# Patient Record
Sex: Male | Born: 1960 | Race: White | Hispanic: No | Marital: Single | Smoking: Former smoker
Health system: Southern US, Community
[De-identification: ages and names within clinical notes are randomized; demographics above are authoritative.]

## PROBLEM LIST (undated history)

## (undated) DIAGNOSIS — Z720 Tobacco use: Secondary | ICD-10-CM

## (undated) DIAGNOSIS — Z7289 Other problems related to lifestyle: Secondary | ICD-10-CM

## (undated) DIAGNOSIS — F1090 Alcohol use, unspecified, uncomplicated: Secondary | ICD-10-CM

## (undated) DIAGNOSIS — E785 Hyperlipidemia, unspecified: Secondary | ICD-10-CM

## (undated) DIAGNOSIS — Z789 Other specified health status: Secondary | ICD-10-CM

## (undated) DIAGNOSIS — K219 Gastro-esophageal reflux disease without esophagitis: Secondary | ICD-10-CM

## (undated) DIAGNOSIS — I739 Peripheral vascular disease, unspecified: Secondary | ICD-10-CM

## (undated) DIAGNOSIS — I10 Essential (primary) hypertension: Secondary | ICD-10-CM

## (undated) HISTORY — DX: Hyperlipidemia, unspecified: E78.5

## (undated) HISTORY — DX: Peripheral vascular disease, unspecified: I73.9

## (undated) HISTORY — DX: Gastro-esophageal reflux disease without esophagitis: K21.9

## (undated) HISTORY — DX: Tobacco use: Z72.0

## (undated) HISTORY — PX: WISDOM TOOTH EXTRACTION: SHX21

---

## 2001-04-18 ENCOUNTER — Emergency Department (HOSPITAL_COMMUNITY): Admission: EM | Admit: 2001-04-18 | Discharge: 2001-04-18 | Payer: Self-pay | Admitting: Emergency Medicine

## 2020-06-08 DIAGNOSIS — Z5189 Encounter for other specified aftercare: Secondary | ICD-10-CM

## 2020-06-08 HISTORY — DX: Encounter for other specified aftercare: Z51.89

## 2020-12-27 ENCOUNTER — Other Ambulatory Visit: Payer: Self-pay | Admitting: *Deleted

## 2020-12-27 DIAGNOSIS — I70219 Atherosclerosis of native arteries of extremities with intermittent claudication, unspecified extremity: Secondary | ICD-10-CM

## 2021-01-06 NOTE — H&P (View-Only) (Signed)
[facility] [place]  ASSESSMENT / PLAN: Brandon Lara is a 60 y.o. 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  Patient counseled patients with chronic limb threatening ischemia have an annual risk of cardiovascular mortality of 25% and a annual amputation risk of 25%. Aggressive risk factor modification and intervention for limb salvage is indicated..  Recommend the following which can slow the progression of atherosclerosis and reduce the risk of major adverse cardiac / limb events:  Complete cessation from all tobacco products. Blood glucose control with goal A1c < 7%. Blood pressure control with goal blood pressure < 140/90 mmHg. Lipid reduction therapy with goal LDL-C <100 mg/dL (<70 if symptomatic from PAD).  Aspirin '81mg'$  PO QD.  Atorvastatin 40-'80mg'$  PO QD (or other "high intensity" statin therapy).  Will get urgent CT angiogram of abdomen pelvis with runoff.  I will plan to call him with the results as soon as they are available to plan intervention.  CHIEF COMPLAINT: Bilateral leg pain  HISTORY OF PRESENT ILLNESS: Brandon Lara is a 60 y.o. male referred to clinic for evaluation of severe right lower extremity pain at rest.  He was seen by Dr. Gladstone Lighter with orthopedics who identified pulseless right leg and referred him for urgent evaluation.  The patient reports progressive symptoms of intermittent claudication which have worsened over the past several months.  He now has pain at rest.  The right leg is worse.  He has no ulceration about his lower extremities.  He is a longtime smoker.  He is otherwise healthy to the best of his knowledge.  VASCULAR SURGICAL HISTORY: none  VASCULAR RISK FACTORS: Negative history of cerebrovascular disease / stroke / transient ischemic attack. Negative history of coronary artery disease.  Negative history of diabetes mellitus.  Positive history of smoking. + actively  smoking. Negative history of hypertension.  Negative history of chronic kidney disease. Negative history of chronic obstructive pulmonary disease.  AMBULATORY STATUS: Ambulatory within the community without limits before current symptoms  Past medical history: denies Past surgical history: denies Family history: denies Social history: active smoker as above. Works as Chief Financial Officer. Medications: none Allergies: NKDA  REVIEW OF SYSTEMS:  '[X]'$  denotes positive finding, '[ ]'$  denotes negative finding Cardiac  Comments:  Chest pain or chest pressure:    Shortness of breath upon exertion:    Short of breath when lying flat:    Irregular heart rhythm:        Vascular    Pain in calf, thigh, or hip brought on by ambulation: x   Pain in feet at night that wakes you up from your sleep:  x   Blood clot in your veins:    Leg swelling:         Pulmonary    Oxygen at home:    Productive cough:     Wheezing:         Neurologic    Sudden weakness in arms or legs:     Sudden numbness in arms or legs:     Sudden onset of difficulty speaking or slurred speech:    Temporary loss of vision in one eye:     Problems with dizziness:         Gastrointestinal    Blood in stool:     Vomited blood:         Genitourinary    Burning when urinating:     Blood in urine:  Psychiatric    Major depression:         Hematologic    Bleeding problems:    Problems with blood clotting too easily:        Skin    Rashes or ulcers:        Constitutional    Fever or chills:      PHYSICAL EXAM Vitals:   01/07/21 1534  BP: 135/90  Pulse: 94  Resp: 20  Temp: 99.1 F (37.3 C)  SpO2: 96%  Weight: 128 lb (58.1 kg)  Height: '5\' 6"'$  (1.676 m)    Constitutional: well appearing. no distress. Appears marginally nourished.  Neurologic: CN intact. no focal findings. no sensory loss. Psychiatric:  Mood and affect symmetric and appropriate. Eyes:  No icterus. No conjunctival pallor. Ears,  nose, throat:  mucous membranes moist. Midline trachea.  Cardiac: regular rate and rhythm.  Respiratory:  unlabored. Abdominal:  soft, non-tender, non-distended.  Peripheral vascular: absent femoral pulses  Absent popliteal pulses  Absent pedal pulses. Extremity: no edema. no cyanosis. no pallor.  Skin: no gangrene. no ulceration.  Lymphatic: no Stemmer's sign. no palpable lymphadenopathy.  PERTINENT LABORATORY AND RADIOLOGIC DATA LOWER EXTREMITY DOPPLER STUDY   Patient Name:  Brandon Lara  Date of Exam:   01/07/2021  Medical Rec #: [id number]           Accession #:    [id number]  Date of Birth: 05-11-1961           Patient Gender: M  Patient Age:   60Y  Exam Location:  [facility]  Procedure:      VAS [place] ABI WITH/WO TBI  Referring Phys: [id number] Yevonne Aline Tisheena Maguire    ---------------------------------------------------------------------------  -----     Indications: Peripheral artery disease, and right foot cold, pale, and  numb,.   High Risk Factors: Current smoker.     Performing Technologist: Ivan Croft      Examination Guidelines: A complete evaluation includes at minimum, Doppler  waveform signals and systolic blood pressure reading at the level of  bilateral  brachial, anterior tibial, and posterior tibial arteries, when vessel  segments  are accessible. Bilateral testing is considered an integral part of a  complete  examination. Photoelectric Plethysmograph (PPG) waveforms and toe systolic  pressure readings are included as required and additional duplex testing  as  needed. Limited examinations for reoccurring indications may be performed  as  noted.      ABI Findings:  +---------+------------------+-----+----------+------------------+  Right    Rt Pressure (mmHg)IndexWaveform  Comment             +---------+------------------+-----+----------+------------------+  Brachial 163                                                   +---------+------------------+-----+----------+------------------+  PTA                             absent    unable to insonate  +---------+------------------+-----+----------+------------------+  DP       32                0.20 monophasic                    +---------+------------------+-----+----------+------------------+  Great Toe  Absent                        +---------+------------------+-----+----------+------------------+   +---------+------------------+-----+--------+----------------------------+  Left     Lt Pressure (mmHg)IndexWaveformComment                       +---------+------------------+-----+--------+----------------------------+  Brachial 120                            Brachial pressure difference  +---------+------------------+-----+--------+----------------------------+  PTA      119               0.73 biphasic                              +---------+------------------+-----+--------+----------------------------+  DP       137               0.84 biphasic                              +---------+------------------+-----+--------+----------------------------+  Great Toe120               0.74                                       +---------+------------------+-----+--------+----------------------------+   +-------+-----------+-----------+------------+------------+  ABI/TBIToday's ABIToday's TBIPrevious ABIPrevious TBI  +-------+-----------+-----------+------------+------------+  Right  0.20       0                                    +-------+-----------+-----------+------------+------------+  Left   0.84       0.74                                 +-------+-----------+-----------+------------+------------+           Summary:  Right: Resting right ankle-brachial index indicates critical limb  ischemia.   Left: Resting left ankle-brachial index indicates mild left lower   extremity arterial disease. The left toe-brachial index is normal.       *See table(s) above for measurements and observations.       Electronically signed by Monica Martinez MD on 01/07/2021 at 4:50:24 PM.  Yevonne Aline. Stanford Breed, MD [facility] Phone Number: [phone] 01/06/2021 5:03 PM

## 2021-01-06 NOTE — Progress Notes (Signed)
[facility] [place]  ASSESSMENT / PLAN: Brandon Lara is a 60 y.o. 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  Patient counseled patients with chronic limb threatening ischemia have an annual risk of cardiovascular mortality of 25% and a annual amputation risk of 25%. Aggressive risk factor modification and intervention for limb salvage is indicated..  Recommend the following which can slow the progression of atherosclerosis and reduce the risk of major adverse cardiac / limb events:  Complete cessation from all tobacco products. Blood glucose control with goal A1c < 7%. Blood pressure control with goal blood pressure < 140/90 mmHg. Lipid reduction therapy with goal LDL-C <100 mg/dL (<70 if symptomatic from PAD).  Aspirin '81mg'$  PO QD.  Atorvastatin 40-'80mg'$  PO QD (or other "high intensity" statin therapy).  Will get urgent CT angiogram of abdomen pelvis with runoff.  I will plan to call him with the results as soon as they are available to plan intervention.  CHIEF COMPLAINT: Bilateral leg pain  HISTORY OF PRESENT ILLNESS: Brandon Lara is a 60 y.o. male referred to clinic for evaluation of severe right lower extremity pain at rest.  He was seen by Dr. Gladstone Lighter with orthopedics who identified pulseless right leg and referred him for urgent evaluation.  The patient reports progressive symptoms of intermittent claudication which have worsened over the past several months.  He now has pain at rest.  The right leg is worse.  He has no ulceration about his lower extremities.  He is a longtime smoker.  He is otherwise healthy to the best of his knowledge.  VASCULAR SURGICAL HISTORY: none  VASCULAR RISK FACTORS: Negative history of cerebrovascular disease / stroke / transient ischemic attack. Negative history of coronary artery disease.  Negative history of diabetes mellitus.  Positive history of smoking. + actively  smoking. Negative history of hypertension.  Negative history of chronic kidney disease. Negative history of chronic obstructive pulmonary disease.  AMBULATORY STATUS: Ambulatory within the community without limits before current symptoms  Past medical history: denies Past surgical history: denies Family history: denies Social history: active smoker as above. Works as Chief Financial Officer. Medications: none Allergies: NKDA  REVIEW OF SYSTEMS:  '[X]'$  denotes positive finding, '[ ]'$  denotes negative finding Cardiac  Comments:  Chest pain or chest pressure:    Shortness of breath upon exertion:    Short of breath when lying flat:    Irregular heart rhythm:        Vascular    Pain in calf, thigh, or hip brought on by ambulation: x   Pain in feet at night that wakes you up from your sleep:  x   Blood clot in your veins:    Leg swelling:         Pulmonary    Oxygen at home:    Productive cough:     Wheezing:         Neurologic    Sudden weakness in arms or legs:     Sudden numbness in arms or legs:     Sudden onset of difficulty speaking or slurred speech:    Temporary loss of vision in one eye:     Problems with dizziness:         Gastrointestinal    Blood in stool:     Vomited blood:         Genitourinary    Burning when urinating:     Blood in urine:  Psychiatric    Major depression:         Hematologic    Bleeding problems:    Problems with blood clotting too easily:        Skin    Rashes or ulcers:        Constitutional    Fever or chills:      PHYSICAL EXAM Vitals:   01/07/21 1534  BP: 135/90  Pulse: 94  Resp: 20  Temp: 99.1 F (37.3 C)  SpO2: 96%  Weight: 128 lb (58.1 kg)  Height: '5\' 6"'$  (1.676 m)    Constitutional: well appearing. no distress. Appears marginally nourished.  Neurologic: CN intact. no focal findings. no sensory loss. Psychiatric:  Mood and affect symmetric and appropriate. Eyes:  No icterus. No conjunctival pallor. Ears,  nose, throat:  mucous membranes moist. Midline trachea.  Cardiac: regular rate and rhythm.  Respiratory:  unlabored. Abdominal:  soft, non-tender, non-distended.  Peripheral vascular: absent femoral pulses  Absent popliteal pulses  Absent pedal pulses. Extremity: no edema. no cyanosis. no pallor.  Skin: no gangrene. no ulceration.  Lymphatic: no Stemmer's sign. no palpable lymphadenopathy.  PERTINENT LABORATORY AND RADIOLOGIC DATA LOWER EXTREMITY DOPPLER STUDY   Patient Name:  Brandon Lara  Date of Exam:   01/07/2021  Medical Rec #: [id number]           Accession #:    [id number]  Date of Birth: 11/21/1960           Patient Gender: M  Patient Age:   60Y  Exam Location:  [facility]  Procedure:      VAS [place] ABI WITH/WO TBI  Referring Phys: [id number] Yevonne Aline Arleigh Odowd    ---------------------------------------------------------------------------  -----     Indications: Peripheral artery disease, and right foot cold, pale, and  numb,.   High Risk Factors: Current smoker.     Performing Technologist: Ivan Croft      Examination Guidelines: A complete evaluation includes at minimum, Doppler  waveform signals and systolic blood pressure reading at the level of  bilateral  brachial, anterior tibial, and posterior tibial arteries, when vessel  segments  are accessible. Bilateral testing is considered an integral part of a  complete  examination. Photoelectric Plethysmograph (PPG) waveforms and toe systolic  pressure readings are included as required and additional duplex testing  as  needed. Limited examinations for reoccurring indications may be performed  as  noted.      ABI Findings:  +---------+------------------+-----+----------+------------------+  Right    Rt Pressure (mmHg)IndexWaveform  Comment             +---------+------------------+-----+----------+------------------+  Brachial 163                                                   +---------+------------------+-----+----------+------------------+  PTA                             absent    unable to insonate  +---------+------------------+-----+----------+------------------+  DP       32                0.20 monophasic                    +---------+------------------+-----+----------+------------------+  Great Toe  Absent                        +---------+------------------+-----+----------+------------------+   +---------+------------------+-----+--------+----------------------------+  Left     Lt Pressure (mmHg)IndexWaveformComment                       +---------+------------------+-----+--------+----------------------------+  Brachial 120                            Brachial pressure difference  +---------+------------------+-----+--------+----------------------------+  PTA      119               0.73 biphasic                              +---------+------------------+-----+--------+----------------------------+  DP       137               0.84 biphasic                              +---------+------------------+-----+--------+----------------------------+  Great Toe120               0.74                                       +---------+------------------+-----+--------+----------------------------+   +-------+-----------+-----------+------------+------------+  ABI/TBIToday's ABIToday's TBIPrevious ABIPrevious TBI  +-------+-----------+-----------+------------+------------+  Right  0.20       0                                    +-------+-----------+-----------+------------+------------+  Left   0.84       0.74                                 +-------+-----------+-----------+------------+------------+           Summary:  Right: Resting right ankle-brachial index indicates critical limb  ischemia.   Left: Resting left ankle-brachial index indicates mild left lower   extremity arterial disease. The left toe-brachial index is normal.       *See table(s) above for measurements and observations.       Electronically signed by Monica Martinez MD on 01/07/2021 at 4:50:24 PM.  Yevonne Aline. Stanford Breed, MD [facility] Phone Number: [phone] 01/06/2021 5:03 PM

## 2021-01-07 ENCOUNTER — Ambulatory Visit (HOSPITAL_COMMUNITY)
Admission: RE | Admit: 2021-01-07 | Discharge: 2021-01-07 | Disposition: A | Discharge status: Home / Self Care | Payer: BC Managed Care – PPO | Source: Ambulatory Visit | Attending: Vascular Surgery | Admitting: Vascular Surgery

## 2021-01-07 ENCOUNTER — Other Ambulatory Visit: Payer: Self-pay

## 2021-01-07 ENCOUNTER — Ambulatory Visit (INDEPENDENT_AMBULATORY_CARE_PROVIDER_SITE_OTHER): Payer: BC Managed Care – PPO | Admitting: Vascular Surgery

## 2021-01-07 ENCOUNTER — Encounter: Payer: Self-pay | Admitting: Vascular Surgery

## 2021-01-07 VITALS — BP 135/90 | HR 94 | Temp 99.1°F | Resp 20 | Ht 66.0 in | Wt 128.0 lb

## 2021-01-07 DIAGNOSIS — I739 Peripheral vascular disease, unspecified: Secondary | ICD-10-CM | POA: Diagnosis not present

## 2021-01-07 DIAGNOSIS — I70219 Atherosclerosis of native arteries of extremities with intermittent claudication, unspecified extremity: Secondary | ICD-10-CM | POA: Diagnosis not present

## 2021-01-08 ENCOUNTER — Other Ambulatory Visit: Payer: Self-pay

## 2021-01-08 ENCOUNTER — Ambulatory Visit (HOSPITAL_COMMUNITY)
Admission: RE | Admit: 2021-01-08 | Discharge: 2021-01-08 | Disposition: A | Discharge status: Home / Self Care | Payer: BC Managed Care – PPO | Source: Ambulatory Visit | Attending: Vascular Surgery | Admitting: Vascular Surgery

## 2021-01-08 DIAGNOSIS — I70219 Atherosclerosis of native arteries of extremities with intermittent claudication, unspecified extremity: Secondary | ICD-10-CM

## 2021-01-08 DIAGNOSIS — I739 Peripheral vascular disease, unspecified: Secondary | ICD-10-CM

## 2021-01-08 LAB — POCT I-STAT CREATININE: Creatinine, Ser: 0.7 mg/dL (ref 0.61–1.24)

## 2021-01-08 MED ORDER — IOHEXOL 350 MG/ML SOLN
100.0000 mL | Freq: Once | INTRAVENOUS | Status: AC | PRN
  Administered 2021-01-08: 100 mL via INTRAVENOUS

## 2021-01-09 ENCOUNTER — Ambulatory Visit (INDEPENDENT_AMBULATORY_CARE_PROVIDER_SITE_OTHER): Payer: BC Managed Care – PPO | Admitting: Internal Medicine

## 2021-01-09 ENCOUNTER — Encounter: Payer: Self-pay | Admitting: Internal Medicine

## 2021-01-09 ENCOUNTER — Other Ambulatory Visit: Payer: Self-pay

## 2021-01-09 VITALS — BP 130/90 | HR 97 | Ht 66.0 in | Wt 128.6 lb

## 2021-01-09 DIAGNOSIS — F17201 Nicotine dependence, unspecified, in remission: Secondary | ICD-10-CM | POA: Insufficient documentation

## 2021-01-09 DIAGNOSIS — I739 Peripheral vascular disease, unspecified: Secondary | ICD-10-CM | POA: Insufficient documentation

## 2021-01-09 DIAGNOSIS — Z01810 Encounter for preprocedural cardiovascular examination: Secondary | ICD-10-CM

## 2021-01-09 DIAGNOSIS — Z720 Tobacco use: Secondary | ICD-10-CM | POA: Insufficient documentation

## 2021-01-09 NOTE — Progress Notes (Signed)
Cardiology Office Note:    Date:  01/09/2021   ID:  [name], DOB 02/15/1961, MRN [id number]  PCP:  Merryl Hacker, No   [facility] Providers Cardiologist:  Werner Lean, MD     Referring MD: No ref. provider found  CC: Surgery eval Consulted for the evaluation of risk stratification for Aorto fem Bypass at the behest of [facility]   History of Present Illness:    MODESTO ZABEL is a 60 y.o. male with a hx of PAD planned for aortofemoral bypass, Active tobacco use who presents for evaluation 01/09/21.  Patient notes that he is feeling fine outside of leg pain.  Has had no chest pain, chest pressure, chest tightness, chest stinging.  Discomfort occurs as leg pain- originally in his right buttocks but not has rest pain in her right leg and foot without ulcer, worsens with activity, and improves with rest.  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  No shortness of breath, DOE .  No PND or orthopnea.  No weight gain, leg swelling, or abdominal swelling.  No syncope or near syncope . Notes  no palpitations or funny heart beats.   Notes right leg claudication.  Had CT Aorta yesterday and is planned for surgery once the cardiac risks have been evaluation.  No prior stress test.  Working on cutting back smoking at least prior to surgery  No prior stress test.   No past medical history on file.  Past Surgical History:  Procedure Laterality Date   WISDOM TOOTH EXTRACTION      Current Medications: Current Meds  Medication Sig   aspirin EC 81 MG tablet Take 81 mg by mouth daily. Swallow whole.   gabapentin (NEURONTIN) 300 MG capsule Take 900 mg by mouth daily. 1 tab am, 2 tabs pm     Allergies:   Penicillins   Social History   Socioeconomic History   Marital status: Single    Spouse name: Not on file    Number of children: Not on file   Years of education: Not on file   Highest education level: Not on file  Occupational History   Not on file  Tobacco Use   Smoking status: Every Day    Packs/day: 1.50    Types: Cigarettes   Smokeless tobacco: Never  Vaping Use   Vaping Use: Never used  Substance and Sexual Activity   Alcohol use: Yes   Drug use: Not on file   Sexual activity: Not on file  Other Topics Concern   Not on file  Social History Narrative   Not on file   Social Determinants of Health   Financial Resource Strain: Not on file  Food Insecurity: Not on file  Transportation Needs: Not on file  Physical Activity: Not on file  Stress: Not on file  Social Connections: Not on file     Family History: History of coronary artery disease notable for no members. History of heart failure notable for no members. History of arrhythmia notable for no members.  ROS:   Please see the history of present illness.     All other systems reviewed and are negative.  EKGs/Labs/Other Studies Reviewed:    The following studies were reviewed today:  EKG:  EKG is  ordered today.  The ekg  ordered today demonstrates  01/09/21: SR 97 borderline criteria anterior infarct  CT Aorta: Date:01/08/21 Results: 1. Right inflow disease. 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 Occlusion of the proximal right internal iliac artery. 2. Bilateral outflow vessels are patent without significant stenosis. 3. Mild bilateral runoff disease, left side worse than right. 4. Main visceral arteries are patent without significant stenosis.  5. Aortic Atherosclerosis 6. Heart not in cardiac FOV   Recent Labs: 01/08/2021: Creatinine, Ser 0.70  Recent Lipid Panel No results found for: CHOL, TRIG, HDL, CHOLHDL, VLDL, LDLCALC, LDLDIRECT  Physical Exam:    VS:  BP 130/90   Pulse 97   Ht '5\' 6"'$  (1.676 m)   Wt 128 lb 9.6 oz (58.3 kg)   SpO2 96%   BMI 20.76 kg/m      Wt Readings from Last 3 Encounters:  01/09/21 128 lb 9.6 oz (58.3 kg)  01/07/21 128 lb (58.1 kg)     GEN:  Well nourished, well developed in no acute distress HEENT: Normal NECK: No JVD; No carotid bruits LYMPHATICS: No lymphadenopathy CARDIAC: RRR, no murmurs, rubs, gallops RESPIRATORY:  Clear to auscultation without rales, wheezing or rhonchi  ABDOMEN: Soft, non-tender, non-distended MUSCULOSKELETAL:  No edema; No deformity  SKIN: Warm and dry; no R leg skin or eschar, weak pulses NEUROLOGIC:  Alert and oriented x 3 PSYCHIATRIC:  Normal affect   ASSESSMENT:    1. PAD (peripheral artery disease) (HCC)    PLAN:    PAD Aortic Atherosclerosis Preoperative Risk Assessment - The Revised Cardiac Risk Index = 2; 6.6%: moderate risk; estimated risk of perioperative myocardial infarction, pulmonary edema, ventricular fibrillation, cardiac arrest, or complete heart block.  - DASI score of 15 associated with 4.64 functional mets - No further cardiac testing is recommended prior to surgery.  - The patient may proceed to surgery at acceptable risk.   - discuss ASA and smoking cessation prior to procedure - Our service is available as needed in the peri-operative period.    Needs post surgery follow up for primary prevention of CAD optimization  Medication Adjustments/Labs and Tests Ordered: Current medicines are reviewed at length with the patient today.  Concerns regarding medicines are outlined above.  Orders Placed This Encounter  Procedures   EKG 12-Lead    No orders of the defined types were placed in this encounter.   Patient Instructions  Medication Instructions:  Your physician recommends that you continue on your current medications as directed. Please refer to the Current Medication list given to you today.  *If you need a refill on your cardiac medications before your next appointment, please call your pharmacy*   Lab Work: NONE If you have labs (blood work)  drawn today and your tests are completely normal, you will receive your results only by: [place] (if you have MyChart) OR A paper copy in the mail If you have any lab test that is abnormal or we need to change your treatment, we will call you to review the results.   Testing/Procedures: NONE   Follow-Up: At [facility], you and your health needs are our priority.  As part of our continuing mission to provide you with exceptional heart care, we have created designated Provider Care Teams.  These Care Teams include your primary Cardiologist (physician) and Advanced Practice Providers (APPs -  Physician Assistants and Nurse Practitioners) who all work together to provide you with the care you need, when you need it.  We  recommend signing up for the patient portal called "MyChart".  Sign up information is provided on this After Visit Summary.  MyChart is used to connect with patients for Virtual Visits (Telemedicine).  Patients are able to view lab/test results, encounter notes, upcoming appointments, etc.  Non-urgent messages can be sent to your provider as well.   To learn more about what you can do with MyChart, go to [email or web address].    Your next appointment:   3 month(s)  The format for your next appointment:   In Person  Provider:   You may see Rudean Haskell, MD or one of the following Advanced Practice Providers on your designated Care Team:   Melina Copa, PA-C Ermalinda Barrios, PA-C       Signed, Werner Lean, MD  01/09/2021 7:04 PM    Jane

## 2021-01-09 NOTE — Patient Instructions (Signed)
Medication Instructions:  Your physician recommends that you continue on your current medications as directed. Please refer to the Current Medication list given to you today.  *If you need a refill on your cardiac medications before your next appointment, please call your pharmacy*   Lab Work: NONE If you have labs (blood work) drawn today and your tests are completely normal, you will receive your results only by: [place] (if you have MyChart) OR A paper copy in the mail If you have any lab test that is abnormal or we need to change your treatment, we will call you to review the results.   Testing/Procedures: NONE   Follow-Up: At [facility], you and your health needs are our priority.  As part of our continuing mission to provide you with exceptional heart care, we have created designated Provider Care Teams.  These Care Teams include your primary Cardiologist (physician) and Advanced Practice Providers (APPs -  Physician Assistants and Nurse Practitioners) who all work together to provide you with the care you need, when you need it.  We recommend signing up for the patient portal called "MyChart".  Sign up information is provided on this After Visit Summary.  MyChart is used to connect with patients for Virtual Visits (Telemedicine).  Patients are able to view lab/test results, encounter notes, upcoming appointments, etc.  Non-urgent messages can be sent to your provider as well.   To learn more about what you can do with MyChart, go to [email or web address].    Your next appointment:   3 month(s)  The format for your next appointment:   In Person  Provider:   You may see Rudean Haskell, MD or one of the following Advanced Practice Providers on your designated Care Team:   Melina Copa, PA-C Ermalinda Barrios, PA-C

## 2021-01-14 ENCOUNTER — Other Ambulatory Visit: Payer: Self-pay

## 2021-01-17 NOTE — Progress Notes (Signed)
Surgical Instructions    Your procedure is scheduled on Thursday, August 18th, 2022.   Report to [facility] Main Entrance "A" at 05:30 A.M., then check in with the Admitting office.  Call this number if you have problems the morning of surgery:  [phone]   If you have any questions prior to your surgery date call [phone]: Open Monday-Friday 8am-4pm    Remember:  Do not eat or drink after midnight the night before your surgery    Take these medicines the morning of surgery with A SIP OF WATER:  gabapentin (NEURONTIN)  Follow your surgeon's instructions on when to stop Aspirin.  If no instructions were given by your surgeon then you will need to call the office to get those instructions.     As of today, STOP taking any Aspirin (unless otherwise instructed by your surgeon) Aleve, Naproxen, Ibuprofen, Motrin, Advil, Goody's, BC's, all herbal medications, fish oil, and all vitamins.          Do not wear jewelry  Do not wear lotions, powders, colognes, or deodorant. Men may shave face and neck. Do not bring valuables to the hospital. DO Not wear nail polish, gel polish, artificial nails, or any other type of covering on natural nails including finger and toenails. If patients have artificial nails, gel coating, etc. that need to be removed by a nail salon please have this removed prior to surgery or surgery may need to be canceled/delayed if the surgeon/ anesthesia feels like the patient is unable to be adequately monitored.             [facility] is not responsible for any belongings or valuables.  Do NOT Smoke (Tobacco/Vaping) or drink Alcohol 24 hours prior to your procedure If you use a CPAP at night, you may bring all equipment for your overnight stay.   Contacts, glasses, dentures or bridgework may not be worn into surgery, please bring cases for these belongings   For patients admitted to the hospital, discharge time will be determined by your treatment team.    Patients discharged the day of surgery will not be allowed to drive home, and someone needs to stay with them for 24 hours.  ONLY 1 SUPPORT PERSON MAY BE PRESENT WHILE YOU ARE IN SURGERY. IF YOU ARE TO BE ADMITTED ONCE YOU ARE IN YOUR ROOM YOU WILL BE ALLOWED TWO (2) VISITORS.  Minor children may have two parents present. Special consideration for safety and communication needs will be reviewed on a case by case basis.  Special instructions:    Oral Hygiene is also important to reduce your risk of infection.  Remember - BRUSH YOUR TEETH THE MORNING OF SURGERY WITH YOUR REGULAR TOOTHPASTE   [place]- Preparing For Surgery  Before surgery, you can play an important role. Because skin is not sterile, your skin needs to be as free of germs as possible. You can reduce the number of germs on your skin by washing with CHG (chlorahexidine gluconate) Soap before surgery.  CHG is an antiseptic cleaner which kills germs and bonds with the skin to continue killing germs even after washing.     Please do not use if you have an allergy to CHG or antibacterial soaps. If your skin becomes reddened/irritated stop using the CHG.  Do not shave (including legs and underarms) for at least 48 hours prior to first CHG shower. It is OK to shave your face.  Please follow these instructions carefully.     Shower  the NIGHT BEFORE SURGERY and the MORNING OF SURGERY with CHG Soap.   If you chose to wash your hair, wash your hair first as usual with your normal shampoo. After you shampoo, rinse your hair and body thoroughly to remove the shampoo.  Then [employer] and genitals (private parts) with your normal soap and rinse thoroughly to remove soap.  After that Use CHG Soap as you would any other liquid soap. You can apply CHG directly to the skin and wash gently with a scrungie or a clean washcloth.   Apply the CHG Soap to your body ONLY FROM THE NECK DOWN.  Do not use on open wounds or open sores. Avoid contact  with your eyes, ears, mouth and genitals (private parts). Wash Face and genitals (private parts)  with your normal soap.   Wash thoroughly, paying special attention to the area where your surgery will be performed.  Thoroughly rinse your body with warm water from the neck down.  DO NOT shower/wash with your normal soap after using and rinsing off the CHG Soap.  Pat yourself dry with a CLEAN TOWEL.  Wear CLEAN PAJAMAS to bed the night before surgery  Place CLEAN SHEETS on your bed the night before your surgery  DO NOT SLEEP WITH PETS.   Day of Surgery:  Take a shower with CHG soap. Wear Clean/Comfortable clothing the morning of surgery Do not apply any deodorants/lotions.   Remember to brush your teeth WITH YOUR REGULAR TOOTHPASTE.   Please read over the following fact sheets that you were given.

## 2021-01-20 ENCOUNTER — Other Ambulatory Visit: Payer: Self-pay | Admitting: Vascular Surgery

## 2021-01-20 ENCOUNTER — Encounter (HOSPITAL_COMMUNITY): Payer: Self-pay | Admitting: Vascular Surgery

## 2021-01-20 ENCOUNTER — Encounter (HOSPITAL_COMMUNITY): Payer: Self-pay

## 2021-01-20 ENCOUNTER — Other Ambulatory Visit: Payer: Self-pay

## 2021-01-20 ENCOUNTER — Encounter (HOSPITAL_COMMUNITY)
Admission: RE | Admit: 2021-01-20 | Discharge: 2021-01-20 | Disposition: A | Discharge status: Home / Self Care | Payer: BC Managed Care – PPO | Source: Ambulatory Visit | Attending: Vascular Surgery | Admitting: Vascular Surgery

## 2021-01-20 DIAGNOSIS — I739 Peripheral vascular disease, unspecified: Secondary | ICD-10-CM | POA: Diagnosis not present

## 2021-01-20 DIAGNOSIS — Z7289 Other problems related to lifestyle: Secondary | ICD-10-CM | POA: Diagnosis not present

## 2021-01-20 DIAGNOSIS — I7409 Other arterial embolism and thrombosis of abdominal aorta: Secondary | ICD-10-CM | POA: Insufficient documentation

## 2021-01-20 DIAGNOSIS — Z01812 Encounter for preprocedural laboratory examination: Secondary | ICD-10-CM | POA: Insufficient documentation

## 2021-01-20 DIAGNOSIS — F17210 Nicotine dependence, cigarettes, uncomplicated: Secondary | ICD-10-CM | POA: Diagnosis not present

## 2021-01-20 DIAGNOSIS — Z7982 Long term (current) use of aspirin: Secondary | ICD-10-CM | POA: Diagnosis not present

## 2021-01-20 DIAGNOSIS — Z79899 Other long term (current) drug therapy: Secondary | ICD-10-CM | POA: Diagnosis not present

## 2021-01-20 HISTORY — DX: Other specified health status: Z78.9

## 2021-01-20 HISTORY — DX: Other problems related to lifestyle: Z72.89

## 2021-01-20 HISTORY — DX: Alcohol use, unspecified, uncomplicated: F10.90

## 2021-01-20 LAB — URINALYSIS, ROUTINE W REFLEX MICROSCOPIC
Bilirubin Urine: NEGATIVE
Glucose, UA: 50 mg/dL — AB
Hgb urine dipstick: NEGATIVE
Ketones, ur: NEGATIVE mg/dL
Leukocytes,Ua: NEGATIVE
Nitrite: NEGATIVE
Protein, ur: NEGATIVE mg/dL
Specific Gravity, Urine: 1.019 (ref 1.005–1.030)
pH: 5 (ref 5.0–8.0)

## 2021-01-20 LAB — COMPREHENSIVE METABOLIC PANEL
ALT: 16 U/L (ref 0–44)
AST: 17 U/L (ref 15–41)
Albumin: 3.9 g/dL (ref 3.5–5.0)
Alkaline Phosphatase: 87 U/L (ref 38–126)
Anion gap: 12 (ref 5–15)
BUN: 7 mg/dL (ref 6–20)
CO2: 20 mmol/L — ABNORMAL LOW (ref 22–32)
Calcium: 9 mg/dL (ref 8.9–10.3)
Chloride: 104 mmol/L (ref 98–111)
Creatinine, Ser: 0.71 mg/dL (ref 0.61–1.24)
GFR, Estimated: >60 mL/min (ref >60)
Glucose, Bld: 80 mg/dL (ref 70–99)
Potassium: 3.8 mmol/L (ref 3.5–5.1)
Sodium: 136 mmol/L (ref 135–145)
Total Bilirubin: 0.7 mg/dL (ref 0.3–1.2)
Total Protein: 6.7 g/dL (ref 6.5–8.1)

## 2021-01-20 LAB — CBC
HCT: 49.5 % (ref 39.0–52.0)
Hemoglobin: 16.9 g/dL (ref 13.0–17.0)
MCH: 33 pg (ref 26.0–34.0)
MCHC: 34.1 g/dL (ref 30.0–36.0)
MCV: 96.7 fL (ref 80.0–100.0)
Platelets: 601 10*3/uL — ABNORMAL HIGH (ref 150–400)
RBC: 5.12 MIL/uL (ref 4.22–5.81)
RDW: 14.3 % (ref 11.5–15.5)
WBC: 18 10*3/uL — ABNORMAL HIGH (ref 4.0–10.5)
nRBC: 0 % (ref 0.0–0.2)

## 2021-01-20 LAB — TYPE AND SCREEN
ABO/RH(D): O POS
Antibody Screen: NEGATIVE

## 2021-01-20 LAB — BLOOD GAS, ARTERIAL
Acid-base deficit: 1 mmol/L (ref 0.0–2.0)
Bicarbonate: 23.1 mmol/L (ref 20.0–28.0)
Drawn by: 58793
FIO2: 21
O2 Saturation: 97.1 %
Patient temperature: 37
pCO2 arterial: 37.6 mmHg (ref 32.0–48.0)
pH, Arterial: 7.405 (ref 7.350–7.450)
pO2, Arterial: 91.4 mmHg (ref 83.0–108.0)

## 2021-01-20 LAB — APTT: aPTT: 30 seconds (ref 24–36)

## 2021-01-20 LAB — SURGICAL PCR SCREEN
MRSA, PCR: NEGATIVE
Staphylococcus aureus: NEGATIVE

## 2021-01-20 LAB — PROTIME-INR
INR: 1.1 (ref 0.8–1.2)
Prothrombin Time: 13.7 seconds (ref 11.4–15.2)

## 2021-01-20 NOTE — Pre-Procedure Instructions (Signed)
Surgical Instructions    Your procedure is scheduled on Thursday, August 18th, 2022.   Report to [facility] Main Entrance "A" at 05:30 A.M., then check in with the Admitting office.  Call this number if you have problems the morning of surgery:  [phone]   If you have any questions prior to your surgery date call [phone]: Open Monday-Friday 8am-4pm    Remember:  Do not eat or drink after midnight the night before your surgery    Take these medicines the morning of surgery with A SIP OF WATER:  aspirin EC  gabapentin (NEURONTIN)  As of today, STOP taking any Aleve, Naproxen, Ibuprofen, Motrin, Advil, Goody's, BC's, all herbal medications, fish oil, and all vitamins.          Do not wear jewelry  Do not wear lotions, powders, colognes, or deodorant. Men may shave face and neck. Do not bring valuables to the hospital.            [facility] is not responsible for any belongings or valuables.  Do NOT Smoke (Tobacco/Vaping) or drink Alcohol 24 hours prior to your procedure If you use a CPAP at night, you may bring all equipment for your overnight stay.   Contacts, glasses, dentures or bridgework may not be worn into surgery, please bring cases for these belongings   For patients admitted to the hospital, discharge time will be determined by your treatment team.   Patients discharged the day of surgery will not be allowed to drive home, and someone needs to stay with them for 24 hours.  ONLY 1 SUPPORT PERSON MAY BE PRESENT WHILE YOU ARE IN SURGERY. IF YOU ARE TO BE ADMITTED ONCE YOU ARE IN YOUR ROOM YOU WILL BE ALLOWED TWO (2) VISITORS.  Minor children may have two parents present. Special consideration for safety and communication needs will be reviewed on a case by case basis.  Special instructions:    Oral Hygiene is also important to reduce your risk of infection.  Remember - BRUSH YOUR TEETH THE MORNING OF SURGERY WITH YOUR REGULAR TOOTHPASTE   [place]-  Preparing For Surgery  Before surgery, you can play an important role. Because skin is not sterile, your skin needs to be as free of germs as possible. You can reduce the number of germs on your skin by washing with CHG (chlorahexidine gluconate) Soap before surgery.  CHG is an antiseptic cleaner which kills germs and bonds with the skin to continue killing germs even after washing.     Please do not use if you have an allergy to CHG or antibacterial soaps. If your skin becomes reddened/irritated stop using the CHG.  Do not shave (including legs and underarms) for at least 48 hours prior to first CHG shower. It is OK to shave your face.  Please follow these instructions carefully.     Shower the NIGHT BEFORE SURGERY and the MORNING OF SURGERY with CHG Soap.   If you chose to wash your hair, wash your hair first as usual with your normal shampoo. After you shampoo, rinse your hair and body thoroughly to remove the shampoo.  Then [employer] and genitals (private parts) with your normal soap and rinse thoroughly to remove soap.  After that Use CHG Soap as you would any other liquid soap. You can apply CHG directly to the skin and wash gently with a scrungie or a clean washcloth.   Apply the CHG Soap to your body ONLY FROM THE NECK  DOWN.  Do not use on open wounds or open sores. Avoid contact with your eyes, ears, mouth and genitals (private parts). Wash Face and genitals (private parts)  with your normal soap.   Wash thoroughly, paying special attention to the area where your surgery will be performed.  Thoroughly rinse your body with warm water from the neck down.  DO NOT shower/wash with your normal soap after using and rinsing off the CHG Soap.  Pat yourself dry with a CLEAN TOWEL.  Wear CLEAN PAJAMAS to bed the night before surgery  Place CLEAN SHEETS on your bed the night before your surgery  DO NOT SLEEP WITH PETS.   Day of Surgery:  Take a shower with CHG soap. Wear  Clean/Comfortable clothing the morning of surgery Do not apply any deodorants/lotions.   Remember to brush your teeth WITH YOUR REGULAR TOOTHPASTE.   Please read over the following fact sheets that you were given.

## 2021-01-20 NOTE — Progress Notes (Signed)
Anesthesia PAT Evaluation:  Case: [id number] Date/Time: 01/23/21 0716   Procedure: AORTA BIFEMORAL BYPASS GRAFT (Bilateral)   Anesthesia type: General   Pre-op diagnosis: aortoiliac occlusive disease   Location: [facility] OR ROOM 11 / [facility]   Surgeons: Cherre Robins, MD       DISCUSSION: Patient is a 60 year old male scheduled for the above procedure. He was referred to Dr. Stanford Breed for RLE rest pain and non-palpable RLE pulses by orthopedic physician Latanya Maudlin, MD.    History includes smoking, PAD.  He reports drinking 6-7 beers per day.  He says he recently decreased smoking to 1/4 to 1/2 pack/day.   Preoperative cardiology evaluation 01/09/2021 by Rudean Haskell, MD.  "Preoperative Risk Assessment - The Revised Cardiac Risk Index = 2; 6.6%: moderate risk; estimated risk of perioperative myocardial infarction, pulmonary edema, ventricular fibrillation, cardiac arrest, or complete heart block.  - DASI score of 15 associated with 4.64 functional mets - No further cardiac testing is recommended prior to surgery.  - The patient may proceed to surgery at acceptable risk.   - discuss ASA and smoking cessation prior to procedure - Our service is available as needed in the peri-operative period.     Needs post surgery follow up for primary prevention of CAD optimization".  Patient evaluated at his 01/20/21 PAT visit due to elevated BP and alcohol use. His BP and HR elevated on presentation to 01/20/21 PAT visit. He reported feeling stressed because he does not typically drive on [facility]; however subsequent readings were also elevated but a little improved on manual BP recheck prior to leaving PAT. Results from the automated BP machine (LUE per patient's account) were: BP 199/122, HR 114 BP 167/100, HR 107 BP 205/108, HR 90 (after ABG) BP 207/99 BP 182/98 (manual RUE. Brachial pulse 2+). BP 162/100 (manual LUE. Brachial pulse 1+ No hematoma noted at site of ABG.)  Previous BP  readings 135/90 on 01/07/21 ([facility]) and 130/90 01/09/21 ([facility]). He reported BP on 01/09/21 was a manual reading. He states that he has not seen a PCP in years. 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 He wants to eventually get established with [facility] since this is near to his house and pharmacy. He denied chest pain, SOB, edema, syncope, palpitations. No headaches or blurred vision. He is independent and tries to stay active but has RLE limiting claudication. Denied foot ulcerations. 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   He reports regular alcohol intake of ~ 6-7 beers/day. He has done this for years. He does not feel particularly intoxicated with this level of consumption, but notes he does not drive after drinking. He denied known history of withdrawal, but admits he has not had to refrain from alcohol use in the past. He had told his PAT RN that he was planning to stop drinking cold [place] at least for his surgery (last drink 01/19/21 ~ 9:15 PM), but I advised against that since he may be prone to withdrawals given his regular daily intake. Discussed it would be better to discuss alcohol treatment options once he gets established with a PCP in the near future. He voiced understanding of not stopping alcohol all to together before surgery on his own. He is also trying to cut down on cigarettes and is down to < 1/2 PPD from 1 - 1 1/2 PPD  in the last week.   Preoperative labs showed normal UA except glucose 50--with no known history of DM and normal glucose of 80 with PAT labs. Creatinine and LFTs WNL. His WBC is elevated at 18.0K and PLT count 601K with high normal H/H 16.9/49.5. He does not recall when he had a CBC last, and his previous PCP office is permanently closed. He thinks he may have had an elevated WBC in the past, but can't be for sure. He finished a  steroid taper for right sciatica ~ 2 weeks ago. He denied fever, cough, chills, diarrhea, dysuria, rash, foot ulcer, or other signs of infection. Lungs sounds were clear, but diminished throughout.  He mentions that Dr. Stanford Breed recommended statin and ASA therapy for PAT, but he was not given any prescriptions. He did start an 81 mg ASA, but held it for surgery at the suggestion of his sister who is in the medical field. Discussed that [facility] surgeon's do not routinely hold ASA for surgery.   Discussed above events with anesthesiologist Oren Bracket, MD. Will reach out to cardiology about his elevated BP readings at his PAT visit. He will try to get his BP rechecked at his mother's house on 01/21/21 and let me know the results. I will also notify Dr. Stanford Breed about elevated WBC 18K. No obvious concern of infection based on patient interview. Advised patient that with elevated WBC, Dr. Roselie Awkward may want labs repeated or further evaluated.   01/20/21 presurgical COVID-19 test is in process.    VS: BP (!) 162/100 Comment: manual LUE  Pulse 90   Temp 37.2 C (Oral)   Resp 18   Ht '5\' 6"'$  (1.676 m)   Wt 57.7 kg   SpO2 99%   BMI 20.53 kg/m  Provider wore N95 and universal face mask. Patient wore face mask. Heart RRR, no murmur. Lungs clear but diminished throughout.     PROVIDERS: Pcp, No   LABS: Preoperative labs noted. See DISCUSSION. (all labs ordered are listed, but only abnormal results are displayed)  Labs Reviewed  CBC - Abnormal; Notable for the following components:      Result Value   WBC 18.0 (*)    Platelets 601 (*)    All other components within normal limits  COMPREHENSIVE METABOLIC PANEL - Abnormal; Notable for the following components:   CO2 20 (*)    All other components within normal limits  URINALYSIS, ROUTINE W REFLEX MICROSCOPIC - Abnormal; Notable for the following components:   Glucose, UA 50 (*)    All other components within normal limits  BLOOD GAS, ARTERIAL -  Abnormal; Notable for the following components:   Allens test (pass/fail) BRACHIAL ARTERY (*)    All other components within normal limits  SURGICAL PCR SCREEN  PROTIME-INR  APTT  TYPE AND SCREEN     IMAGES: CTA Ao+BiFem 01/08/21: IMPRESSION: VASCULAR   1. Right inflow disease. 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 Occlusion of the proximal right internal iliac artery. 2. Bilateral outflow vessels are patent without significant stenosis. 3. Mild bilateral runoff disease, left side worse than right. 4. Main visceral arteries are patent without significant stenosis.   NON-VASCULAR   1. Colonic diverticulosis. 2. No acute abnormality involving the nonvascular structures.    EKG: 01/09/21: SR at 97 bpm   CV:  BLE Arterial US/ABI's 01/07/21: Summary:  Right: Resting right ankle-brachial index indicates critical limb  ischemia.   Left: Resting left ankle-brachial index indicates mild  left lower  extremity arterial disease. The left toe-brachial index is normal.    Past Medical History:  Diagnosis Date   Alcohol use    6-7 beers/day (as of 01/21/21)   PAD (peripheral artery disease) (HCC)    Tobacco abuse     Past Surgical History:  Procedure Laterality Date   WISDOM TOOTH EXTRACTION      MEDICATIONS:  aspirin EC 81 MG tablet   gabapentin (NEURONTIN) 300 MG capsule   No current facility-administered medications for this encounter.     Myra Gianotti, PA-C Surgical Short Stay/Anesthesiology [facility] Phone [phone] [facility] Phone [phone] 01/21/2021 12:49 AM

## 2021-01-20 NOTE — Progress Notes (Addendum)
PCP - denies Cardiologist - Dr. Gasper Sells   Chest x-ray -n/a  EKG - 01/09/21 Stress Test - denies ECHO - denies Cardiac Cath - denies  Sleep Study - denies CPAP - denies  Blood Thinner Instructions: n/a Aspirin Instructions: pt stated that he has held ASA for 2 weeks because he wasn't sure if he was supposed to hold it or not.   COVID TEST- Pt stated that he was tested at [facility] this morning prior to PAT appointment.  Anesthesia review: Yes, cardiac clearance 01/09/21. HBP readings in PAT; Myra Gianotti, PA-C aware.   Patient denies shortness of breath, fever, cough and chest pain at PAT appointment   All instructions explained to the patient, with a verbal understanding of the material. Patient agrees to go over the instructions while at home for a better understanding. Patient also instructed to self quarantine after being tested for COVID-19. The opportunity to ask questions was provided.

## 2021-01-21 ENCOUNTER — Other Ambulatory Visit: Payer: Self-pay | Admitting: Vascular Surgery

## 2021-01-21 ENCOUNTER — Ambulatory Visit: Payer: BC Managed Care – PPO | Admitting: Internal Medicine

## 2021-01-21 ENCOUNTER — Ambulatory Visit (HOSPITAL_COMMUNITY)
Admission: RE | Admit: 2021-01-21 | Discharge: 2021-01-21 | Disposition: A | Discharge status: Home / Self Care | Payer: BC Managed Care – PPO | Source: Ambulatory Visit | Attending: Student in an Organized Health Care Education/Training Program | Admitting: Student in an Organized Health Care Education/Training Program

## 2021-01-21 ENCOUNTER — Encounter: Payer: BC Managed Care – PPO | Admitting: Internal Medicine

## 2021-01-21 ENCOUNTER — Encounter (HOSPITAL_COMMUNITY): Payer: Self-pay

## 2021-01-21 VITALS — BP 152/90 | HR 86 | Temp 98.8°F | Ht 66.0 in | Wt 126.8 lb

## 2021-01-21 DIAGNOSIS — D75839 Thrombocytosis, unspecified: Secondary | ICD-10-CM

## 2021-01-21 DIAGNOSIS — R030 Elevated blood-pressure reading, without diagnosis of hypertension: Secondary | ICD-10-CM

## 2021-01-21 DIAGNOSIS — D72829 Elevated white blood cell count, unspecified: Secondary | ICD-10-CM

## 2021-01-21 DIAGNOSIS — Z131 Encounter for screening for diabetes mellitus: Secondary | ICD-10-CM | POA: Diagnosis not present

## 2021-01-21 DIAGNOSIS — Z720 Tobacco use: Secondary | ICD-10-CM | POA: Diagnosis not present

## 2021-01-21 DIAGNOSIS — J439 Emphysema, unspecified: Secondary | ICD-10-CM

## 2021-01-21 DIAGNOSIS — Z0000 Encounter for general adult medical examination without abnormal findings: Secondary | ICD-10-CM

## 2021-01-21 DIAGNOSIS — F17201 Nicotine dependence, unspecified, in remission: Secondary | ICD-10-CM

## 2021-01-21 DIAGNOSIS — I739 Peripheral vascular disease, unspecified: Secondary | ICD-10-CM

## 2021-01-21 DIAGNOSIS — I700 Atherosclerosis of aorta: Secondary | ICD-10-CM

## 2021-01-21 LAB — SARS CORONAVIRUS 2 (TAT 6-24 HRS): SARS Coronavirus 2: NEGATIVE

## 2021-01-21 LAB — POCT GLYCOSYLATED HEMOGLOBIN (HGB A1C): Hemoglobin A1C: 5.4 % (ref 4.0–5.6)

## 2021-01-21 MED ORDER — VARENICLINE TARTRATE 0.5 MG PO TABS: ORAL_TABLET | ORAL | 0 refills | Status: AC

## 2021-01-21 MED ORDER — VARENICLINE TARTRATE 1 MG PO TABS
1.0000 mg | ORAL_TABLET | Freq: Two times a day (BID) | ORAL | 2 refills | Status: AC
Start: 2021-01-28 — End: 2021-04-15

## 2021-01-21 NOTE — Patient Instructions (Signed)
Thank you, [name] for allowing [place] to provide your care today. Today we discussed elevated blood count, tobacco use, elevated blood pressure, screening for cardiovascular disease.    Labs Ordered:  Lab Orders         Lipid Profile         CBC with Diff         Pathologist smear review         POC Hbg A1C      Tests Ordered: Orders Placed This Encounter  Procedures   DG Chest 2 View         Referrals Ordered:  Referral Orders  No referral(s) requested today     Medication Changes:  There are no discontinued medications.   No orders of the defined types were placed in this encounter.    Health Maintenance Screening: There are no preventive care reminders to display for this patient.   Instructions:   Follow up:  1 week for blood pressure check    Remember: If you have any questions or concerns, call our clinic at [phone] or after hours call [phone] and ask for the internal medicine resident on call.  [name], D.O. [facility]

## 2021-01-21 NOTE — Progress Notes (Signed)
Patient with unexplained leukocytosis on preoperative evaluation (18k). No differential sent. No constitutional symptoms of infection for PA Zelenak or myself on recent evaluations. No evidence of urinary tract infection on recent urinalysis. CT angiogram of abdomen / pelvis with runoff shows no splenomegaly or evidence of intra-abdominal infection. His other cell lines are increased on CBC so this may be spurious, but I think further evaluation is warranted before replacing his distal aorta with Dacron. Will order a repeat CBC with differential, peripheral blood smear, ESR, CRP, and chest x-ray. Will refer to internal medicine. He does have limb threatening ischemia, so this will need to be expedited.   Yevonne Aline. Stanford Breed, MD [facility] Phone Number: [phone] 01/21/2021 9:22 AM

## 2021-01-21 NOTE — H&P (View-Only) (Signed)
Patient with unexplained leukocytosis on preoperative evaluation (18k). No differential sent. No constitutional symptoms of infection for PA Zelenak or myself on recent evaluations. No evidence of urinary tract infection on recent urinalysis. CT angiogram of abdomen / pelvis with runoff shows no splenomegaly or evidence of intra-abdominal infection. His other cell lines are increased on CBC so this may be spurious, but I think further evaluation is warranted before replacing his distal aorta with Dacron. Will order a repeat CBC with differential, peripheral blood smear, ESR, CRP, and chest x-ray. Will refer to internal medicine. He does have limb threatening ischemia, so this will need to be expedited.   Brandon Lara N. Brandon Ragon, MD [facility] [place] Office Phone Number: [phone] 01/21/2021 9:22 AM  

## 2021-01-21 NOTE — Progress Notes (Addendum)
CC: leukocytosis   HPI:  [name] is a 60 y.o. male with a past medical history stated below and presents today for leukocytosis. Please see problem based assessment and plan for additional details.  Past Medical History:  Diagnosis Date   Alcohol use    6-7 beers/day (as of 01/21/21)   PAD (peripheral artery disease) (HCC)    Tobacco abuse     Current Outpatient Medications on File Prior to Visit  Medication Sig Dispense Refill   aspirin EC 81 MG tablet Take 81 mg by mouth daily. Swallow whole.     gabapentin (NEURONTIN) 300 MG capsule Take 300-600 mg by mouth See admin instructions. Take 1 capsule (300 mg) by mouth in the morning & take 2 capsules (600 mg) by mouth in the evening.     No current facility-administered medications on file prior to visit.    Family History  Problem Relation Age of Onset   Kidney cancer Mother     Social History   Socioeconomic History   Marital status: Single    Spouse name: Not on file   Number of children: Not on file   Years of education: Not on file   Highest education level: Not on file  Occupational History   Not on file  Tobacco Use   Smoking status: Every Day    Packs/day: 1.00    Types: Cigarettes   Smokeless tobacco: Never   Tobacco comments:    Trying to quit. Down to 1/2 to 3/4 pack. (01/21/21)  Vaping Use   Vaping Use: Never used  Substance and Sexual Activity   Alcohol use: Yes    Alcohol/week: 49.0 standard drinks    Types: 49 Cans of beer per week    Comment: 6-7 beers a day (01/21/21)   Drug use: Never   Sexual activity: Not on file  Other Topics Concern   Not on file  Social History Narrative   Not on file   Social Determinants of Health   Financial Resource Strain: Not on file  Food Insecurity: Not on file  Transportation Needs: Not on file  Physical Activity: Not on file  Stress: Not on file  Social Connections: Not on file  Intimate Partner Violence: Not on file    Review of  Systems: ROS negative except for what is noted on the assessment and plan.  Vitals:   01/21/21 1514 01/21/21 1548  BP: (!) 181/100 (!) 152/90  Pulse: (!) 110 86  Temp: 98.8 F (37.1 C)   TempSrc: Oral   SpO2: 98%   Weight: 126 lb 12.8 oz (57.5 kg)   Height: '5\' 6"'$  (1.676 m)     Physical Exam: Gen: A&O x3 and in no apparent distress, well appearing and nourished. HEENT: Head - normocephalic, atraumatic. Eye -  visual acuity grossly intact, conjunctiva clear, sclera non-icteric, EOM intact. Mouth - No obvious caries or periodontal disease. Neck: no obvious masses or nodules, AROM intact. CV: RRR, 2/6 systolic murmur best heard at LUSB, S1/S2 presents  Resp: Clear to ascultation bilaterally  Abd: BS (+) x4, soft, non-tender, without obvious hepatosplenomegaly or masses MSK: Grossly normal AROM and Rt lower extremity weakness with flexion at hip.  Skin: good skin turgor, no rashes, unusual bruising, or prominent lesions. Rt. lower extremity cool to the touch. Pale legs with elevation, shin skin and decreased hair growth. Clubbing of bilateral fingers.  Neuro: No focal deficits, grossly normal sensation and coordination.  Psych: Oriented x3 and responding appropriately.  Intact recent and remote memory, normal mood, judgement, affect , and insight.    Assessment & Plan:   See Encounters Tab for problem based charting.  Patient discussed with Dr. Karie Schwalbe, D.O. [facility], PGY-3 Pager: [phone], Phone: [phone] Date 01/22/2021 Time 6:42 AM

## 2021-01-22 ENCOUNTER — Encounter: Payer: Self-pay | Admitting: Internal Medicine

## 2021-01-22 ENCOUNTER — Telehealth: Payer: Self-pay

## 2021-01-22 ENCOUNTER — Telehealth: Payer: Self-pay | Admitting: Internal Medicine

## 2021-01-22 DIAGNOSIS — I700 Atherosclerosis of aorta: Secondary | ICD-10-CM | POA: Insufficient documentation

## 2021-01-22 DIAGNOSIS — D72829 Elevated white blood cell count, unspecified: Secondary | ICD-10-CM | POA: Insufficient documentation

## 2021-01-22 DIAGNOSIS — D473 Essential (hemorrhagic) thrombocythemia: Secondary | ICD-10-CM | POA: Insufficient documentation

## 2021-01-22 DIAGNOSIS — D75839 Thrombocytosis, unspecified: Secondary | ICD-10-CM | POA: Insufficient documentation

## 2021-01-22 DIAGNOSIS — Z79899 Other long term (current) drug therapy: Secondary | ICD-10-CM | POA: Insufficient documentation

## 2021-01-22 DIAGNOSIS — J449 Chronic obstructive pulmonary disease, unspecified: Secondary | ICD-10-CM | POA: Insufficient documentation

## 2021-01-22 DIAGNOSIS — Z0000 Encounter for general adult medical examination without abnormal findings: Secondary | ICD-10-CM | POA: Insufficient documentation

## 2021-01-22 DIAGNOSIS — R030 Elevated blood-pressure reading, without diagnosis of hypertension: Secondary | ICD-10-CM | POA: Insufficient documentation

## 2021-01-22 DIAGNOSIS — I10 Essential (primary) hypertension: Secondary | ICD-10-CM | POA: Insufficient documentation

## 2021-01-22 LAB — CBC WITH DIFFERENTIAL/PLATELET
Basophils Absolute: 0.2 10*3/uL (ref 0.0–0.2)
Basos: 1 %
EOS (ABSOLUTE): 0.4 10*3/uL (ref 0.0–0.4)
Eos: 2 %
Hematocrit: 49.4 % (ref 37.5–51.0)
Hemoglobin: 17.3 g/dL (ref 13.0–17.7)
Immature Grans (Abs): 0.6 10*3/uL — ABNORMAL HIGH (ref 0.0–0.1)
Immature Granulocytes: 3 %
Lymphocytes Absolute: 4.2 10*3/uL — ABNORMAL HIGH (ref 0.7–3.1)
Lymphs: 22 %
MCH: 32.7 pg (ref 26.6–33.0)
MCHC: 35 g/dL (ref 31.5–35.7)
MCV: 93 fL (ref 79–97)
Monocytes Absolute: 1.8 10*3/uL — ABNORMAL HIGH (ref 0.1–0.9)
Monocytes: 9 %
Neutrophils Absolute: 11.9 10*3/uL — ABNORMAL HIGH (ref 1.4–7.0)
Neutrophils: 63 %
Platelets: 644 10*3/uL — ABNORMAL HIGH (ref 150–450)
RBC: 5.29 x10E6/uL (ref 4.14–5.80)
RDW: 13.6 % (ref 11.6–15.4)
WBC: 19.1 10*3/uL — ABNORMAL HIGH (ref 3.4–10.8)

## 2021-01-22 LAB — LIPID PANEL
Chol/HDL Ratio: 3.1 ratio (ref 0.0–5.0)
Cholesterol, Total: 171 mg/dL (ref 100–199)
HDL: 55 mg/dL (ref >39)
LDL Chol Calc (NIH): 102 mg/dL — ABNORMAL HIGH (ref 0–99)
Triglycerides: 75 mg/dL (ref 0–149)
VLDL Cholesterol Cal: 14 mg/dL (ref 5–40)

## 2021-01-22 MED ORDER — HYDROCHLOROTHIAZIDE 12.5 MG PO CAPS: 12.5000 mg | ORAL_CAPSULE | Freq: Every day | ORAL | 3 refills | Status: DC

## 2021-01-22 MED ORDER — ROSUVASTATIN CALCIUM 20 MG PO TABS: 20.0000 mg | ORAL_TABLET | Freq: Every day | ORAL | 11 refills | Status: DC

## 2021-01-22 NOTE — Telephone Encounter (Signed)
-----  Message from Werner Lean, MD sent at 01/21/2021  8:08 AM EDT ----- Regarding: RE: HTN Thanks for reaching out.  I met him for the first time at our pre-operative evaluation and we has significantly different pressures as you mentioned.  Our team can check in with him and start HCTZ 12.5 mg PO Daily and get follow up labs in 1-2 weeks;  likely holding prior to surgery.  We will see what his amb BP is.  Thanks, MAC ----- Message ----- From: Jacinta Shoe, PA-C Sent: 01/21/2021   1:22 AM EDT To: Werner Lean, MD Subject: HTN                                            Hi Dr. Gasper Sells,  I am one of the physician assistants in the pre-anesthesia clinic at [facility]. I evaluated Mr. Servidio at his 01/20/21 afternoon visit because his BP was elevated. Our MA got the first four readings using an automatic machine. I took the last two manually, and while his SBP was no longer >200 or DBP >100, the readings remained elevated. He was asymptomatic. The previous BP readings from earlier this month were in the ~ 130/90's. He is planning to get his BP rechecked on his mom's machine on 01/21/21. He is a smoker, but recently down to ~ 1/2 PPD from ~ 1 - 1 1/2 PPD. He does drink 6-7 beers/night.    BP 199/122, HR 114 (on arrival) BP 167/100, HR 107 BP 205/108, HR 90 (after ABG) BP 207/99   BP 182/98 (manual RUE. Brachial pulse 2+). BP 162/100 (manual LUE. Brachial pulse 1+ No hematoma noted at site of ABG.)   Since he does not have a PCP, I wanted to let you know about his elevated BP readings. Currently, he is scheduled for Aorta-bifem bypass with Dr. Stanford Breed on 01/23/21, however, his WBC came back elevated at 18K, so I am waiting on further input from Dr. Stanford Breed on how he wants to proceed. I'll also let Dr. Stanford Breed know about the elevated BP readings since these could be of concern if they remain significantly elevated. If you have recommendations regarding his BP, then can you have  your staff let him know? Feel free to contact me with any questions.  Thanks, Myra Gianotti, PA-C Surgical Short Stay/Anesthesiology [facility] Phone [phone] [place] Phone [phone] 01/21/2021 1:20 AM

## 2021-01-22 NOTE — Assessment & Plan Note (Signed)
Assessment: Patient presents with a thrombocytosis in the 600s.  Thrombocytosis likely reactive and secondary to tobacco use disorder.  We will get a repeat CBC with differential today and blood smear to further evaluate his blood count abnormalities.   Plan: - CBC w/ dif - Blood smear

## 2021-01-22 NOTE — Assessment & Plan Note (Addendum)
-   We will need referral to GI for colonoscopy -We will need to make sure patient is up-to-date on vaccines. -Patient previously noted to have a history of alcohol use with 6-7 beers daily.  This will need to be further evaluated at his next appointment.

## 2021-01-22 NOTE — Addendum Note (Signed)
Addended by: Lawerance Cruel on: 01/22/2021 01:16 PM   Modules accepted: Level of Service

## 2021-01-22 NOTE — Assessment & Plan Note (Deleted)
ABIs: (2021)  Right: Resting right ankle-brachial index indicates critical limb  ischemia.   Left: Resting left ankle-brachial index indicates mild left lower  extremity arterial disease. The left toe-brachial index is normal.

## 2021-01-22 NOTE — Telephone Encounter (Signed)
Yes I agree. 

## 2021-01-22 NOTE — Assessment & Plan Note (Signed)
Will need to start patient on high intensity statin for secondary prevention.

## 2021-01-22 NOTE — Assessment & Plan Note (Addendum)
Assessment: Patient states that he originally developed pain a few months back in his right lower back and hip that radiated down his leg.  He went to get evaluated by an orthopedist who originally believed his symptoms were secondary to radicular pain and started gabapentin with minimal improvement.  Patient endorsed symptoms of claudication particularly with exertion as well as paresthesias and hyperesthesias in his right lower extremity.  As result, he was referred to vascular surgery due to concern for peripheral artery disease.  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  Patient presented for preop laboratory work with a CBC showing a leukocytosis with unknown etiology. The patient's surgery was postponed for further evaluation and management.  Patient's current known r risk factors for ASCVD/PAD are his age and significant tobacco use disorder (35-40-pack-year history).  He denies any family history of ASCVD.  Patient was recently started on aspirin 81 mg but otherwise is not taking any other medications.  Plan: -Continue ASA 81 mg for secondary prevention of PAD -We will get lipid panel and hemoglobin A1c to further evaluate risks of ASCVD -Reschedule vascular surgery - Patient will likely need to start high intensity statin. Plan to start rosuvastatin 20 mg.

## 2021-01-22 NOTE — Assessment & Plan Note (Addendum)
Assessment: Patient presents today with an elevated blood pressure of 181/100.  Repeat blood pressure was 162/100. Patient likely has HTN, but has shown some variation in his BP during in his past visits.    Recent blood pressures:  BP Readings from Last 3 Encounters:  01/21/21 (!) 152/90  01/20/21 (!) 162/100  01/09/21 130/90    Plan: - Follow up in 1 week to repeat BP  - We will likely need to start antihypertensive medications at that time.  - Plan to start HCTZ 12.5 mg and titrate up as tolerated.

## 2021-01-22 NOTE — Telephone Encounter (Signed)
Refill Request-  Please call the pharmacy back in reference to the following medication that was called in on yesterday   varenicline (CHANTIX) 0.5 MG tablet  [place], [place] (Ph: [phone])

## 2021-01-22 NOTE — Assessment & Plan Note (Signed)
Assessment: Patient presents for further evaluation and management of tobacco use disorder.  He states that he smokes 1-1.5 packs/day for 35-40 years (40-60 pack-year history). He has been in the preparation stage of quitting prior to today's appointment. He is whiling to quit smoking. He  believes his barriers to quitting are the following: under a lot of stress now and keeping is mind occupied at home.  Through shared decision-making we set a smoking/tobacco cessation date of 02/06/2021   Plan: 1. Counseling/education: I advised patient to quit, and offered support. Discussed current use pattern. Reviewed interventions to prevent relapses. 2. Screening: CXR today 3. Medication management: Chantix 0.5 mg daily for 3 days, twice daily for 4 days, then 1 mg twice daily for 11 weeks. 4. Follow up: 1 week to ensure understanding and able to get medication

## 2021-01-22 NOTE — Telephone Encounter (Signed)
Called pt reviewed MD recommendations.  Pt does not have his own BP cuff but is considering buying one.  He will attempt to have BP and HR checked daily when he can and call in or my chart readings.  He verbalizes understanding all questions answered.  Order placed.

## 2021-01-22 NOTE — Assessment & Plan Note (Signed)
Patient will need PFTs in the near future. Consider prescribing rescue inhaler.

## 2021-01-22 NOTE — Telephone Encounter (Signed)
Returned call to [place] at [facility]. Verbal auth given to dispense Chantix Starter Pack instead of the 0.5 mg #11 sent yesterday. States 0.5 mg comes in bottle of 56 and she cannot break it. After the starter pack is completed, she will dispense the 1 mg as prescribed. Will route to PCP for agreement/denial.

## 2021-01-22 NOTE — Progress Notes (Signed)
Internal Medicine Clinic Attending  Case discussed with Dr. Coe  At the time of the visit.  We reviewed the resident's history and exam and pertinent patient test results.  I agree with the assessment, diagnosis, and plan of care documented in the resident's note.  

## 2021-01-22 NOTE — Telephone Encounter (Signed)
RTC to pharmacy, pharmacist states that "the other pharmacist is currently speaking [facility] nurse at [facility] Call ended. SChaplin, RN,BSN

## 2021-01-22 NOTE — Assessment & Plan Note (Addendum)
Assessment: Patient presents for further evaluation and management of a leukocytosis with unknown etiology.  He denies any signs or symptoms of systemic inflammation including fevers, chills, myalgias, or fatigue.  Additionally he denies any sick contacts or recent travel.  He states that he has not had any chest pain, shortness of breath, cough, or wheezing.  No recent weight loss, night sweats, or masses/lumps/rash.  He states that he has a regular bowel movement without hematochezia/melena.  He has not had a colonoscopy yet.  He denies any lower urinary tract symptoms.  He does endorse having a tick bite roughly 3 months ago that was pruritic when healing, but he denies any rash or additional symptoms associated with the bite.   On physical exam, patient does not have any lymphadenopathy, masses, or rashes.  No obvious lesions in his mouth. His lungs were clear to auscultation bilaterally.  He does have clubbing of his fingernails.  Although his right lower extremity was cool to the touch, there were no obvious lesions on his lower extremities.   Patient's leukocytosis is likely reactive in the setting of his chronic tobacco use disorder, which would present with a neutrophilic predominant leukocytosis. I will perform additional diagnostic test today to further evaluate the etiology of his leukocytosis.  Plan: - CBC with dif - blood smear - CXR

## 2021-01-23 ENCOUNTER — Encounter (HOSPITAL_COMMUNITY): Admission: RE | Payer: Self-pay | Source: Home / Self Care

## 2021-01-23 ENCOUNTER — Inpatient Hospital Stay (HOSPITAL_COMMUNITY)
Admission: RE | Admit: 2021-01-23 | Payer: BC Managed Care – PPO | Source: Home / Self Care | Admitting: Vascular Surgery

## 2021-01-23 SURGERY — CREATION, BYPASS, ARTERIAL, AORTA TO FEMORAL, BILATERAL, USING GRAFT
Anesthesia: General | Laterality: Bilateral

## 2021-01-28 ENCOUNTER — Encounter: Payer: Self-pay | Admitting: Internal Medicine

## 2021-01-28 ENCOUNTER — Ambulatory Visit (INDEPENDENT_AMBULATORY_CARE_PROVIDER_SITE_OTHER): Payer: BC Managed Care – PPO | Admitting: Internal Medicine

## 2021-01-28 VITALS — BP 124/94 | HR 79 | Temp 98.2°F | Wt 123.9 lb

## 2021-01-28 DIAGNOSIS — I739 Peripheral vascular disease, unspecified: Secondary | ICD-10-CM

## 2021-01-28 DIAGNOSIS — F17201 Nicotine dependence, unspecified, in remission: Secondary | ICD-10-CM | POA: Diagnosis not present

## 2021-01-28 DIAGNOSIS — R030 Elevated blood-pressure reading, without diagnosis of hypertension: Secondary | ICD-10-CM | POA: Diagnosis not present

## 2021-01-28 DIAGNOSIS — E782 Mixed hyperlipidemia: Secondary | ICD-10-CM | POA: Diagnosis not present

## 2021-01-28 DIAGNOSIS — I10 Essential (primary) hypertension: Secondary | ICD-10-CM | POA: Diagnosis not present

## 2021-01-28 NOTE — Progress Notes (Signed)
CC: PAD  HPI:  [name] is a 60 y.o. male with a past medical history stated below and presents today for PAD. Please see problem based assessment and plan for additional details.  Past Medical History:  Diagnosis Date   Alcohol use    6-7 beers/day (as of 01/21/21)   PAD (peripheral artery disease) (HCC)    Tobacco abuse     Current Outpatient Medications on File Prior to Visit  Medication Sig Dispense Refill   aspirin EC 81 MG tablet Take 81 mg by mouth daily. Swallow whole.     gabapentin (NEURONTIN) 300 MG capsule Take 300-600 mg by mouth See admin instructions. Take 1 capsule (300 mg) by mouth in the morning & take 2 capsules (600 mg) by mouth in the evening.     hydrochlorothiazide (MICROZIDE) 12.5 MG capsule Take 1 capsule (12.5 mg total) by mouth daily. 90 capsule 3   rosuvastatin (CRESTOR) 20 MG tablet Take 1 tablet (20 mg total) by mouth daily. 30 tablet 11   varenicline (CHANTIX) 1 MG tablet Take 1 tablet (1 mg total) by mouth 2 (two) times daily. 60 tablet 2   No current facility-administered medications on file prior to visit.    Family History  Problem Relation Age of Onset   Kidney cancer Mother     Social History   Socioeconomic History   Marital status: Single    Spouse name: Not on file   Number of children: Not on file   Years of education: Not on file   Highest education level: Not on file  Occupational History   Not on file  Tobacco Use   Smoking status: Every Day    Packs/day: 1.00    Types: Cigarettes   Smokeless tobacco: Never   Tobacco comments:    Currently taking chantix-as of 01/28/2021-pt had not smoked in one week.[name], [name]8/23/202211:07 AM       Vaping Use   Vaping Use: Never used  Substance and Sexual Activity   Alcohol use: Yes    Alcohol/week: 49.0 standard drinks    Types: 49 Cans of beer per week    Comment: 6-7 beers a day (01/21/21)   Drug use: Never   Sexual activity: Not on file  Other  Topics Concern   Not on file  Social History Narrative   Not on file   Social Determinants of Health   Financial Resource Strain: Not on file  Food Insecurity: Not on file  Transportation Needs: Not on file  Physical Activity: Not on file  Stress: Not on file  Social Connections: Not on file  Intimate Partner Violence: Not on file    Review of Systems: ROS negative except for what is noted on the assessment and plan.  Vitals:   01/28/21 1005 01/28/21 1022  BP: (!) 138/99 (!) 124/94  Pulse: 88 79  Temp: 98.2 F (36.8 C)   TempSrc: Oral   SpO2: 100%   Weight: 123 lb 14.4 oz (56.2 kg)     Physical Exam: Gen: A&O x3 and in no apparent distress, well appearing and nourished. HEENT: Head - normocephalic, atraumatic with facial plethora; Eye -  visual acuity grossly intact, conjunctiva clear, sclera non-icteric, EOM intact. Mouth - No obvious caries or periodontal disease. Neck: no obvious masses or nodules, AROM intact. CV: RRR, no murmurs, rubs, or gallops. S1/S2 presents  Resp: Clear to ascultation bilaterally  Abd: BS (+) x4, soft, non-tender, without obvious hepatosplenomegaly or masses MSK: Grossly  normal AROM and  Skin: good skin turgor, no rashes, unusual bruising, or prominent lesions.  Neuro: Right leg 4/5 weakness and paresthesias and normal coordination.  Psych: Oriented x3 and responding appropriately. Intact recent and remote memory, normal mood, judgement, affect , and insight.    Assessment & Plan:   See Encounters Tab for problem based charting.  Patient discussed with Dr. Hilbert Odor, D.O. [facility], PGY-3 Pager: [phone], Phone: [phone] Date 01/29/2021 Time 6:41 AM

## 2021-01-28 NOTE — Patient Instructions (Signed)
Thank you, Mr.Shizuo D Brideau for allowing [place] to provide your care today. Today we discussed blood pressure, cholesterol, peripheral artery disease, tobacco cessation.    Labs Ordered:  Lab Orders         BMP8+Anion Gap      Tests Ordered: Orders Placed This Encounter  Procedures   BMP8+Anion Gap      Referrals Ordered:  Referral Orders  No referral(s) requested today     Medication Changes:  - Please make sure to pick up your Crestor at the pharmacy.    Health Maintenance Screening: There are no preventive care reminders to display for this patient.   Instructions:   Follow up: 3 months   Remember: If you have any questions or concerns, call our clinic at [phone] or after hours call [phone] and ask for the internal medicine resident on call.  [name], D.O. [facility]

## 2021-01-29 ENCOUNTER — Encounter: Payer: Self-pay | Admitting: Internal Medicine

## 2021-01-29 DIAGNOSIS — I700 Atherosclerosis of aorta: Secondary | ICD-10-CM

## 2021-01-29 DIAGNOSIS — E785 Hyperlipidemia, unspecified: Secondary | ICD-10-CM | POA: Insufficient documentation

## 2021-01-29 DIAGNOSIS — I739 Peripheral vascular disease, unspecified: Secondary | ICD-10-CM

## 2021-01-29 LAB — PATHOLOGIST SMEAR REVIEW
Basophils Absolute: 0.3 10*3/uL — ABNORMAL HIGH (ref 0.0–0.2)
Basos: 1 %
EOS (ABSOLUTE): 0.3 10*3/uL (ref 0.0–0.4)
Eos: 2 %
Hematocrit: 48.8 % (ref 37.5–51.0)
Hemoglobin: 16.9 g/dL (ref 13.0–17.7)
Immature Grans (Abs): 0.7 10*3/uL — ABNORMAL HIGH (ref 0.0–0.1)
Immature Granulocytes: 4 %
Lymphocytes Absolute: 4 10*3/uL — ABNORMAL HIGH (ref 0.7–3.1)
Lymphs: 21 %
MCH: 32.4 pg (ref 26.6–33.0)
MCHC: 34.6 g/dL (ref 31.5–35.7)
MCV: 94 fL (ref 79–97)
Monocytes Absolute: 1.9 10*3/uL — ABNORMAL HIGH (ref 0.1–0.9)
Monocytes: 10 %
Neutrophils Absolute: 11.5 10*3/uL — ABNORMAL HIGH (ref 1.4–7.0)
Neutrophils: 62 %
Path Rev RBC: NORMAL
Platelets: 647 10*3/uL — ABNORMAL HIGH (ref 150–450)
RBC: 5.21 x10E6/uL (ref 4.14–5.80)
RDW: 13.9 % (ref 11.6–15.4)
WBC: 18.6 10*3/uL — ABNORMAL HIGH (ref 3.4–10.8)

## 2021-01-29 LAB — BMP8+ANION GAP
Anion Gap: 15 mmol/L (ref 10.0–18.0)
BUN/Creatinine Ratio: 15 (ref 9–20)
BUN: 10 mg/dL (ref 6–24)
CO2: 30 mmol/L — ABNORMAL HIGH (ref 20–29)
Calcium: 10.2 mg/dL (ref 8.7–10.2)
Chloride: 91 mmol/L — ABNORMAL LOW (ref 96–106)
Creatinine, Ser: 0.68 mg/dL — ABNORMAL LOW (ref 0.76–1.27)
Glucose: 84 mg/dL (ref 65–99)
Potassium: 4.4 mmol/L (ref 3.5–5.2)
Sodium: 136 mmol/L (ref 134–144)
eGFR: 107 mL/min/{1.73_m2} (ref >59)

## 2021-01-29 NOTE — Assessment & Plan Note (Signed)
Assessment: Patient presents for reevaluation and management of his tobacco use disorder.  Patient states that he is started Chantix which has significantly helped reduce his desire to smoke. He states that he has not smoked a cigarette since last Wednesday.    Plan: - I commended him for his progress and encouraged him regarding future success with tobacco cessation. - Continue Chantix

## 2021-01-29 NOTE — Assessment & Plan Note (Signed)
Assessment: Patient has elevated hyperlipidemia in the setting of ASCVD.  Due to his significant risk of future events, patient was started on aspirin 81 mg and rosuvastatin 20 mg.  Lipid Panel     Component Value Date/Time   CHOL 171 01/21/2021 1617   TRIG 75 01/21/2021 1617   HDL 55 01/21/2021 1617   LDLCALC 102 (H) 01/21/2021 1617   Plan: -We will repeat lipid panel in 6 to 8 weeks or at his follow-up appointment. -Goal LDL < 70 -Consider adding Zetia if not at goal

## 2021-01-29 NOTE — Assessment & Plan Note (Signed)
Patient's surgery rescheduled to September 2.

## 2021-01-29 NOTE — Assessment & Plan Note (Signed)
OnAssessment: Patient presents for reevaluation of his blood pressure.  Today his blood pressure is 124/94 hydrochlorothiazide 12.5 mg.  Significantly improved from his previous blood pressure with SBP's in the 150-160s.  Patient states that he is doing well on this medication and denies any side effects.  Patient states that he is making a conscious effort to improve his diet, although his PAD limits his ability to exercise currently.  We will need to provide more education regarding DASH diet/Mediterranean diet and recommendations regarding moderate intensity exercise at follow-up visit.  Plan: -Continue hydrochlorothiazide 12.5 mg daily -We will get BMP for electrolytes and kidney function

## 2021-01-31 ENCOUNTER — Telehealth: Payer: Self-pay

## 2021-01-31 NOTE — Telephone Encounter (Signed)
I spoke with patient and advised that FMLA paperwork has been completed and faxed. Patient states his job has notified him that they did receive FMLA forms. Nothing additional will be needed at this time.

## 2021-02-03 ENCOUNTER — Other Ambulatory Visit: Payer: Self-pay | Admitting: Vascular Surgery

## 2021-02-04 ENCOUNTER — Encounter (HOSPITAL_COMMUNITY): Payer: Self-pay | Admitting: Vascular Surgery

## 2021-02-04 ENCOUNTER — Other Ambulatory Visit: Payer: Self-pay

## 2021-02-04 ENCOUNTER — Ambulatory Visit: Payer: BC Managed Care – PPO | Admitting: Vascular Surgery

## 2021-02-04 LAB — SARS CORONAVIRUS 2 (TAT 6-24 HRS): SARS Coronavirus 2: NEGATIVE

## 2021-02-04 NOTE — Progress Notes (Signed)
Covid test 02/03/21 negative.

## 2021-02-04 NOTE — Progress Notes (Signed)
Internal Medicine Clinic Attending  Case discussed with Dr. Coe  At the time of the visit.  We reviewed the resident's history and exam and pertinent patient test results.  I agree with the assessment, diagnosis, and plan of care documented in the resident's note.  

## 2021-02-05 NOTE — Anesthesia Preprocedure Evaluation (Addendum)
Anesthesia Evaluation  Patient identified by MRN, date of birth, ID band Patient awake    Reviewed: Allergy & Precautions, NPO status , Patient's Chart, lab work & pertinent test results  Airway Mallampati: II  TM Distance: >3 FB Neck ROM: Full    Dental  (+) Teeth Intact, Dental Advisory Given   Pulmonary COPD, former smoker,    Pulmonary exam normal breath sounds clear to auscultation       Cardiovascular hypertension, Pt. on medications + Peripheral Vascular Disease (aortoiliac occlusive disease)  Normal cardiovascular exam Rhythm:Regular Rate:Normal     Neuro/Psych negative neurological ROS     GI/Hepatic negative GI ROS, (+)     substance abuse  alcohol use,   Endo/Other  negative endocrine ROS  Renal/GU negative Renal ROS     Musculoskeletal negative musculoskeletal ROS (+)   Abdominal   Peds  Hematology negative hematology ROS (+)   Anesthesia Other Findings   Reproductive/Obstetrics                            Anesthesia Physical Anesthesia Plan  ASA: 4  Anesthesia Plan: General   Post-op Pain Management:    Induction: Intravenous  PONV Risk Score and Plan: 3 and Midazolam, Dexamethasone and Ondansetron  Airway Management Planned: Oral ETT  Additional Equipment: Arterial line, CVP and Ultrasound Guidance Line Placement  Intra-op Plan:   Post-operative Plan: Possible Post-op intubation/ventilation  Informed Consent: I have reviewed the patients History and Physical, chart, labs and discussed the procedure including the risks, benefits and alternatives for the proposed anesthesia with the patient or authorized representative who has indicated his/her understanding and acceptance.     Dental advisory given  Plan Discussed with: CRNA  Anesthesia Plan Comments:        Anesthesia Quick Evaluation

## 2021-02-06 ENCOUNTER — Inpatient Hospital Stay (HOSPITAL_COMMUNITY): Payer: BC Managed Care – PPO

## 2021-02-06 ENCOUNTER — Other Ambulatory Visit: Payer: Self-pay

## 2021-02-06 ENCOUNTER — Encounter (HOSPITAL_COMMUNITY): Payer: Self-pay | Admitting: Vascular Surgery

## 2021-02-06 ENCOUNTER — Encounter (HOSPITAL_COMMUNITY)
Admission: RE | Disposition: A | Discharge status: Home Health | Payer: Self-pay | Source: Home / Self Care | Attending: Vascular Surgery

## 2021-02-06 ENCOUNTER — Inpatient Hospital Stay (HOSPITAL_COMMUNITY): Payer: BC Managed Care – PPO | Admitting: Certified Registered Nurse Anesthetist

## 2021-02-06 ENCOUNTER — Inpatient Hospital Stay (HOSPITAL_COMMUNITY)
Admission: RE | Admit: 2021-02-06 | Discharge: 2021-02-16 | DRG: 271 | Disposition: A | Discharge status: Home Health | Payer: BC Managed Care – PPO | Attending: Vascular Surgery | Admitting: Vascular Surgery

## 2021-02-06 DIAGNOSIS — Z20822 Contact with and (suspected) exposure to covid-19: Secondary | ICD-10-CM | POA: Diagnosis present

## 2021-02-06 DIAGNOSIS — E785 Hyperlipidemia, unspecified: Secondary | ICD-10-CM | POA: Diagnosis present

## 2021-02-06 DIAGNOSIS — E876 Hypokalemia: Secondary | ICD-10-CM | POA: Diagnosis not present

## 2021-02-06 DIAGNOSIS — F17200 Nicotine dependence, unspecified, uncomplicated: Secondary | ICD-10-CM | POA: Diagnosis present

## 2021-02-06 DIAGNOSIS — I10 Essential (primary) hypertension: Secondary | ICD-10-CM | POA: Diagnosis present

## 2021-02-06 DIAGNOSIS — I7409 Other arterial embolism and thrombosis of abdominal aorta: Secondary | ICD-10-CM | POA: Diagnosis present

## 2021-02-06 DIAGNOSIS — D72828 Other elevated white blood cell count: Secondary | ICD-10-CM | POA: Diagnosis present

## 2021-02-06 DIAGNOSIS — F10239 Alcohol dependence with withdrawal, unspecified: Secondary | ICD-10-CM | POA: Diagnosis present

## 2021-02-06 DIAGNOSIS — D75838 Other thrombocytosis: Secondary | ICD-10-CM | POA: Diagnosis not present

## 2021-02-06 DIAGNOSIS — I700 Atherosclerosis of aorta: Secondary | ICD-10-CM | POA: Diagnosis not present

## 2021-02-06 DIAGNOSIS — J449 Chronic obstructive pulmonary disease, unspecified: Secondary | ICD-10-CM | POA: Diagnosis present

## 2021-02-06 DIAGNOSIS — D62 Acute posthemorrhagic anemia: Secondary | ICD-10-CM | POA: Diagnosis not present

## 2021-02-06 DIAGNOSIS — K567 Ileus, unspecified: Secondary | ICD-10-CM

## 2021-02-06 DIAGNOSIS — I70223 Atherosclerosis of native arteries of extremities with rest pain, bilateral legs: Secondary | ICD-10-CM | POA: Diagnosis present

## 2021-02-06 DIAGNOSIS — M79661 Pain in right lower leg: Secondary | ICD-10-CM | POA: Diagnosis present

## 2021-02-06 DIAGNOSIS — Z4659 Encounter for fitting and adjustment of other gastrointestinal appliance and device: Secondary | ICD-10-CM

## 2021-02-06 HISTORY — DX: Other arterial embolism and thrombosis of abdominal aorta: I74.09

## 2021-02-06 HISTORY — DX: Essential (primary) hypertension: I10

## 2021-02-06 HISTORY — PX: AORTA - BILATERAL FEMORAL ARTERY BYPASS GRAFT: SHX1175

## 2021-02-06 LAB — BASIC METABOLIC PANEL
Anion gap: 9 (ref 5–15)
BUN: 14 mg/dL (ref 6–20)
CO2: 25 mmol/L (ref 22–32)
Calcium: 8 mg/dL — ABNORMAL LOW (ref 8.9–10.3)
Chloride: 101 mmol/L (ref 98–111)
Creatinine, Ser: 0.79 mg/dL (ref 0.61–1.24)
GFR, Estimated: >60 mL/min (ref >60)
Glucose, Bld: 178 mg/dL — ABNORMAL HIGH (ref 70–99)
Potassium: 3.8 mmol/L (ref 3.5–5.1)
Sodium: 135 mmol/L (ref 135–145)

## 2021-02-06 LAB — PREPARE RBC (CROSSMATCH)

## 2021-02-06 LAB — POCT I-STAT 7, (LYTES, BLD GAS, ICA,H+H)
Acid-Base Excess: 1 mmol/L (ref 0.0–2.0)
Bicarbonate: 26 mmol/L (ref 20.0–28.0)
Calcium, Ion: 1.11 mmol/L — ABNORMAL LOW (ref 1.15–1.40)
HCT: 43 % (ref 39.0–52.0)
Hemoglobin: 14.6 g/dL (ref 13.0–17.0)
O2 Saturation: 96 %
Patient temperature: 98.2
Potassium: 3.8 mmol/L (ref 3.5–5.1)
Sodium: 136 mmol/L (ref 135–145)
TCO2: 27 mmol/L (ref 22–32)
pCO2 arterial: 40.6 mmHg (ref 32.0–48.0)
pH, Arterial: 7.413 (ref 7.350–7.450)
pO2, Arterial: 78 mmHg — ABNORMAL LOW (ref 83.0–108.0)

## 2021-02-06 LAB — CBC
HCT: 41.9 % (ref 39.0–52.0)
Hemoglobin: 14.5 g/dL (ref 13.0–17.0)
MCH: 33.3 pg (ref 26.0–34.0)
MCHC: 34.6 g/dL (ref 30.0–36.0)
MCV: 96.1 fL (ref 80.0–100.0)
Platelets: 623 10*3/uL — ABNORMAL HIGH (ref 150–400)
RBC: 4.36 MIL/uL (ref 4.22–5.81)
RDW: 13.2 % (ref 11.5–15.5)
WBC: 26.9 10*3/uL — ABNORMAL HIGH (ref 4.0–10.5)
nRBC: 0 % (ref 0.0–0.2)

## 2021-02-06 LAB — LACTIC ACID, PLASMA: Lactic Acid, Venous: 1.7 mmol/L (ref 0.5–1.9)

## 2021-02-06 SURGERY — CREATION, BYPASS, ARTERIAL, AORTA TO FEMORAL, BILATERAL, USING GRAFT
Anesthesia: General | Site: Abdomen

## 2021-02-06 MED ORDER — 0.9 % SODIUM CHLORIDE (POUR BTL) OPTIME
TOPICAL | Status: DC | PRN
  Administered 2021-02-06 (×3): 1000 mL

## 2021-02-06 MED ORDER — PROMETHAZINE HCL 25 MG/ML IJ SOLN: 6.2500 mg | INTRAMUSCULAR | Status: DC | PRN

## 2021-02-06 MED ORDER — CHLORHEXIDINE GLUCONATE 0.12 % MT SOLN
15.0000 mL | Freq: Once | OROMUCOSAL | Status: AC
  Administered 2021-02-06: 15 mL via OROMUCOSAL
  Filled 2021-02-06: qty 15

## 2021-02-06 MED ORDER — ORAL CARE MOUTH RINSE: 15.0000 mL | Freq: Once | OROMUCOSAL | Status: AC

## 2021-02-06 MED ORDER — VANCOMYCIN HCL IN DEXTROSE 1-5 GM/200ML-% IV SOLN
1000.0000 mg | INTRAVENOUS | Status: AC
  Administered 2021-02-06: 1000 mg via INTRAVENOUS
  Filled 2021-02-06: qty 200

## 2021-02-06 MED ORDER — LABETALOL HCL 5 MG/ML IV SOLN
INTRAVENOUS | Status: AC
  Filled 2021-02-06: qty 4

## 2021-02-06 MED ORDER — ESMOLOL HCL 100 MG/10ML IV SOLN
INTRAVENOUS | Status: AC
  Filled 2021-02-06: qty 10

## 2021-02-06 MED ORDER — SODIUM CHLORIDE 0.9% IV SOLUTION: Freq: Once | INTRAVENOUS | Status: DC

## 2021-02-06 MED ORDER — PHENYLEPHRINE 40 MCG/ML (10ML) SYRINGE FOR IV PUSH (FOR BLOOD PRESSURE SUPPORT)
PREFILLED_SYRINGE | INTRAVENOUS | Status: AC
  Filled 2021-02-06: qty 10

## 2021-02-06 MED ORDER — ALBUMIN HUMAN 5 % IV SOLN: INTRAVENOUS | Status: DC | PRN

## 2021-02-06 MED ORDER — LABETALOL HCL 5 MG/ML IV SOLN
10.0000 mg | INTRAVENOUS | Status: DC | PRN
  Administered 2021-02-10: 10 mg via INTRAVENOUS
  Filled 2021-02-06: qty 4

## 2021-02-06 MED ORDER — ONDANSETRON HCL 4 MG/2ML IJ SOLN
4.0000 mg | Freq: Four times a day (QID) | INTRAMUSCULAR | Status: DC | PRN
  Administered 2021-02-09 – 2021-02-11 (×3): 4 mg via INTRAVENOUS
  Filled 2021-02-06 (×3): qty 2

## 2021-02-06 MED ORDER — POTASSIUM CHLORIDE CRYS ER 20 MEQ PO TBCR: 20.0000 meq | EXTENDED_RELEASE_TABLET | Freq: Every day | ORAL | Status: DC | PRN

## 2021-02-06 MED ORDER — ACETAMINOPHEN 500 MG PO TABS
1000.0000 mg | ORAL_TABLET | Freq: Once | ORAL | Status: AC
  Administered 2021-02-06: 1000 mg via ORAL
  Filled 2021-02-06: qty 2

## 2021-02-06 MED ORDER — LIDOCAINE 2% (20 MG/ML) 5 ML SYRINGE
INTRAMUSCULAR | Status: AC
  Filled 2021-02-06: qty 5

## 2021-02-06 MED ORDER — ORAL CARE MOUTH RINSE
15.0000 mL | Freq: Two times a day (BID) | OROMUCOSAL | Status: DC
  Administered 2021-02-09 – 2021-02-10 (×2): 15 mL via OROMUCOSAL

## 2021-02-06 MED ORDER — LACTATED RINGERS IV SOLN
INTRAVENOUS | Status: DC | PRN
Start: 2021-02-06 — End: 2021-02-06

## 2021-02-06 MED ORDER — CHLORHEXIDINE GLUCONATE CLOTH 2 % EX PADS: 6.0000 | MEDICATED_PAD | Freq: Once | CUTANEOUS | Status: DC

## 2021-02-06 MED ORDER — LACTATED RINGERS IV SOLN: INTRAVENOUS | Status: DC

## 2021-02-06 MED ORDER — PROTAMINE SULFATE 10 MG/ML IV SOLN
INTRAVENOUS | Status: DC | PRN
  Administered 2021-02-06: 20 mg via INTRAVENOUS
  Administered 2021-02-06: 10 mg via INTRAVENOUS
  Administered 2021-02-06: 20 mg via INTRAVENOUS

## 2021-02-06 MED ORDER — CHLORHEXIDINE GLUCONATE CLOTH 2 % EX PADS
6.0000 | MEDICATED_PAD | Freq: Every day | CUTANEOUS | Status: DC
  Administered 2021-02-06 – 2021-02-08 (×3): 6 via TOPICAL

## 2021-02-06 MED ORDER — HEPARIN 6000 UNIT IRRIGATION SOLUTION
Status: AC
  Filled 2021-02-06: qty 500

## 2021-02-06 MED ORDER — PHENOL 1.4 % MT LIQD
1.0000 | OROMUCOSAL | Status: DC | PRN
  Filled 2021-02-06: qty 177

## 2021-02-06 MED ORDER — GUAIFENESIN-DM 100-10 MG/5ML PO SYRP: 15.0000 mL | ORAL_SOLUTION | ORAL | Status: DC | PRN

## 2021-02-06 MED ORDER — PHENYLEPHRINE HCL-NACL 20-0.9 MG/250ML-% IV SOLN
INTRAVENOUS | Status: DC | PRN
  Administered 2021-02-06: 30 ug/min via INTRAVENOUS

## 2021-02-06 MED ORDER — MORPHINE SULFATE (PF) 2 MG/ML IV SOLN
2.0000 mg | INTRAVENOUS | Status: DC | PRN
  Administered 2021-02-06 – 2021-02-11 (×6): 2 mg via INTRAVENOUS
  Filled 2021-02-06 (×6): qty 1

## 2021-02-06 MED ORDER — PROPOFOL 10 MG/ML IV BOLUS
INTRAVENOUS | Status: AC
  Filled 2021-02-06: qty 20

## 2021-02-06 MED ORDER — ROCURONIUM BROMIDE 10 MG/ML (PF) SYRINGE
PREFILLED_SYRINGE | INTRAVENOUS | Status: AC
  Filled 2021-02-06: qty 10

## 2021-02-06 MED ORDER — VANCOMYCIN HCL IN DEXTROSE 1-5 GM/200ML-% IV SOLN
1000.0000 mg | Freq: Two times a day (BID) | INTRAVENOUS | Status: AC
  Administered 2021-02-06 – 2021-02-07 (×2): 1000 mg via INTRAVENOUS
  Filled 2021-02-06 (×2): qty 200

## 2021-02-06 MED ORDER — DEXAMETHASONE SODIUM PHOSPHATE 10 MG/ML IJ SOLN
INTRAMUSCULAR | Status: DC | PRN
  Administered 2021-02-06: 10 mg via INTRAVENOUS

## 2021-02-06 MED ORDER — PROPOFOL 10 MG/ML IV BOLUS
INTRAVENOUS | Status: DC | PRN
  Administered 2021-02-06: 150 mg via INTRAVENOUS

## 2021-02-06 MED ORDER — ROCURONIUM BROMIDE 10 MG/ML (PF) SYRINGE
PREFILLED_SYRINGE | INTRAVENOUS | Status: DC | PRN
Start: 2021-02-06 — End: 2021-02-06
  Administered 2021-02-06: 50 mg via INTRAVENOUS
  Administered 2021-02-06: 40 mg via INTRAVENOUS
  Administered 2021-02-06: 20 mg via INTRAVENOUS
  Administered 2021-02-06: 60 mg via INTRAVENOUS

## 2021-02-06 MED ORDER — MAGNESIUM SULFATE 2 GM/50ML IV SOLN: 2.0000 g | Freq: Every day | INTRAVENOUS | Status: DC | PRN

## 2021-02-06 MED ORDER — LACTATED RINGERS IV SOLN: INTRAVENOUS | Status: DC | PRN

## 2021-02-06 MED ORDER — HEMOSTATIC AGENTS (NO CHARGE) OPTIME
TOPICAL | Status: DC | PRN
  Administered 2021-02-06 (×4): 1 via TOPICAL

## 2021-02-06 MED ORDER — OXYCODONE-ACETAMINOPHEN 5-325 MG PO TABS
1.0000 | ORAL_TABLET | ORAL | Status: DC | PRN
  Administered 2021-02-07: 1 via ORAL
  Filled 2021-02-06: qty 2
  Filled 2021-02-06 (×2): qty 1

## 2021-02-06 MED ORDER — SODIUM CHLORIDE 0.9 % IV SOLN: INTRAVENOUS | Status: DC

## 2021-02-06 MED ORDER — LIDOCAINE 2% (20 MG/ML) 5 ML SYRINGE
INTRAMUSCULAR | Status: DC | PRN
  Administered 2021-02-06: 60 mg via INTRAVENOUS

## 2021-02-06 MED ORDER — HEPARIN 6000 UNIT IRRIGATION SOLUTION
Status: DC | PRN
  Administered 2021-02-06 (×2): 1

## 2021-02-06 MED ORDER — SODIUM CHLORIDE 0.9% FLUSH: 10.0000 mL | INTRAVENOUS | Status: DC | PRN

## 2021-02-06 MED ORDER — FENTANYL CITRATE (PF) 250 MCG/5ML IJ SOLN
INTRAMUSCULAR | Status: AC
  Filled 2021-02-06: qty 5

## 2021-02-06 MED ORDER — SUGAMMADEX SODIUM 200 MG/2ML IV SOLN
INTRAVENOUS | Status: DC | PRN
  Administered 2021-02-06: 150 mg via INTRAVENOUS

## 2021-02-06 MED ORDER — ACETAMINOPHEN 325 MG RE SUPP: 325.0000 mg | RECTAL | Status: DC | PRN

## 2021-02-06 MED ORDER — SODIUM CHLORIDE 0.9 % IV SOLN: 500.0000 mL | Freq: Once | INTRAVENOUS | Status: DC | PRN

## 2021-02-06 MED ORDER — MIDAZOLAM HCL 5 MG/5ML IJ SOLN
INTRAMUSCULAR | Status: DC | PRN
  Administered 2021-02-06: 2 mg via INTRAVENOUS

## 2021-02-06 MED ORDER — MIDAZOLAM HCL 2 MG/2ML IJ SOLN
INTRAMUSCULAR | Status: AC
  Filled 2021-02-06: qty 2

## 2021-02-06 MED ORDER — HEPARIN SODIUM (PORCINE) 1000 UNIT/ML IJ SOLN
INTRAMUSCULAR | Status: AC
  Filled 2021-02-06: qty 1

## 2021-02-06 MED ORDER — DOCUSATE SODIUM 100 MG PO CAPS
100.0000 mg | ORAL_CAPSULE | Freq: Every day | ORAL | Status: DC
  Administered 2021-02-07 – 2021-02-10 (×4): 100 mg via ORAL
  Filled 2021-02-06 (×4): qty 1

## 2021-02-06 MED ORDER — SODIUM CHLORIDE 0.9% FLUSH
10.0000 mL | Freq: Two times a day (BID) | INTRAVENOUS | Status: DC
  Administered 2021-02-06 – 2021-02-15 (×12): 10 mL

## 2021-02-06 MED ORDER — LABETALOL HCL 5 MG/ML IV SOLN
20.0000 mg | Freq: Once | INTRAVENOUS | Status: AC
  Administered 2021-02-06: 10 mg via INTRAVENOUS

## 2021-02-06 MED ORDER — ONDANSETRON HCL 4 MG/2ML IJ SOLN
INTRAMUSCULAR | Status: AC
  Filled 2021-02-06: qty 2

## 2021-02-06 MED ORDER — ACETAMINOPHEN 325 MG PO TABS: 325.0000 mg | ORAL_TABLET | ORAL | Status: DC | PRN

## 2021-02-06 MED ORDER — ESMOLOL HCL 100 MG/10ML IV SOLN
INTRAVENOUS | Status: DC | PRN
  Administered 2021-02-06: 30 mg via INTRAVENOUS

## 2021-02-06 MED ORDER — HEPARIN SODIUM (PORCINE) 1000 UNIT/ML IJ SOLN
INTRAMUSCULAR | Status: DC | PRN
  Administered 2021-02-06 (×3): 2000 [IU] via INTRAVENOUS
  Administered 2021-02-06: 3000 [IU] via INTRAVENOUS
  Administered 2021-02-06: 6000 [IU] via INTRAVENOUS

## 2021-02-06 MED ORDER — METOPROLOL TARTRATE 5 MG/5ML IV SOLN: 2.0000 mg | INTRAVENOUS | Status: DC | PRN

## 2021-02-06 MED ORDER — PHENYLEPHRINE 40 MCG/ML (10ML) SYRINGE FOR IV PUSH (FOR BLOOD PRESSURE SUPPORT)
PREFILLED_SYRINGE | INTRAVENOUS | Status: DC | PRN
Start: 2021-02-06 — End: 2021-02-06
  Administered 2021-02-06 (×3): 120 ug via INTRAVENOUS

## 2021-02-06 MED ORDER — HYDRALAZINE HCL 20 MG/ML IJ SOLN
5.0000 mg | INTRAMUSCULAR | Status: DC | PRN
Start: 2021-02-06 — End: 2021-02-16

## 2021-02-06 MED ORDER — CHLORHEXIDINE GLUCONATE 0.12 % MT SOLN
15.0000 mL | Freq: Two times a day (BID) | OROMUCOSAL | Status: DC
  Administered 2021-02-06 – 2021-02-16 (×9): 15 mL via OROMUCOSAL
  Filled 2021-02-06 (×7): qty 15

## 2021-02-06 MED ORDER — SODIUM CHLORIDE 0.9 % IV SOLN
INTRAVENOUS | Status: DC | PRN
Start: 2021-02-06 — End: 2021-02-06

## 2021-02-06 MED ORDER — ALUM & MAG HYDROXIDE-SIMETH 200-200-20 MG/5ML PO SUSP
15.0000 mL | ORAL | Status: DC | PRN
  Filled 2021-02-06: qty 30

## 2021-02-06 MED ORDER — ONDANSETRON HCL 4 MG/2ML IJ SOLN
INTRAMUSCULAR | Status: DC | PRN
  Administered 2021-02-06: 4 mg via INTRAVENOUS

## 2021-02-06 MED ORDER — PANTOPRAZOLE SODIUM 40 MG IV SOLR
40.0000 mg | INTRAVENOUS | Status: DC
  Administered 2021-02-06 – 2021-02-13 (×8): 40 mg via INTRAVENOUS
  Filled 2021-02-06 (×8): qty 40

## 2021-02-06 MED ORDER — FENTANYL CITRATE (PF) 100 MCG/2ML IJ SOLN: 25.0000 ug | INTRAMUSCULAR | Status: DC | PRN

## 2021-02-06 MED ORDER — FENTANYL CITRATE (PF) 250 MCG/5ML IJ SOLN
INTRAMUSCULAR | Status: DC | PRN
  Administered 2021-02-06 (×8): 50 ug via INTRAVENOUS
  Administered 2021-02-06: 100 ug via INTRAVENOUS

## 2021-02-06 MED ORDER — PROTAMINE SULFATE 10 MG/ML IV SOLN
INTRAVENOUS | Status: AC
  Filled 2021-02-06: qty 5

## 2021-02-06 MED ORDER — CHLORHEXIDINE GLUCONATE CLOTH 2 % EX PADS
6.0000 | MEDICATED_PAD | Freq: Every day | CUTANEOUS | Status: DC
  Administered 2021-02-08 – 2021-02-09 (×2): 6 via TOPICAL

## 2021-02-06 MED ORDER — PANTOPRAZOLE SODIUM 40 MG PO TBEC
40.0000 mg | DELAYED_RELEASE_TABLET | Freq: Every day | ORAL | Status: DC
  Filled 2021-02-06: qty 1

## 2021-02-06 SURGICAL SUPPLY — 66 items
BAG COUNTER SPONGE SURGICOUNT (BAG) ×3 IMPLANT
BAG ISOLATION DRAPE 18X18 (DRAPES) ×4 IMPLANT
BAG SURGICOUNT SPONGE COUNTING (BAG) ×1
CANISTER SUCT 3000ML PPV (MISCELLANEOUS) ×4 IMPLANT
CATH ROBINSON RED A/P 18FR (CATHETERS) ×8 IMPLANT
CLIP VESOCCLUDE MED 24/CT (CLIP) ×4 IMPLANT
CLIP VESOCCLUDE SM WIDE 24/CT (CLIP) ×4 IMPLANT
COVER PROBE W GEL 5X96 (DRAPES) ×4 IMPLANT
DERMABOND ADVANCED (GAUZE/BANDAGES/DRESSINGS) ×4
DERMABOND ADVANCED .7 DNX12 (GAUZE/BANDAGES/DRESSINGS) ×4 IMPLANT
DRAPE ISOLATION BAG 18X18 (DRAPES) ×8
ELECT BLADE 4.0 EZ CLEAN MEGAD (MISCELLANEOUS) ×4
ELECT BLADE 6.5 EXT (BLADE) IMPLANT
ELECT REM PT RETURN 9FT ADLT (ELECTROSURGICAL) ×4
ELECTRODE BLDE 4.0 EZ CLN MEGD (MISCELLANEOUS) ×2 IMPLANT
ELECTRODE REM PT RTRN 9FT ADLT (ELECTROSURGICAL) ×2 IMPLANT
FELT TEFLON 1X6 (MISCELLANEOUS) ×8 IMPLANT
GAUZE 4X4 16PLY ~~LOC~~+RFID DBL (SPONGE) ×4 IMPLANT
GLOVE SURG POLYISO LF SZ8 (GLOVE) ×4 IMPLANT
GLOVE SURG UNDER POLY LF SZ6.5 (GLOVE) ×36 IMPLANT
GLOVE SURG UNDER POLY LF SZ7 (GLOVE) ×8 IMPLANT
GOWN STRL REUS W/ TWL LRG LVL3 (GOWN DISPOSABLE) ×4 IMPLANT
GOWN STRL REUS W/ TWL XL LVL3 (GOWN DISPOSABLE) ×2 IMPLANT
GOWN STRL REUS W/TWL LRG LVL3 (GOWN DISPOSABLE) ×8
GOWN STRL REUS W/TWL XL LVL3 (GOWN DISPOSABLE) ×4
GRAFT HEMASHIELD 18X9MM (Vascular Products) ×4 IMPLANT
HEMOSTAT SNOW SURGICEL 2X4 (HEMOSTASIS) ×16 IMPLANT
INSERT FOGARTY 61MM (MISCELLANEOUS) ×4 IMPLANT
INSERT FOGARTY SM (MISCELLANEOUS) ×8 IMPLANT
KIT BASIN OR (CUSTOM PROCEDURE TRAY) ×4 IMPLANT
KIT TURNOVER KIT B (KITS) ×4 IMPLANT
LOOP VESSEL MINI RED (MISCELLANEOUS) ×4 IMPLANT
NS IRRIG 1000ML POUR BTL (IV SOLUTION) ×8 IMPLANT
PACK AORTA (CUSTOM PROCEDURE TRAY) ×4 IMPLANT
PAD ARMBOARD 7.5X6 YLW CONV (MISCELLANEOUS) ×8 IMPLANT
SIZER VASCULAR GRAFT LG 24-38 (SIZER) ×4 IMPLANT
SUT MNCRL AB 4-0 PS2 18 (SUTURE) ×16 IMPLANT
SUT PDS AB 1 TP1 54 (SUTURE) ×4 IMPLANT
SUT PDS AB 1 TP1 96 (SUTURE) ×8 IMPLANT
SUT PROLENE 3 0 SH1 36 (SUTURE) ×52 IMPLANT
SUT PROLENE 4 0 RB 1 (SUTURE) ×8
SUT PROLENE 4-0 RB1 .5 CRCL 36 (SUTURE) ×4 IMPLANT
SUT PROLENE 5 0 C 1 24 (SUTURE) ×8 IMPLANT
SUT PROLENE 5 0 C 1 36 (SUTURE) ×16 IMPLANT
SUT PROLENE 6 0 BV (SUTURE) ×4 IMPLANT
SUT SILK 2 0 (SUTURE) ×4
SUT SILK 2 0 SH (SUTURE) ×4 IMPLANT
SUT SILK 2 0 TIES 17X18 (SUTURE) ×4
SUT SILK 2 0SH CR/8 30 (SUTURE) ×4 IMPLANT
SUT SILK 2-0 18XBRD TIE 12 (SUTURE) ×2 IMPLANT
SUT SILK 2-0 18XBRD TIE BLK (SUTURE) ×2 IMPLANT
SUT SILK 3 0 (SUTURE) ×4
SUT SILK 3 0 TIES 17X18 (SUTURE) ×4
SUT SILK 3-0 18XBRD TIE 12 (SUTURE) ×2 IMPLANT
SUT SILK 3-0 18XBRD TIE BLK (SUTURE) ×2 IMPLANT
SUT VIC AB 0 CT1 18XCR BRD 8 (SUTURE) ×4 IMPLANT
SUT VIC AB 0 CT1 8-18 (SUTURE) ×8
SUT VIC AB 2-0 CT1 27 (SUTURE) ×8
SUT VIC AB 2-0 CT1 TAPERPNT 27 (SUTURE) ×4 IMPLANT
SUT VIC AB 3-0 SH 27 (SUTURE)
SUT VIC AB 3-0 SH 27X BRD (SUTURE) IMPLANT
SYR BULB IRRIG 60ML STRL (SYRINGE) ×4 IMPLANT
TOWEL GREEN STERILE (TOWEL DISPOSABLE) ×8 IMPLANT
TOWEL ~~LOC~~+RFID 17X26 BLUE (SPONGE) ×4 IMPLANT
TRAY FOLEY MTR SLVR 16FR STAT (SET/KITS/TRAYS/PACK) ×4 IMPLANT
WATER STERILE IRR 1000ML POUR (IV SOLUTION) ×8 IMPLANT

## 2021-02-06 NOTE — Interval H&P Note (Signed)
History and Physical Interval Note:  02/06/2021 7:25 AM  Brandon Lara  has presented today for surgery, with the diagnosis of aortoiliac occlusive disease.  The various methods of treatment have been discussed with the patient and family. After consideration of risks, benefits and other options for treatment, the patient has consented to  Procedure(s): AORTA BIFEMORAL BYPASS GRAFT (Bilateral) as a surgical intervention.  The patient's history has been reviewed, patient examined, no change in status, stable for surgery.  I have reviewed the patient's chart and labs.  Questions were answered to the patient's satisfaction.     Cherre Robins

## 2021-02-06 NOTE — Anesthesia Procedure Notes (Addendum)
Central Venous Catheter Insertion Performed by: Catalina Gravel, MD, anesthesiologist Start/End9/06/2020 6:40 AM, 02/06/2021 6:50 AM Preanesthetic checklist: patient identified, IV checked, site marked, risks and benefits discussed, surgical consent, monitors and equipment checked, pre-op evaluation, timeout performed and anesthesia consent Position: Trendelenburg Lidocaine 1% used for infiltration and patient sedated Hand hygiene performed , maximum sterile barriers used  and Seldinger technique used Catheter size: 8 Fr Total catheter length 16. Central line was placed.Double lumen Procedure performed using ultrasound guided technique. Ultrasound Notes:anatomy identified, needle tip was noted to be adjacent to the nerve/plexus identified, no ultrasound evidence of intravascular and/or intraneural injection and image(s) printed for medical record Attempts: 1 Following insertion, line sutured, dressing applied and Biopatch. Post procedure assessment: blood return through all ports, free fluid flow and no air  Patient tolerated the procedure well with no immediate complications.

## 2021-02-06 NOTE — Transfer of Care (Signed)
Immediate Anesthesia Transfer of Care Note  Patient: Brandon Lara  Procedure(s) Performed: AORTA BIFEMORAL BYPASS GRAFT with Omentopexy (Bilateral) APPLICATION OF CELL SAVER (Abdomen)  Patient Location: PACU  Anesthesia Type:General  Level of Consciousness: awake, alert  and oriented  Airway & Oxygen Therapy: Patient Spontanous Breathing  Post-op Assessment: Report given to RN, Post -op Vital signs reviewed and stable and Patient moving all extremities X 4  Post vital signs: Reviewed and stable  Last Vitals:  Vitals Value Taken Time  BP 220/107 02/06/21 1308  Temp 36.6 C 02/06/21 1307  Pulse 81 02/06/21 1317  Resp 20 02/06/21 1317  SpO2 99 % 02/06/21 1317  Vitals shown include unvalidated device data.  Last Pain:  Vitals:   02/06/21 0612  TempSrc:   PainSc: 0-No pain      Patients Stated Pain Goal: 3 ([id number])  Complications: No notable events documented.

## 2021-02-06 NOTE — Anesthesia Procedure Notes (Signed)
Arterial Line Insertion Start/End9/06/2020 6:55 AM, 02/06/2021 7:05 AM Performed by: Catalina Gravel, MD, anesthesiologist  Patient location: Pre-op. Preanesthetic checklist: patient identified, IV checked, site marked, risks and benefits discussed, surgical consent, monitors and equipment checked, pre-op evaluation, timeout performed and anesthesia consent Lidocaine 1% used for infiltration Left, radial was placed Catheter size: 20 G Hand hygiene performed  and maximum sterile barriers used   Attempts: 1 Procedure performed using ultrasound guided technique. Ultrasound Notes:anatomy identified, needle tip was noted to be adjacent to the nerve/plexus identified, no ultrasound evidence of intravascular and/or intraneural injection and image(s) printed for medical record Following insertion, dressing applied and Biopatch. Post procedure assessment: normal and unchanged  Post procedure complications: second provider assisted. Patient tolerated the procedure well with no immediate complications.

## 2021-02-06 NOTE — Op Note (Signed)
DATE OF SERVICE: 02/06/2021  PATIENT:  Brandon Lara  60 y.o. male  PRE-OPERATIVE DIAGNOSIS: Aortoiliac occlusive disease causing ischemic rest pain  POST-OPERATIVE DIAGNOSIS:  Same  PROCEDURE:   Aortobifemoral bypass graft (18 x 9 mm dacron) Omentopexy  SURGEON:  Surgeon(s) and Role:    * Cherre Robins, MD - Primary  ASSISTANT: Arlee Muslim, PAC  An assistant was required to facilitate exposure and expedite the case.  ANESTHESIA:   general  EBL: 1.9L  BLOOD ADMINISTERED: 900 CC CELLSAVER  URINE OUTPUT: 215m  DRAINS: none   LOCAL MEDICATIONS USED:  NONE  SPECIMEN:  none  COUNTS: confirmed correct.  TOURNIQUET:  none  PATIENT DISPOSITION:  ICU - extubated and stable.   Delay start of Pharmacological VTE agent (>24hrs) due to surgical blood loss or risk of bleeding: no  INDICATION FOR PROCEDURE: Brandon ELZAis a 60y.o. male with aortoiliac occlusive disease causing ischemic rest pain or lower extremities.  CT angiogram was performed which revealed severe aortoiliac occlusive disease. After careful discussion of risks, benefits, and alternatives the patient was offered aortobifemoral bypass graft. We specifically discussed risk of major complication and death.. The patient understood and wished to proceed.  OPERATIVE FINDINGS: Infrarenal aorta suitable for side biting aortic clamp.  Proximal anastomosis performed and with felt reinforced 3-0 Prolene suture.  Multiple pledgeted pair sutures required for hemostasis.  Distal aortic stump oversewn proximal to IMA.  Distal anastomoses were performed end-to-side to the common femoral artery.  Upon completion excellent Doppler flow in the profunda and superficial femoral arteries bilaterally.  DESCRIPTION OF PROCEDURE: After identification of the patient in the pre-operative holding area, the patient was transferred to the operating room. The patient was positioned supine on the operating room table. Anesthesia was  induced. The abdomen, groins, and bilateral circumferential lower extremities were prepped and draped in standard fashion. A surgical pause was performed confirming correct patient, procedure, and operative location.  Intraoperative ultrasound was used to map the course of the femoral arteries bilaterally.  Oblique incisions were made over the groins and carried down through subtenons tissue until the femoral sheath was encountered.  The femoral sheath was divided longitudinally.  The femoral vessels were identified and skeletonized.  Small Vesseloops were looped around the superficial femoral and profunda femoris arteries bilaterally.  Exposure was carried cranially onto the external iliac artery.  Circumflex iliac vein was identified bilaterally and ligated to allow retroperitoneal tunnel creation.  Tunnels were started from groin exposures.  Attention was turned the abdomen.  Laparotomy was made from xiphoid to suprapubic abdomen.  Incision was carried down through subtenons tissue until the anterior rectus fascia was identified and entered in the midline.  The peritoneum was sharply entered and the viscera protected while the remainder of the abdomen was opened with Bovie electrocautery.  The transverse colon was eviscerated and placed cranially.  Saline moistened towels were used to protect the abdominal walls.  A Balfour retractor was used to expose the abdominal contents.  The small bowel was eviscerated to the patient's right.  A saline moistened laparotomy pad was used to protect the small bowel.  The aorta was palpated in the midline.  Bovie electrocautery was carried down through the retroperitoneum until the anterior surface of the aorta was identified.  The anterior surface was exposed from the takeoff of the renal arteries to the takeoff of the common iliac arteries.  Great care was taken to prevent injury to the inferior mesenteric artery.  An  Omni self-retaining retraction system was brought onto  the field.  Offense was used to hold the small bowel to the right.  The duodenum was sharply mobilized from the retroperitoneum taking great care to avoid injury.  The renal vein was identified.  270 degrees of exposure were achieved immediately distal to the renal arteries.  A side-biting aortic clamp was slotted onto the vertebral bodies to allow for proximal control.  Similarly distal control was achieved proximal to the IMA.  Retroperitoneal tunnels were created over the anterior surface of the iliac vessels using blunt digital dissection.  Great care was taken to avoid entrapping the ureters.  Red rubber catheters were delivered through these tunnels and secured outside of the body or later use.  The patient was systemically heparinized.  Activated clotting time measurements were used throughout the case to confirm adequate anticoagulation.  Of note the patient required a lot more bread than typical.  We were able to achieve therapeutic levels.  After careful communication with the anesthesia team, the aorta was crossclamped.  The aorta was transected 2 cm distal to our clamp.  A partial aortic endarterectomy was performed to allow end-to-side anastomosis with a 18 x 9 mm bifurcated Dacron graft.  This was done with a felt strip reinforcing the suture line.  3-0 Prolene was used in continuous running technique.  Prior to completion the graft was flushed with saline.  Any points of leak were repaired with pledgeted 3-0 Prolene suture.  The clamp was slowly released from the renal aorta.  Hemostasis was achieved after several more pledgeted sutures.  The distal aortic stump was then oversewn in 2 layers using 3-0 Prolene suture.  Was confirmed in the retroperitoneum.  The 2 limbs of the graft were delivered through the retroperitoneal tunnels previously created.  They were delivered into the red rubber catheters and secured with a silk suture.  They were delivered through the RP tunnels taking great care to  avoid twisting or kinking the graft.  Clamps were applied to the individual limbs.  The femoral anastomoses were performed in the same manner bilaterally, right followed by left.  The external iliac artery was clamped with a Henley clamp.  The superficial femoral artery femoris arteries were clamped with Silastic Vesseloops.  A longitudinal arteriotomy was made with 11 blade extended with Potts scissors.  The graft was stretched to its final length and spatulated to allow end-to-side anastomosis to our arteriotomy.  This was done with continuous running suture of 5-0 Prolene.  Immediately prior to completion the anastomosis was flushed and de-aired.  Clamps released on the external iliac artery.  Clamp was released on the inflow graft.  Hemostasis was confirmed in the anastomosis.  Distal clamps were released.  Good Doppler flow was confirmed in the profunda femoris and superficial femoral artery.  The process was repeated on the left.  The proximal anastomosis was again inspected.  1 area of leak was identified again in the left upper quadrant.  This was repaired with pledgeted suture.  A palpable pulse was noted throughout the graft.  Excellent Doppler flow was noted in the outflow vessels.  Hemostasis was noted in all anastomoses.  Satisfied heparin was reversed with protamine.  Hemostasis was again achieved in the retroperitoneum.  A tongue of omentum was mobilized using Bovie electrocautery from the transverse colon.  A small window was made in the transverse colon mesentery bluntly.  The omentum was delivered through the transverse mesocolon to allow to lay in the  retroperitoneum space to form a layer between the graft and the small bowel.  This was tacked in place with several interrupted 3-0 Vicryl sutures.  The visceral contents were returned to the abdomen.  Hemostasis was again confirmed in the surgical beds.  The groins were closed in layers using 2-0 Vicryl, 3-0 Vicryl, 4-0 Monocryl.  The abdomen  was closed using interrupted 0 Vicryl internal retention sutures, #1 single armed PDS suture for the anterior rectus fascia, and a 4-0 Monocryl subcuticular closure.  Dermabond was applied.  Upon completion of the case instrument and sharps counts were confirmed correct. The patient was transferred to the ICU in good condition. I was present for all portions of the procedure.  Yevonne Aline. Stanford Breed, MD [facility] Phone Number: [phone] 02/06/2021 12:54 PM

## 2021-02-06 NOTE — Anesthesia Procedure Notes (Signed)

## 2021-02-06 NOTE — Anesthesia Postprocedure Evaluation (Signed)
Anesthesia Post Note  Patient: Brandon Lara  Procedure(s) Performed: AORTA BIFEMORAL BYPASS GRAFT with Omentopexy (Bilateral) APPLICATION OF CELL SAVER (Abdomen)     Patient location during evaluation: PACU Anesthesia Type: General Level of consciousness: awake and alert Pain management: pain level controlled Vital Signs Assessment: post-procedure vital signs reviewed and stable Respiratory status: spontaneous breathing, nonlabored ventilation and respiratory function stable Cardiovascular status: blood pressure returned to baseline and stable Postop Assessment: no apparent nausea or vomiting Anesthetic complications: no   No notable events documented.  Last Vitals:  Vitals:   02/06/21 1500 02/06/21 1515  BP:    Pulse: 71 75  Resp: 15 16  Temp:    SpO2: 97% 98%    Last Pain:  Vitals:   02/06/21 1445  TempSrc: Oral  PainSc: 2                  Catalina Gravel

## 2021-02-07 ENCOUNTER — Inpatient Hospital Stay (HOSPITAL_COMMUNITY): Payer: BC Managed Care – PPO

## 2021-02-07 LAB — POCT I-STAT 7, (LYTES, BLD GAS, ICA,H+H)
Acid-Base Excess: 6 mmol/L — ABNORMAL HIGH (ref 0.0–2.0)
Acid-Base Excess: 4 mmol/L — ABNORMAL HIGH (ref 0.0–2.0)
Acid-Base Excess: 4 mmol/L — ABNORMAL HIGH (ref 0.0–2.0)
Bicarbonate: 31.2 mmol/L — ABNORMAL HIGH (ref 20.0–28.0)
Bicarbonate: 29.7 mmol/L — ABNORMAL HIGH (ref 20.0–28.0)
Bicarbonate: 29.8 mmol/L — ABNORMAL HIGH (ref 20.0–28.0)
Calcium, Ion: 1.12 mmol/L — ABNORMAL LOW (ref 1.15–1.40)
Calcium, Ion: 1.12 mmol/L — ABNORMAL LOW (ref 1.15–1.40)
Calcium, Ion: 1.08 mmol/L — ABNORMAL LOW (ref 1.15–1.40)
HCT: 41 % (ref 39.0–52.0)
HCT: 33 % — ABNORMAL LOW (ref 39.0–52.0)
HCT: 35 % — ABNORMAL LOW (ref 39.0–52.0)
Hemoglobin: 13.9 g/dL (ref 13.0–17.0)
Hemoglobin: 11.2 g/dL — ABNORMAL LOW (ref 13.0–17.0)
Hemoglobin: 11.9 g/dL — ABNORMAL LOW (ref 13.0–17.0)
O2 Saturation: 100 %
O2 Saturation: 100 %
O2 Saturation: 100 %
Patient temperature: 35.7
Patient temperature: 35.7
Potassium: 3.3 mmol/L — ABNORMAL LOW (ref 3.5–5.1)
Potassium: 3.9 mmol/L (ref 3.5–5.1)
Potassium: 3.5 mmol/L (ref 3.5–5.1)
Sodium: 132 mmol/L — ABNORMAL LOW (ref 135–145)
Sodium: 134 mmol/L — ABNORMAL LOW (ref 135–145)
Sodium: 135 mmol/L (ref 135–145)
TCO2: 33 mmol/L — ABNORMAL HIGH (ref 22–32)
TCO2: 31 mmol/L (ref 22–32)
TCO2: 31 mmol/L (ref 22–32)
pCO2 arterial: 43.1 mmHg (ref 32.0–48.0)
pCO2 arterial: 50.2 mmHg — ABNORMAL HIGH (ref 32.0–48.0)
pCO2 arterial: 46.1 mmHg (ref 32.0–48.0)
pH, Arterial: 7.462 — ABNORMAL HIGH (ref 7.350–7.450)
pH, Arterial: 7.38 (ref 7.350–7.450)
pH, Arterial: 7.413 (ref 7.350–7.450)
pO2, Arterial: 355 mmHg — ABNORMAL HIGH (ref 83.0–108.0)
pO2, Arterial: 261 mmHg — ABNORMAL HIGH (ref 83.0–108.0)
pO2, Arterial: 240 mmHg — ABNORMAL HIGH (ref 83.0–108.0)

## 2021-02-07 LAB — COMPREHENSIVE METABOLIC PANEL
ALT: 17 U/L (ref 0–44)
AST: 24 U/L (ref 15–41)
Albumin: 2.9 g/dL — ABNORMAL LOW (ref 3.5–5.0)
Alkaline Phosphatase: 44 U/L (ref 38–126)
Anion gap: 9 (ref 5–15)
BUN: 12 mg/dL (ref 6–20)
CO2: 26 mmol/L (ref 22–32)
Calcium: 8 mg/dL — ABNORMAL LOW (ref 8.9–10.3)
Chloride: 99 mmol/L (ref 98–111)
Creatinine, Ser: 0.85 mg/dL (ref 0.61–1.24)
GFR, Estimated: >60 mL/min (ref >60)
Glucose, Bld: 140 mg/dL — ABNORMAL HIGH (ref 70–99)
Potassium: 3.9 mmol/L (ref 3.5–5.1)
Sodium: 134 mmol/L — ABNORMAL LOW (ref 135–145)
Total Bilirubin: 0.5 mg/dL (ref 0.3–1.2)
Total Protein: 5 g/dL — ABNORMAL LOW (ref 6.5–8.1)

## 2021-02-07 LAB — CBC
HCT: 39.9 % (ref 39.0–52.0)
Hemoglobin: 13.3 g/dL (ref 13.0–17.0)
MCH: 32.7 pg (ref 26.0–34.0)
MCHC: 33.3 g/dL (ref 30.0–36.0)
MCV: 98 fL (ref 80.0–100.0)
Platelets: 580 10*3/uL — ABNORMAL HIGH (ref 150–400)
RBC: 4.07 MIL/uL — ABNORMAL LOW (ref 4.22–5.81)
RDW: 13.5 % (ref 11.5–15.5)
WBC: 22.8 10*3/uL — ABNORMAL HIGH (ref 4.0–10.5)
nRBC: 0 % (ref 0.0–0.2)

## 2021-02-07 LAB — POCT ACTIVATED CLOTTING TIME
Activated Clotting Time: 202 seconds
Activated Clotting Time: 231 seconds
Activated Clotting Time: 242 seconds
Activated Clotting Time: 265 seconds
Activated Clotting Time: 225 seconds
Activated Clotting Time: 231 seconds
Activated Clotting Time: 248 seconds

## 2021-02-07 LAB — MAGNESIUM: Magnesium: 1.8 mg/dL (ref 1.7–2.4)

## 2021-02-07 LAB — AMYLASE: Amylase: 71 U/L (ref 28–100)

## 2021-02-07 MED ORDER — OXYCODONE HCL 5 MG PO TABS
5.0000 mg | ORAL_TABLET | ORAL | Status: DC | PRN
  Administered 2021-02-07: 5 mg via ORAL
  Administered 2021-02-07: 10 mg via ORAL
  Filled 2021-02-07: qty 2
  Filled 2021-02-07: qty 1

## 2021-02-07 MED ORDER — ACETAMINOPHEN 325 MG PO TABS
650.0000 mg | ORAL_TABLET | Freq: Four times a day (QID) | ORAL | Status: DC
  Administered 2021-02-07 – 2021-02-11 (×14): 650 mg via ORAL
  Filled 2021-02-07 (×16): qty 2

## 2021-02-07 NOTE — Plan of Care (Signed)

## 2021-02-07 NOTE — Progress Notes (Signed)
[facility] PROGRESS NOTE  ASSESSMENT / PLAN: Brandon Lara is a 60 y.o. male s/p aortobifemoral bypass 02/06/21.   CNS: PRN pain control. PT / OT / OOB / Ambulate.  CV: Keep SBP 100-160. Good doppler flow in feet. Pulm:  pulmonary hygiene: IS / OOB. GI: OK for sips. FEN/GU: Voiding trial. Heme: blood counts stable. no need for transfusion.  ID: Improving leukocytosis. No fever. Leukocytosis is chronic and likely related to smoking.   Endo: BG goal 120-180. Prophy: SQH. SCDs. pepcid.     SUBJECTIVE: Looks great. Pain well controlled. No flatus / BM.   OBJECTIVE: BP 126/90   Pulse 82   Temp (!) 97.5 F (36.4 C) (Oral)   Resp 20   Ht '5\' 6"'$  (1.676 m)   Wt 56.7 kg   SpO2 96%   BMI 20.18 kg/m   Intake/Output Summary (Last 24 hours) at 02/07/2021 0728 Last data filed at 02/07/2021 0600 Gross per 24 hour  Intake 5457.63 ml  Output 2400 ml  Net 3057.63 ml    Constitutional: well appearing. no acute distress. CNS: normal motor / sensory function Cardiac: RRR. Pulmonary: unlabored Abdomen: soft Vascular: doppler flow in feet.  CBC Latest Ref Rng & Units 02/07/2021 02/06/2021 02/06/2021  WBC 4.0 - 10.5 K/uL 22.8(H) - 26.9(H)  Hemoglobin 13.0 - 17.0 g/dL 13.3 14.6 14.5  Hematocrit 39.0 - 52.0 % 39.9 43.0 41.9  Platelets 150 - 400 K/uL 580(H) - 623(H)     CMP Latest Ref Rng & Units 02/07/2021 02/06/2021 02/06/2021  Glucose 70 - 99 mg/dL 140(H) - 178(H)  BUN 6 - 20 mg/dL 12 - 14  Creatinine 0.61 - 1.24 mg/dL 0.85 - 0.79  Sodium 135 - 145 mmol/L 134(L) 136 135  Potassium 3.5 - 5.1 mmol/L 3.9 3.8 3.8  Chloride 98 - 111 mmol/L 99 - 101  CO2 22 - 32 mmol/L 26 - 25  Calcium 8.9 - 10.3 mg/dL 8.0(L) - 8.0(L)  Total Protein 6.5 - 8.1 g/dL 5.0(L) - -  Total Bilirubin 0.3 - 1.2 mg/dL 0.5 - -  Alkaline Phos 38 - 126 U/L 44 - -  AST 15 - 41 U/L 24 - -  ALT 0 - 44 U/L 17 - -    Estimated Creatinine Clearance: 75 mL/min (by C-G formula based on SCr of 0.85  mg/dL).  Yevonne Aline. Stanford Breed, MD [facility] Phone Number: [phone] 02/07/2021 7:28 AM

## 2021-02-07 NOTE — Evaluation (Signed)
Physical Therapy Evaluation Patient Details Name: Brandon Lara MRN: [id number] DOB: 04-19-1961 Today's Date: 02/07/2021   History of Present Illness  The pt is a 60 yo male presenting for aortobifemoral bypass on 9/1 due to PAD and RLE ischemia. PMH includes: PAD, tobacco and alcohol use, COPD, and HTN.   Clinical Impression  Pt in bed upon arrival of PT, agreeable to evaluation at this time. Prior to admission the pt was completely independent with mobility without use of AD, was living alone and working full time. The pt now presents with minor limitations in functional mobility, endurance, and dynamic stability due to above dx, and will continue to benefit from skilled PT to address these deficits. The pt was able to complete sit-stand transfers with minG but no physical assist, but required BUE support on RW for stability with hallway ambulation. Expect he will progress well and be safe to return home with intermittent family/friend assist when medically stable for d/c. Will continue to benefit from skilled PT to progress dynamic stability to reduce dependence on RW, and to complete stair training prior to anticipated d/c home.      Follow Up Recommendations Home health PT;Supervision - Intermittent (vs no PT follow up pending progression)    Equipment Recommendations  None recommended by PT    Recommendations for Other Services       Precautions / Restrictions Precautions Precautions: Fall Precaution Comments: low fall Restrictions Weight Bearing Restrictions: No      Mobility  Bed Mobility               General bed mobility comments: pt OOB in recliner at start and end of session    Transfers Overall transfer level: Needs assistance Equipment used: None Transfers: Sit to/from Stand Sit to Stand: Min guard         General transfer comment: cues for hand placement on armrests, pt able to rise and steady without physial assist or  LOB  Ambulation/Gait Ambulation/Gait assistance: Min guard Gait Distance (Feet): 270 Feet Assistive device: Rolling walker (2 wheeled) Gait Pattern/deviations: Step-through pattern;Decreased stride length;Trunk flexed Gait velocity: slightly decreased Gait velocity interpretation: 1.31 - 2.62 ft/sec, indicative of limited community ambulator General Gait Details: pt with slight trunk flexion for comfort, increased cues for proximity to RW, no overt LOB, HR to 120     Balance Overall balance assessment: Mild deficits observed, not formally tested                                           Pertinent Vitals/Pain Pain Assessment: 0-10 Pain Score: 2  Pain Location: low abdomen Pain Descriptors / Indicators: Sore Pain Intervention(s): Limited activity within patient's tolerance;Monitored during session;Repositioned;Patient requesting pain meds-RN notified    Home Living Family/patient expects to be discharged to:: Private residence Living Arrangements: Alone Available Help at Discharge: Family;Friend(s);Available PRN/intermittently Type of Home: House Home Access: Stairs to enter Entrance Stairs-Rails: None Entrance Stairs-Number of Steps: 2 Home Layout: One level Home Equipment: Walker - 2 wheels;Walker - standard      Prior Function Level of Independence: Independent         Comments: still driving, not working     Hand Dominance   Dominant Hand: Right    Extremity/Trunk Assessment   Upper Extremity Assessment Upper Extremity Assessment: Overall WFL for tasks assessed    Lower Extremity Assessment Lower Extremity Assessment: RLE  deficits/detail RLE Deficits / Details: strength WFL, numbness R toes on plantar and dorsal surface of distal half of foot (about to end of pt arch) RLE Sensation: decreased light touch (same as before surgery but without the pain) RLE Coordination: WNL    Cervical / Trunk Assessment Cervical / Trunk Assessment:  Normal  Communication   Communication: No difficulties  Cognition Arousal/Alertness: Awake/alert Behavior During Therapy: WFL for tasks assessed/performed Overall Cognitive Status: Within Functional Limits for tasks assessed                                        General Comments General comments (skin integrity, edema, etc.): vss on RA        Assessment/Plan    PT Assessment Patient needs continued PT services  PT Problem List Decreased range of motion;Decreased activity tolerance;Decreased balance;Decreased mobility;Decreased knowledge of use of DME       PT Treatment Interventions DME instruction;Gait training;Functional mobility training;Stair training;Therapeutic activities;Therapeutic exercise;Balance training;Patient/family education    PT Goals (Current goals can be found in the Care Plan section)  Acute Rehab PT Goals Patient Stated Goal: return home, not need RW PT Goal Formulation: With patient Time For Goal Achievement: 02/21/21 Potential to Achieve Goals: Good    Frequency Min 3X/week    AM-PAC PT "6 Clicks" Mobility  Outcome Measure Help needed turning from your back to your side while in a flat bed without using bedrails?: A Little Help needed moving from lying on your back to sitting on the side of a flat bed without using bedrails?: A Little Help needed moving to and from a bed to a chair (including a wheelchair)?: A Little Help needed standing up from a chair using your arms (e.g., wheelchair or bedside chair)?: A Little Help needed to walk in hospital room?: A Little Help needed climbing 3-5 steps with a railing? : A Little 6 Click Score: 18    End of Session   Activity Tolerance: Patient tolerated treatment well Patient left: in chair;with call bell/phone within reach;with family/visitor present Nurse Communication: Mobility status PT Visit Diagnosis: Other abnormalities of gait and mobility (R26.89)    Time: [id number] PT Time  Calculation (min) (ACUTE ONLY): 26 min   Charges:   PT Evaluation $PT Eval Low Complexity: 1 Low PT Treatments $Gait Training: 8-22 mins        West Carbo, PT, DPT   Acute Rehabilitation Department Pager #: [phone]  Sandra Cockayne 02/07/2021, 3:06 PM

## 2021-02-07 NOTE — Evaluation (Signed)
Occupational Therapy Evaluation Patient Details Name: Brandon Lara MRN: [id number] DOB: 1960/11/17 Today's Date: 02/07/2021    History of Present Illness 60 yo male presenting for aortobifemoral bypass on 9/1 due to PAD and RLE ischemia. PMH includes: PAD, tobacco and alcohol use, COPD, and HTN.   Clinical Impression   PTA, pt was living alone and was independent. Pt currently performing ADLs and functional mobility at Mod I level with increased time as needed. Reporting soreness at abdomen post sx. Providing education on compensatory techniques to prevent bending forward and thus reduce pain during ADLs. Pt demonstrated and verbalized understanding. Pt would benefit from further acute OT to facilitate safe dc. Recommend dc to home once medically stable per physician. All acute OT needs met and will sign off.      Follow Up Recommendations  No OT follow up;Supervision - Intermittent    Equipment Recommendations  None recommended by OT    Recommendations for Other Services PT consult     Precautions / Restrictions Precautions Precautions: Fall Precaution Comments: low fall Restrictions Weight Bearing Restrictions: No      Mobility Bed Mobility               General bed mobility comments: In recliner upon arrival    Transfers Overall transfer level: Modified independent Equipment used: None Transfers: Sit to/from Stand Sit to Stand: Min guard         General transfer comment: cues for hand placement on armrests, pt able to rise and steady without physial assist or LOB    Balance Overall balance assessment: Mild deficits observed, not formally tested                                         ADL either performed or assessed with clinical judgement   ADL Overall ADL's : Modified independent                                       General ADL Comments: Pt performing ADLs and functional mobility at Mod I level with increased  time for soreness. Providing education on compensatory tecniques to prevent forward bending thus decreasing pain.     Vision Baseline Vision/History: 1 Wears glasses       Perception     Praxis      Pertinent Vitals/Pain Pain Assessment: 0-10 Pain Score: 2  Pain Location: low abdomen Pain Descriptors / Indicators: Sore Pain Intervention(s): Monitored during session;Repositioned     Hand Dominance Right   Extremity/Trunk Assessment Upper Extremity Assessment Upper Extremity Assessment: Overall WFL for tasks assessed   Lower Extremity Assessment Lower Extremity Assessment: Defer to PT evaluation RLE Deficits / Details: strength WFL, numbness R toes on plantar and dorsal surface of distal half of foot (about to end of pt arch) RLE Sensation: decreased light touch (same as before surgery but without the pain) RLE Coordination: WNL   Cervical / Trunk Assessment Cervical / Trunk Assessment: Normal;Other exceptions Cervical / Trunk Exceptions: Slight soreness at abdomen   Communication Communication Communication: No difficulties   Cognition Arousal/Alertness: Awake/alert Behavior During Therapy: WFL for tasks assessed/performed Overall Cognitive Status: Within Functional Limits for tasks assessed  General Comments  VSS on RA    Exercises     Shoulder Instructions      Home Living Family/patient expects to be discharged to:: Private residence Living Arrangements: Alone Available Help at Discharge: Family;Friend(s);Available PRN/intermittently Type of Home: House Home Access: Stairs to enter [employer] of Steps: 2 Entrance Stairs-Rails: None Home Layout: One level     Bathroom Shower/Tub: Tub/shower unit;Walk-in shower   Bathroom Toilet: Standard     Home Equipment: Environmental consultant - 2 wheels;Walker - standard          Prior Functioning/Environment Level of Independence: Independent         Comments: still driving; works as a Arboriculturist Problem List: Decreased range of motion;Impaired balance (sitting and/or standing);Decreased activity tolerance;Decreased safety awareness;Decreased knowledge of use of DME or AE;Pain      OT Treatment/Interventions: Self-care/ADL training;Therapeutic exercise;Energy conservation;DME and/or AE instruction;Therapeutic activities;Patient/family education    OT Goals(Current goals can be found in the care plan section) Acute Rehab OT Goals Patient Stated Goal: Return home OT Goal Formulation: All assessment and education complete, DC therapy  OT Frequency: Min 2X/week   Barriers to D/C:            Co-evaluation              AM-PAC OT "6 Clicks" Daily Activity     Outcome Measure Help from another person eating meals?: None Help from another person taking care of personal grooming?: None Help from another person toileting, which includes using toliet, bedpan, or urinal?: None Help from another person bathing (including washing, rinsing, drying)?: None Help from another person to put on and taking off regular upper body clothing?: None Help from another person to put on and taking off regular lower body clothing?: None 6 Click Score: 24   End of Session Nurse Communication: Mobility status  Activity Tolerance: Patient tolerated treatment well Patient left: with call bell/phone within reach;in bed;with bed alarm set  OT Visit Diagnosis: Unsteadiness on feet (R26.81);Other abnormalities of gait and mobility (R26.89);Muscle weakness (generalized) (M62.81);Pain Pain - part of body:  (Abdomen)                Time: 1423-9532 OT Time Calculation (min): 12 min Charges:  OT General Charges $OT Visit: 1 Visit OT Evaluation $OT Eval Moderate Complexity: 1 Mod  Cobi Delph MSOT, OTR/L Acute Rehab Pager: [phone] Office: [place] 02/07/2021, 4:29 PM

## 2021-02-08 LAB — CBC
HCT: 33.7 % — ABNORMAL LOW (ref 39.0–52.0)
Hemoglobin: 11.4 g/dL — ABNORMAL LOW (ref 13.0–17.0)
MCH: 33.2 pg (ref 26.0–34.0)
MCHC: 33.8 g/dL (ref 30.0–36.0)
MCV: 98.3 fL (ref 80.0–100.0)
Platelets: 462 10*3/uL — ABNORMAL HIGH (ref 150–400)
RBC: 3.43 MIL/uL — ABNORMAL LOW (ref 4.22–5.81)
RDW: 13.6 % (ref 11.5–15.5)
WBC: 25.5 10*3/uL — ABNORMAL HIGH (ref 4.0–10.5)
nRBC: 0 % (ref 0.0–0.2)

## 2021-02-08 LAB — BASIC METABOLIC PANEL
Anion gap: 5 (ref 5–15)
BUN: 7 mg/dL (ref 6–20)
CO2: 25 mmol/L (ref 22–32)
Calcium: 7.7 mg/dL — ABNORMAL LOW (ref 8.9–10.3)
Chloride: 106 mmol/L (ref 98–111)
Creatinine, Ser: 0.8 mg/dL (ref 0.61–1.24)
GFR, Estimated: >60 mL/min (ref >60)
Glucose, Bld: 113 mg/dL — ABNORMAL HIGH (ref 70–99)
Potassium: 3.8 mmol/L (ref 3.5–5.1)
Sodium: 136 mmol/L (ref 135–145)

## 2021-02-08 LAB — GLUCOSE, CAPILLARY: Glucose-Capillary: 116 mg/dL — ABNORMAL HIGH (ref 70–99)

## 2021-02-08 MED ORDER — LORAZEPAM 2 MG/ML IJ SOLN
1.0000 mg | Freq: Four times a day (QID) | INTRAMUSCULAR | Status: DC | PRN
  Administered 2021-02-08: 1 mg via INTRAVENOUS
  Filled 2021-02-08: qty 1

## 2021-02-08 MED ORDER — LORAZEPAM BOLUS VIA INFUSION: 1.0000 mg | Freq: Four times a day (QID) | INTRAVENOUS | Status: DC | PRN

## 2021-02-08 MED ORDER — DEXMEDETOMIDINE HCL IN NACL 400 MCG/100ML IV SOLN
0.2000 ug/kg/h | INTRAVENOUS | Status: DC
  Filled 2021-02-08: qty 100

## 2021-02-08 MED ORDER — ADULT MULTIVITAMIN W/MINERALS CH
1.0000 | ORAL_TABLET | Freq: Every day | ORAL | Status: DC
  Administered 2021-02-09 – 2021-02-10 (×2): 1 via ORAL
  Filled 2021-02-08 (×2): qty 1

## 2021-02-08 MED ORDER — THIAMINE HCL 100 MG/ML IJ SOLN
100.0000 mg | Freq: Every day | INTRAMUSCULAR | Status: DC
  Administered 2021-02-11 – 2021-02-13 (×3): 100 mg via INTRAVENOUS
  Filled 2021-02-08 (×3): qty 2

## 2021-02-08 MED ORDER — FOLIC ACID 1 MG PO TABS
1.0000 mg | ORAL_TABLET | Freq: Every day | ORAL | Status: DC
  Administered 2021-02-09 – 2021-02-11 (×3): 1 mg via ORAL
  Filled 2021-02-08 (×3): qty 1

## 2021-02-08 MED ORDER — THIAMINE HCL 100 MG PO TABS
100.0000 mg | ORAL_TABLET | Freq: Every day | ORAL | Status: DC
  Administered 2021-02-09 – 2021-02-14 (×3): 100 mg via ORAL
  Filled 2021-02-08 (×4): qty 1

## 2021-02-08 MED ORDER — LORAZEPAM 2 MG/ML IJ SOLN: 1.0000 mg | INTRAMUSCULAR | Status: AC | PRN

## 2021-02-08 MED ORDER — LORAZEPAM 1 MG PO TABS: 1.0000 mg | ORAL_TABLET | ORAL | Status: AC | PRN

## 2021-02-08 MED ORDER — FOLIC ACID 1 MG PO TABS
1.0000 mg | ORAL_TABLET | Freq: Every day | ORAL | Status: DC
  Administered 2021-02-08: 1 mg via ORAL
  Filled 2021-02-08: qty 1

## 2021-02-08 MED ORDER — ADULT MULTIVITAMIN W/MINERALS CH
1.0000 | ORAL_TABLET | Freq: Every day | ORAL | Status: DC
  Administered 2021-02-08: 1 via ORAL
  Filled 2021-02-08: qty 1

## 2021-02-08 MED ORDER — LORAZEPAM 2 MG/ML IJ SOLN: 1.0000 mg | Freq: Four times a day (QID) | INTRAMUSCULAR | Status: DC | PRN

## 2021-02-08 MED ORDER — THIAMINE HCL 100 MG PO TABS
100.0000 mg | ORAL_TABLET | Freq: Every day | ORAL | Status: DC
  Administered 2021-02-08: 100 mg via ORAL
  Filled 2021-02-08: qty 1

## 2021-02-08 MED FILL — Sodium Chloride IV Soln 0.9%: INTRAVENOUS | Qty: 1000 | Status: AC

## 2021-02-08 MED FILL — Heparin Sodium (Porcine) Inj 1000 Unit/ML: INTRAMUSCULAR | Qty: 30 | Status: AC

## 2021-02-08 NOTE — Progress Notes (Addendum)
[facility] [place]  Subjective  - Doing better each day.   Objective (!) 136/99 100 97.7 F (36.5 C) (Oral) 18 97%  Intake/Output Summary (Last 24 hours) at 02/08/2021 0947 Last data filed at 02/08/2021 0915 Gross per 24 hour  Intake 440 ml  Output 390 ml  Net 50 ml    Feet warm well perfused with palpable pedal pulses Abdomin compressible with + BS to auscultation Incisions healing well with out drainage, or hematoma Lungs non labored breathing Tachycardia  Assessment/Planning: POD #  2 s/p aortobifemoral bypass  Feet well perfused, pain well controlled Will start clear liquids 1200 ml restriction.  He has + BS , no flatus yet.    Ordered CIWA protocol for alcohol withdrawal. Mobility encouraged. Leukocytosis WBC 25.5 encouraged IS use, chest x ray 02/07/21 shows no active disease. Will observe Continue IV fluids HGB stable post op blood loss anemia stable  Urine OP 390 cc   Roxy Horseman 02/08/2021 9:47 AM --  Laboratory Lab Results: Recent Labs    02/07/21 0409 02/08/21 0100  WBC 22.8* 25.5*  HGB 13.3 11.4*  HCT 39.9 33.7*  PLT 580* 462*   BMET Recent Labs    02/07/21 0409 02/08/21 0100  NA 134* 136  K 3.9 3.8  CL 99 106  CO2 26 25  GLUCOSE 140* 113*  BUN 12 7  CREATININE 0.85 0.80  CALCIUM 8.0* 7.7*    COAG Lab Results  Component Value Date   INR 1.1 01/20/2021   No results found for: PTT  I have interviewed the patient and examined the patient. I agree with the findings by the PA. No flatus yet. A little distended. Will go slow with diet. Needs IS. Mobilize.   Gae Gallop, MD

## 2021-02-08 NOTE — Progress Notes (Signed)
Pt noted to be diaphoretic and having tremors. Blood sugar checked as pt has been NPO; 116. Pt asked about alcohol intake and states he drink a 6pk daily. Last drink was Tuesday night. MD paged for CIWA orders. CIWA score 8. New orders placed.

## 2021-02-08 NOTE — Progress Notes (Signed)
Pt arrived from [facility] via bed and accompanied by RN. Pt A/Ox4. Pt ambulated from bed to room, gait slow and steady. Assessment completed. Skin noted with surgical incisions intact with surgical glue: midline abdomen, bilateral groin sites. No bruising or induration noted, slight edema noted to groin sites. Pulses palpable to bilateral lower extremities. Pt denies pain; NAD. Respiration even and unlabored on RA.   Cell phone with wired headphones and charger noted. Pt has duffle bag with clothing and home meds. Pt educated on not taking home while in the hospital.   Pt oriented to unit and staff. Opportunity for question and concerns given. Call light with reach, bed in low position, and safety measures intact.

## 2021-02-08 NOTE — Progress Notes (Signed)
Mobility Specialist: Progress Note   02/08/21 1414  Mobility  Activity Ambulated in hall  Level of Assistance Modified independent, requires aide device or extra time  Assistive Device Front wheel walker  Distance Ambulated (ft) 420 ft  Mobility Ambulated with assistance in hallway  Mobility Response Tolerated well  Mobility performed by Mobility specialist  $Mobility charge 1 Mobility   Pre-Mobility: 118 HR, 142/105 BP, 96% SpO2 During Mobility: 132 HR Post-Mobility: 117 HR, 134/96 BP, 99% SpO2  Pt mod independent with bed mobility as well as to stand. Pt c/o 3/10 pain during ambulation but said his pain was minimal after returning to the bed, otherwise asx. Pt back to bed with call bell and phone at his side. Family member present in the room.   [facility] Eliannah Hinde Mobility Specialist Mobility Specialist Phone: [phone]

## 2021-02-09 ENCOUNTER — Encounter (HOSPITAL_COMMUNITY): Payer: Self-pay | Admitting: Vascular Surgery

## 2021-02-09 LAB — BASIC METABOLIC PANEL
Anion gap: 12 (ref 5–15)
BUN: 8 mg/dL (ref 6–20)
CO2: 20 mmol/L — ABNORMAL LOW (ref 22–32)
Calcium: 8.2 mg/dL — ABNORMAL LOW (ref 8.9–10.3)
Chloride: 107 mmol/L (ref 98–111)
Creatinine, Ser: 0.85 mg/dL (ref 0.61–1.24)
GFR, Estimated: >60 mL/min (ref >60)
Glucose, Bld: 98 mg/dL (ref 70–99)
Potassium: 3.3 mmol/L — ABNORMAL LOW (ref 3.5–5.1)
Sodium: 139 mmol/L (ref 135–145)

## 2021-02-09 LAB — CBC
HCT: 36.7 % — ABNORMAL LOW (ref 39.0–52.0)
Hemoglobin: 12.2 g/dL — ABNORMAL LOW (ref 13.0–17.0)
MCH: 33.2 pg (ref 26.0–34.0)
MCHC: 33.2 g/dL (ref 30.0–36.0)
MCV: 100 fL (ref 80.0–100.0)
Platelets: 558 10*3/uL — ABNORMAL HIGH (ref 150–400)
RBC: 3.67 MIL/uL — ABNORMAL LOW (ref 4.22–5.81)
RDW: 13.8 % (ref 11.5–15.5)
WBC: 32.9 10*3/uL — ABNORMAL HIGH (ref 4.0–10.5)
nRBC: 0 % (ref 0.0–0.2)

## 2021-02-09 LAB — MAGNESIUM: Magnesium: 2 mg/dL (ref 1.7–2.4)

## 2021-02-09 LAB — PHOSPHORUS: Phosphorus: 2.7 mg/dL (ref 2.5–4.6)

## 2021-02-09 MED ORDER — HEPARIN SODIUM (PORCINE) 5000 UNIT/ML IJ SOLN
5000.0000 [IU] | Freq: Three times a day (TID) | INTRAMUSCULAR | Status: DC
  Administered 2021-02-09 – 2021-02-16 (×21): 5000 [IU] via SUBCUTANEOUS
  Filled 2021-02-09 (×22): qty 1

## 2021-02-09 NOTE — Progress Notes (Signed)
Patient suddenly had a large amount of yellow colored emesis while trying to get OOB to [place] the bathroom. No sign of distress noted. Patient denied nausea despite vomiting.Bath given, call bell within reach will continue to monitor.

## 2021-02-09 NOTE — Progress Notes (Addendum)
   VASCULAR SURGERY ASSESSMENT & PLAN:   POD 3 AORTOFEMORAL BYPASS GRAFT: The patient is doing well with palpable pedal pulses.  We have started him on some clear liquids yesterday but he had a small emesis last night.  He does have bowel sounds but has not passed flatus or had a BM.  Keep n.p.o. for now.  We will give him a Dulcolax.  Hopefully can start diet soon.  DVT PROPHYLAXIS: I have ordered subcu heparin.  VASCULAR QUALITY INITIATIVE: Start aspirin and a statin when he is taking p.o.  LEUKOCYTOSIS: His white blood cell count is 32,000.  His abdomen is nontender and his incisions look fine.  He is afebrile.  His chest x-ray 2 days ago looked fine.  I will order a follow-up CBC tomorrow.   SUBJECTIVE:   No flatus.  No BM.  Had a little bit of nausea last night.  PHYSICAL EXAM:   Vitals:   02/08/21 2149 02/09/21 0000 02/09/21 0151 02/09/21 0415  BP: (!) 140/97 (!) 162/99 134/83 (!) 125/95  Pulse: 99 90 99 100  Resp: 20 (!) 27 (!) 28 (!) 24  Temp: 98.8 F (37.1 C)  98.3 F (36.8 C)   TempSrc: Oral  Oral   SpO2: 95% 95% 95% 96%  Weight:      Height:       His incisions look fine. Lungs clear. Palpable pedal pulses.  LABS:   Lab Results  Component Value Date   WBC 32.9 (H) 02/09/2021   HGB 12.2 (L) 02/09/2021   HCT 36.7 (L) 02/09/2021   MCV 100.0 02/09/2021   PLT 558 (H) 02/09/2021   Lab Results  Component Value Date   CREATININE 0.85 02/09/2021   Lab Results  Component Value Date   INR 1.1 01/20/2021   CBG (last 3)  Recent Labs    02/08/21 0026  GLUCAP 116*    PROBLEM LIST:    Active Problems:   Aortoiliac occlusive disease (HCC)   CURRENT MEDS:    acetaminophen  650 mg Oral Q6H   chlorhexidine  15 mL Mouth Rinse BID   Chlorhexidine Gluconate Cloth  6 each Topical Daily   docusate sodium  100 mg Oral Daily   folic acid  1 mg Oral Daily   mouth rinse  15 mL Mouth Rinse q12n4p   multivitamin with minerals  1 tablet Oral Daily   pantoprazole  (PROTONIX) IV  40 mg Intravenous Q24H   sodium chloride flush  10-40 mL Intracatheter Q12H   thiamine  100 mg Oral Daily   Or   thiamine  100 mg Intravenous Daily    Merlin Wheaton Office: [phone] 02/09/2021

## 2021-02-09 NOTE — Progress Notes (Signed)
Mobility Specialist: Progress Note   02/09/21 1705  Mobility  Activity Ambulated in hall  Level of Assistance Modified independent, requires aide device or extra time  Assistive Device Front wheel walker  Distance Ambulated (ft) 714 ft  Mobility Ambulated with assistance in hallway  Mobility Response Tolerated well  Mobility performed by Mobility specialist  $Mobility charge 1 Mobility   Pt in [place] upon entering room and agreeable to ambulate once finished, BM unsuccessful. Pt c/o feeling a little SOB towards end of ambulation, otherwise asx. Pt back to bed after walk with call bell at his side.   [facility] Abigayle Wilinski Mobility Specialist Mobility Specialist Phone: [phone]

## 2021-02-10 LAB — BPAM RBC
Blood Product Expiration Date: 202209302359
Blood Product Expiration Date: 202209302359
Blood Product Expiration Date: 202209302359
Blood Product Expiration Date: 202209302359
ISSUE DATE / TIME: 202209010731
ISSUE DATE / TIME: 202209010731
ISSUE DATE / TIME: 202209010731
ISSUE DATE / TIME: 202209010731
Unit Type and Rh: 5100
Unit Type and Rh: 5100
Unit Type and Rh: 5100
Unit Type and Rh: 5100

## 2021-02-10 LAB — TYPE AND SCREEN
ABO/RH(D): O POS
Antibody Screen: NEGATIVE
Unit division: 0
Unit division: 0
Unit division: 0
Unit division: 0

## 2021-02-10 LAB — CBC
HCT: 35.1 % — ABNORMAL LOW (ref 39.0–52.0)
Hemoglobin: 11.6 g/dL — ABNORMAL LOW (ref 13.0–17.0)
MCH: 32.6 pg (ref 26.0–34.0)
MCHC: 33 g/dL (ref 30.0–36.0)
MCV: 98.6 fL (ref 80.0–100.0)
Platelets: 632 10*3/uL — ABNORMAL HIGH (ref 150–400)
RBC: 3.56 MIL/uL — ABNORMAL LOW (ref 4.22–5.81)
RDW: 13.7 % (ref 11.5–15.5)
WBC: 32.8 10*3/uL — ABNORMAL HIGH (ref 4.0–10.5)
nRBC: 0 % (ref 0.0–0.2)

## 2021-02-10 LAB — BASIC METABOLIC PANEL
Anion gap: 10 (ref 5–15)
BUN: 12 mg/dL (ref 6–20)
CO2: 21 mmol/L — ABNORMAL LOW (ref 22–32)
Calcium: 8.2 mg/dL — ABNORMAL LOW (ref 8.9–10.3)
Chloride: 109 mmol/L (ref 98–111)
Creatinine, Ser: 0.8 mg/dL (ref 0.61–1.24)
GFR, Estimated: >60 mL/min (ref >60)
Glucose, Bld: 99 mg/dL (ref 70–99)
Potassium: 3.5 mmol/L (ref 3.5–5.1)
Sodium: 140 mmol/L (ref 135–145)

## 2021-02-10 MED ORDER — HYDROCHLOROTHIAZIDE 12.5 MG PO CAPS
12.5000 mg | ORAL_CAPSULE | Freq: Every day | ORAL | Status: DC
  Administered 2021-02-10 – 2021-02-11 (×2): 12.5 mg via ORAL
  Filled 2021-02-10 (×2): qty 1

## 2021-02-10 MED ORDER — GABAPENTIN 300 MG PO CAPS: 300.0000 mg | ORAL_CAPSULE | Freq: Every day | ORAL | Status: DC

## 2021-02-10 MED ORDER — GABAPENTIN 300 MG PO CAPS
600.0000 mg | ORAL_CAPSULE | Freq: Every evening | ORAL | Status: DC
  Administered 2021-02-10: 600 mg via ORAL
  Filled 2021-02-10: qty 2

## 2021-02-10 MED ORDER — OXYCODONE-ACETAMINOPHEN 5-325 MG PO TABS: 1.0000 | ORAL_TABLET | ORAL | Status: DC | PRN

## 2021-02-10 MED ORDER — ROSUVASTATIN CALCIUM 20 MG PO TABS
20.0000 mg | ORAL_TABLET | Freq: Every day | ORAL | Status: DC
  Administered 2021-02-10 – 2021-02-11 (×2): 20 mg via ORAL
  Filled 2021-02-10 (×2): qty 1

## 2021-02-10 MED ORDER — GABAPENTIN 300 MG PO CAPS: 300.0000 mg | ORAL_CAPSULE | ORAL | Status: DC

## 2021-02-10 MED ORDER — ASPIRIN EC 81 MG PO TBEC
81.0000 mg | DELAYED_RELEASE_TABLET | Freq: Every day | ORAL | Status: DC
  Administered 2021-02-10 – 2021-02-11 (×2): 81 mg via ORAL
  Filled 2021-02-10 (×2): qty 1

## 2021-02-10 MED ORDER — VARENICLINE TARTRATE 1 MG PO TABS
1.0000 mg | ORAL_TABLET | Freq: Two times a day (BID) | ORAL | Status: DC
  Administered 2021-02-10 – 2021-02-11 (×2): 1 mg via ORAL
  Filled 2021-02-10 (×14): qty 1

## 2021-02-10 NOTE — Progress Notes (Signed)
   VASCULAR SURGERY ASSESSMENT & PLAN:   POD 4 AORTOFEMORAL BYPASS GRAFT: The patient is doing well with palpable pedal pulses. He has moved his bowels and is passing flatus. I will advance him to full liquids but encouraged him to go slow as his abdomen is still slightly distended.   DVT PROPHYLAXIS: He is on subcu heparin.   VASCULAR QUALITY INITIATIVE: Start aspirin and a statin  now that he is taking p.o.  LEUKOCYTOSIS: His white blood cell count is 32,000.  He tells me that he has a chronic leukocytosis and this is not something new.   SUBJECTIVE:   No complaints. Had a BM and is passing flatus  PHYSICAL EXAM:   Vitals:   02/10/21 0400 02/10/21 0412 02/10/21 0459 02/10/21 0810  BP:  (!) 164/97 (!) 147/99 (!) 163/97  Pulse:  92 92 81  Resp: '20 20 20 20  '$ Temp:  (!) 97.5 F (36.4 C)  98.7 F (37.1 C)  TempSrc:  Oral  Oral  SpO2:  94% 95% 97%  Weight:      Height:       His incisions look fine. He has normal pitched bowel sounds.  His abdomen is slightly distended. He has palpable pedal pulses.  LABS:   Lab Results  Component Value Date   WBC 32.8 (H) 02/10/2021   HGB 11.6 (L) 02/10/2021   HCT 35.1 (L) 02/10/2021   MCV 98.6 02/10/2021   PLT 632 (H) 02/10/2021   Lab Results  Component Value Date   CREATININE 0.80 02/10/2021   Lab Results  Component Value Date   INR 1.1 01/20/2021   CBG (last 3)  Recent Labs    02/08/21 0026  GLUCAP 116*    PROBLEM LIST:    Active Problems:   Aortoiliac occlusive disease (HCC)   CURRENT MEDS:    acetaminophen  650 mg Oral Q6H   chlorhexidine  15 mL Mouth Rinse BID   Chlorhexidine Gluconate Cloth  6 each Topical Daily   docusate sodium  100 mg Oral Daily   folic acid  1 mg Oral Daily   heparin  5,000 Units Subcutaneous Q8H   mouth rinse  15 mL Mouth Rinse q12n4p   multivitamin with minerals  1 tablet Oral Daily   pantoprazole (PROTONIX) IV  40 mg Intravenous Q24H   sodium chloride flush  10-40 mL  Intracatheter Q12H   thiamine  100 mg Oral Daily   Or   thiamine  100 mg Intravenous Daily    TRUE Cowdin Office: [phone] 02/10/2021

## 2021-02-10 NOTE — TOC Initial Note (Signed)
Transition of Care [facility]) - Initial/Assessment Note    Patient Details  Name: Brandon Lara MRN: [id number] Date of Birth: [birth year]  Transition of Care [facility]) CM/SW Contact:    Bethann Berkshire, [place] Phone Number: 02/10/2021, 11:01 AM  Clinical Narrative:                  CSW met with pt regarding substance use consult. Pt reports drinking a 6 pack of beer daily. He reports he has been doing so for "quite a while" indicating he has been doing so for years. He reports he has never reduced or quit alcohol use but since his recent health issues he is interested in quitting. He reports no concerns regarding his ability to quit alcohol use. He is open to discuss tx available. CSW explained outpatient tx options including Intensive Outpatient and regular outpatient. Pt is agreeable to receiving list of resources. He has no other questions for CSW at this time.   Expected Discharge Plan: [place] Barriers to Discharge: Continued Medical Work up   Patient Goals and CMS Choice        Expected Discharge Plan and Services Expected Discharge Plan: [place]       Living arrangements for the past 2 months: Single Family Home                                      Prior Living Arrangements/Services Living arrangements for the past 2 months: Single Family Home Lives with:: Self                   Activities of Daily Living Home Assistive Devices/Equipment: Eyeglasses ADL Screening (condition at time of admission) Patient's cognitive ability adequate to safely complete daily activities?: Yes Is the patient deaf or have difficulty hearing?: No Does the patient have difficulty seeing, even when wearing glasses/contacts?: No Does the patient have difficulty concentrating, remembering, or making decisions?: No Patient able to express need for assistance with ADLs?: Yes Does the patient have difficulty dressing or bathing?: No Independently  performs ADLs?: Yes (appropriate for developmental age) Does the patient have difficulty walking or climbing stairs?: No Weakness of Legs: None Weakness of Arms/Hands: None  Permission Sought/Granted                  Emotional Assessment Appearance:: Appears stated age Attitude/Demeanor/Rapport: Engaged Affect (typically observed): Accepting   Alcohol / Substance Use: Alcohol Use Psych Involvement: No (comment)  Admission diagnosis:  Aortoiliac occlusive disease ([place]) [I74.09] Patient Active Problem List   Diagnosis Date Noted   Aortoiliac occlusive disease ([place]) 02/06/2021   Hyperlipidemia 01/29/2021   Hypertension 01/22/2021   Leukocytosis 01/22/2021   Thrombocytosis 01/22/2021   Health care maintenance 01/22/2021   COPD (chronic obstructive pulmonary disease) ([place]) 01/22/2021   Atherosclerosis of aorta ([place]) 01/22/2021   PAD (peripheral artery disease) ([place]) 01/09/2021   Tobacco use disorder, severe, in early remission 01/09/2021   PCP:  [name], MD Pharmacy:   [facility], [place] [address] Phone: [phone] Fax: [phone]     Social Determinants of Health (SDOH) Interventions    Readmission Risk Interventions No flowsheet data found.

## 2021-02-10 NOTE — Progress Notes (Signed)
Physical Therapy Treatment Patient Details Name: Brandon Lara MRN: [id number] DOB: 1960/06/15 Today's Date: 02/10/2021    History of Present Illness 60 yo male presenting for aortobifemoral bypass on 9/1 due to PAD and RLE ischemia. PMH includes: PAD, tobacco and alcohol use, COPD, and HTN.   PT Comments    The pt was agreeable to session and continues to demo god progress in activity tolerance and stability as he was able to complete a good bout of hallway ambulation without use of AD at this time. The pt was also able to complete stair training with use of single rail and minA. Will continue to benefit from skilled PT acutely to progress activity tolerance and facilitate return to full prior level of independence.     Follow Up Recommendations  Home health PT;Supervision for mobility/OOB     Equipment Recommendations  None recommended by PT    Recommendations for Other Services       Precautions / Restrictions Precautions Precautions: Fall Precaution Comments: low fall Restrictions Weight Bearing Restrictions: No    Mobility  Bed Mobility Overal bed mobility: Modified Independent             General bed mobility comments: increased time    Transfers Overall transfer level: Modified independent Equipment used: None Transfers: Sit to/from Stand           General transfer comment: pt able to complete without UE support or assist  Ambulation/Gait Ambulation/Gait assistance: Min guard Gait Distance (Feet): 250 Feet Assistive device: None Gait Pattern/deviations: Step-through pattern;Decreased stride length;Trunk flexed Gait velocity: slightly decreased   General Gait Details: pt with slight trunk flexion, no overt LOB, intermittently seeking single UE support but was able to manage with no UE support for entire gait   Stairs Stairs: Yes Stairs assistance: Min assist Stair Management: One rail Right;No rails;Step to pattern;Forwards Number of Stairs:  2 (x3) General stair comments: pt with difficulty without use of at least single rail for support. HR to 138bpm       Balance Overall balance assessment: Mild deficits observed, not formally tested                                          Cognition Arousal/Alertness: Awake/alert Behavior During Therapy: WFL for tasks assessed/performed Overall Cognitive Status: Within Functional Limits for tasks assessed                                           General Comments General comments (skin integrity, edema, etc.): HR to 138 bpm after stairs, returned to 120s with standing rest      Pertinent Vitals/Pain Pain Assessment: Faces Faces Pain Scale: Hurts a little bit Pain Location: low abdomen Pain Descriptors / Indicators: Sore Pain Intervention(s): Limited activity within patient's tolerance;Monitored during session;Repositioned     PT Goals (current goals can now be found in the care plan section) Acute Rehab PT Goals Patient Stated Goal: Return home PT Goal Formulation: With patient Time For Goal Achievement: 02/21/21 Potential to Achieve Goals: Good Progress towards PT goals: Progressing toward goals    Frequency    Min 3X/week      PT Plan Current plan remains appropriate       AM-PAC PT "6 Clicks" Mobility   Outcome Measure  Help needed turning from your back to your side while in a flat bed without using bedrails?: A Little Help needed moving from lying on your back to sitting on the side of a flat bed without using bedrails?: A Little Help needed moving to and from a bed to a chair (including a wheelchair)?: A Little Help needed standing up from a chair using your arms (e.g., wheelchair or bedside chair)?: A Little Help needed to walk in hospital room?: A Little Help needed climbing 3-5 steps with a railing? : A Little 6 Click Score: 18    End of Session Equipment Utilized During Treatment: Gait belt Activity Tolerance:  Patient tolerated treatment well Patient left: in chair;with call bell/phone within reach;with family/visitor present Nurse Communication: Mobility status PT Visit Diagnosis: Other abnormalities of gait and mobility (R26.89)     Time: [id number] PT Time Calculation (min) (ACUTE ONLY): 15 min  Charges:  $Gait Training: 8-22 mins                     West Carbo, PT, DPT   Acute Rehabilitation Department Pager #: [phone]   Sandra Cockayne 02/10/2021, 3:12 PM

## 2021-02-11 ENCOUNTER — Inpatient Hospital Stay (HOSPITAL_COMMUNITY): Payer: BC Managed Care – PPO

## 2021-02-11 LAB — BASIC METABOLIC PANEL
Anion gap: 9 (ref 5–15)
BUN: 8 mg/dL (ref 6–20)
CO2: 26 mmol/L (ref 22–32)
Calcium: 8.3 mg/dL — ABNORMAL LOW (ref 8.9–10.3)
Chloride: 105 mmol/L (ref 98–111)
Creatinine, Ser: 0.71 mg/dL (ref 0.61–1.24)
GFR, Estimated: >60 mL/min (ref >60)
Glucose, Bld: 98 mg/dL (ref 70–99)
Potassium: 3.1 mmol/L — ABNORMAL LOW (ref 3.5–5.1)
Sodium: 140 mmol/L (ref 135–145)

## 2021-02-11 LAB — HEPATIC FUNCTION PANEL
ALT: 19 U/L (ref 0–44)
AST: 21 U/L (ref 15–41)
Albumin: 2.3 g/dL — ABNORMAL LOW (ref 3.5–5.0)
Alkaline Phosphatase: 59 U/L (ref 38–126)
Bilirubin, Direct: <0.1 mg/dL (ref 0.0–0.2)
Total Bilirubin: 1 mg/dL (ref 0.3–1.2)
Total Protein: 4.9 g/dL — ABNORMAL LOW (ref 6.5–8.1)

## 2021-02-11 LAB — CBC
HCT: 35.2 % — ABNORMAL LOW (ref 39.0–52.0)
Hemoglobin: 11.6 g/dL — ABNORMAL LOW (ref 13.0–17.0)
MCH: 32.6 pg (ref 26.0–34.0)
MCHC: 33 g/dL (ref 30.0–36.0)
MCV: 98.9 fL (ref 80.0–100.0)
Platelets: 726 10*3/uL — ABNORMAL HIGH (ref 150–400)
RBC: 3.56 MIL/uL — ABNORMAL LOW (ref 4.22–5.81)
RDW: 13.6 % (ref 11.5–15.5)
WBC: 25.6 10*3/uL — ABNORMAL HIGH (ref 4.0–10.5)
nRBC: 0 % (ref 0.0–0.2)

## 2021-02-11 MED ORDER — KCL IN DEXTROSE-NACL 20-5-0.45 MEQ/L-%-% IV SOLN
INTRAVENOUS | Status: DC
  Filled 2021-02-11 (×8): qty 1000

## 2021-02-11 NOTE — Progress Notes (Addendum)
Bottom of Midline incision heavily leaking when patient stood up. MD contacted and PA arrived at bedside. Pervina placed by PA. Will continue to monitor.  [place] N Texie Tupou, RN

## 2021-02-11 NOTE — Progress Notes (Addendum)
[facility] [place]  Subjective  - He vomited last night states he is afraid to eat.   Objective (!) 150/98 82 98.5 F (36.9 C) (Oral) 20 95%  Intake/Output Summary (Last 24 hours) at 02/11/2021 0716 Last data filed at 02/11/2021 [id number] Gross per 24 hour  Intake --  Output 950 ml  Net -950 ml    Abdomin distended with + BS and compressible Abdominal incision with ss drainage just above the umbilicus No dehiscence, groin healing well without hematoma Feet warm with palpable pedal pulses  Lungs non labored breathing  Assessment/Planning: POD #  5 s/p aortobifemoral bypass  He is afraid to eat after vomiting last night.  He has BS, he will go back to ice chips and go slow with intake.  Protonix given for heart burn.  Will observe. Dry dressing to abdomin PRN.  No dehiscence or signs of infection.  Patient has known Leukocytosis for years of unknown cause. Pending AM labs PlT have been elevated, HGB slowly dropping, but stable and asymptomatic.    Roxy Horseman 02/11/2021 7:16 AM --  Laboratory Lab Results: Recent Labs    02/09/21 0128 02/10/21 0030  WBC 32.9* 32.8*  HGB 12.2* 11.6*  HCT 36.7* 35.1*  PLT 558* 632*   BMET Recent Labs    02/09/21 0128 02/10/21 0030  NA 139 140  K 3.3* 3.5  CL 107 109  CO2 20* 21*  GLUCOSE 98 99  BUN 8 12  CREATININE 0.85 0.80  CALCIUM 8.2* 8.2*    COAG Lab Results  Component Value Date   INR 1.1 01/20/2021   No results found for: PTT  VASCULAR STAFF ADDENDUM: I have independently interviewed and examined the patient. I agree with the above.  Ileus POD#5 s/p Aortobifemoral bypass. Having some bowel function, but quite distended with hiccups and indigestion. Very low threshold for NGT placement.  OK for sips for comfort. Otherwise NPO. Restart IVF. Chronic leukocytosis. No other evidence of infection.   Yevonne Aline. Stanford Breed, MD [facility] Phone  Number: [phone] 02/11/2021 8:11 AM

## 2021-02-11 NOTE — Progress Notes (Signed)
Mobility Specialist Progress Note    02/11/21 1404  Mobility  Activity  (Cancelled)   Pt received supine in bed, receptive to mobility session. Pt emesis rapidly and RN was notified. Pt did agree to trying mobility session again later on this afternoon.  Holland Falling Mobility Specialist Phone Number [phone]

## 2021-02-11 NOTE — Progress Notes (Signed)
    Called to bedside secondary to SS drainage increase from abdominal incision in the area of the umbilicus.  No erythema or dehiscence of incision.  No sign of infection.Saturated bedding when he sat up and puddle on the floor when he stood.   Abdomin less distended, he states he feels better and now doesn't feel nauseous or indigestion.    Prevenna wound vac placed over drainage site of Abdominal incision.    Will draw liver panal for work up to r/o ascites.    Roxy Horseman  PA-C

## 2021-02-12 LAB — BASIC METABOLIC PANEL
Anion gap: 8 (ref 5–15)
BUN: 5 mg/dL — ABNORMAL LOW (ref 6–20)
CO2: 29 mmol/L (ref 22–32)
Calcium: 7.8 mg/dL — ABNORMAL LOW (ref 8.9–10.3)
Chloride: 99 mmol/L (ref 98–111)
Creatinine, Ser: 0.64 mg/dL (ref 0.61–1.24)
GFR, Estimated: >60 mL/min (ref >60)
Glucose, Bld: 107 mg/dL — ABNORMAL HIGH (ref 70–99)
Potassium: 2.9 mmol/L — ABNORMAL LOW (ref 3.5–5.1)
Sodium: 136 mmol/L (ref 135–145)

## 2021-02-12 LAB — CBC
HCT: 32.9 % — ABNORMAL LOW (ref 39.0–52.0)
Hemoglobin: 11.1 g/dL — ABNORMAL LOW (ref 13.0–17.0)
MCH: 32.3 pg (ref 26.0–34.0)
MCHC: 33.7 g/dL (ref 30.0–36.0)
MCV: 95.6 fL (ref 80.0–100.0)
Platelets: 724 10*3/uL — ABNORMAL HIGH (ref 150–400)
RBC: 3.44 MIL/uL — ABNORMAL LOW (ref 4.22–5.81)
RDW: 13.4 % (ref 11.5–15.5)
WBC: 25.6 10*3/uL — ABNORMAL HIGH (ref 4.0–10.5)
nRBC: 0 % (ref 0.0–0.2)

## 2021-02-12 MED ORDER — GABAPENTIN 250 MG/5ML PO SOLN
300.0000 mg | Freq: Every day | ORAL | Status: DC
  Filled 2021-02-12 (×2): qty 6

## 2021-02-12 MED ORDER — ROSUVASTATIN CALCIUM 20 MG PO TABS
20.0000 mg | ORAL_TABLET | Freq: Every day | ORAL | Status: DC
  Administered 2021-02-13: 20 mg
  Filled 2021-02-12: qty 1

## 2021-02-12 MED ORDER — GABAPENTIN 250 MG/5ML PO SOLN
600.0000 mg | Freq: Every day | ORAL | Status: DC
  Filled 2021-02-12: qty 12

## 2021-02-12 MED ORDER — ACETAMINOPHEN 325 MG PO TABS
650.0000 mg | ORAL_TABLET | Freq: Four times a day (QID) | ORAL | Status: DC
  Filled 2021-02-12 (×2): qty 2

## 2021-02-12 MED ORDER — ASPIRIN 81 MG PO CHEW
81.0000 mg | CHEWABLE_TABLET | Freq: Every day | ORAL | Status: DC
  Administered 2021-02-13: 81 mg
  Filled 2021-02-12: qty 1

## 2021-02-12 MED ORDER — POTASSIUM CHLORIDE 10 MEQ/100ML IV SOLN
10.0000 meq | INTRAVENOUS | Status: AC
  Administered 2021-02-12 (×4): 10 meq via INTRAVENOUS
  Filled 2021-02-12 (×4): qty 100

## 2021-02-12 MED ORDER — ADULT MULTIVITAMIN LIQUID CH
15.0000 mL | Freq: Every day | ORAL | Status: DC
  Administered 2021-02-13: 15 mL
  Filled 2021-02-12 (×2): qty 15

## 2021-02-12 MED ORDER — OXYCODONE-ACETAMINOPHEN 5-325 MG PO TABS: 1.0000 | ORAL_TABLET | ORAL | Status: DC | PRN

## 2021-02-12 MED ORDER — DOCUSATE SODIUM 50 MG/5ML PO LIQD
100.0000 mg | Freq: Every day | ORAL | Status: DC
  Administered 2021-02-13: 100 mg
  Filled 2021-02-12: qty 10

## 2021-02-12 MED ORDER — GUAIFENESIN-DM 100-10 MG/5ML PO SYRP: 15.0000 mL | ORAL_SOLUTION | ORAL | Status: DC | PRN

## 2021-02-12 MED ORDER — FOLIC ACID 1 MG PO TABS
1.0000 mg | ORAL_TABLET | Freq: Every day | ORAL | Status: DC
  Administered 2021-02-13: 1 mg
  Filled 2021-02-12: qty 1

## 2021-02-12 MED ORDER — HYDROCHLOROTHIAZIDE 10 MG/ML ORAL SUSPENSION
12.5000 mg | Freq: Every day | ORAL | Status: DC
  Administered 2021-02-13: 13 mg
  Filled 2021-02-12 (×2): qty 1.88

## 2021-02-12 MED ORDER — OXYCODONE HCL 5 MG PO TABS: 5.0000 mg | ORAL_TABLET | ORAL | Status: DC | PRN

## 2021-02-12 MED ORDER — ALUM & MAG HYDROXIDE-SIMETH 200-200-20 MG/5ML PO SUSP
15.0000 mL | ORAL | Status: DC | PRN
Start: 2021-02-12 — End: 2021-02-14

## 2021-02-12 NOTE — Progress Notes (Addendum)
  Progress Note    02/12/2021 7:47 AM 6 Days Post-Op  Subjective:  no longer nauseous since NG tube was replaced   Vitals:   02/12/21 0500 02/12/21 0741  BP:  129/86  Pulse:  78  Resp: 20 20  Temp:  98.7 F (37.1 C)  SpO2:  95%   Physical Exam: Lungs:  non labored Incisions:  abd incision with prevena in place, minimal drainage; groin incisions healing well Extremities:  palpable R PT and L DP Abdomen:  soft, ND, NT Neurologic: A&O  CBC    Component Value Date/Time   WBC 25.6 (H) 02/11/2021 0734   RBC 3.56 (L) 02/11/2021 0734   HGB 11.6 (L) 02/11/2021 0734   HGB 17.3 01/21/2021 1617   HGB 16.9 01/21/2021 1617   HCT 35.2 (L) 02/11/2021 0734   HCT 49.4 01/21/2021 1617   HCT 48.8 01/21/2021 1617   PLT 726 (H) 02/11/2021 0734   PLT 644 (H) 01/21/2021 1617   PLT 647 (H) 01/21/2021 1617   MCV 98.9 02/11/2021 0734   MCV 93 01/21/2021 1617   MCV 94 01/21/2021 1617   MCH 32.6 02/11/2021 0734   MCHC 33.0 02/11/2021 0734   RDW 13.6 02/11/2021 0734   RDW 13.6 01/21/2021 1617   RDW 13.9 01/21/2021 1617   LYMPHSABS 4.2 (H) 01/21/2021 1617   LYMPHSABS 4.0 (H) 01/21/2021 1617   EOSABS 0.4 01/21/2021 1617   EOSABS 0.3 01/21/2021 1617   BASOSABS 0.2 01/21/2021 1617   BASOSABS 0.3 (H) 01/21/2021 1617    BMET    Component Value Date/Time   NA 140 02/11/2021 0734   NA 136 01/28/2021 1045   K 3.1 (L) 02/11/2021 0734   CL 105 02/11/2021 0734   CO2 26 02/11/2021 0734   GLUCOSE 98 02/11/2021 0734   BUN 8 02/11/2021 0734   BUN 10 01/28/2021 1045   CREATININE 0.71 02/11/2021 0734   CALCIUM 8.3 (L) 02/11/2021 0734   GFRNONAA >60 02/11/2021 0734    INR    Component Value Date/Time   INR 1.1 01/20/2021 1514     Intake/Output Summary (Last 24 hours) at 02/12/2021 0747 Last data filed at 02/12/2021 0529 Gross per 24 hour  Intake 487.56 ml  Output 1500 ml  Net -1012.44 ml     Assessment/Plan:  60 y.o. male is s/p ABF bypass with post operative ileus 6 Days Post-Op    BLE well perfused with palpable pedal pulses GI: NG replaced yesterday; N/V resolved, subjectively less distended; continue NPO until bowel function returns; continue IVF; wean narcotics if possible Abd incision now with prevena in place due to drainage, continue at least until time of discharge home Continue OOB and participation with therapy Home when tolerating a regular diet      Dagoberto Ligas, PA-C [facility] [phone] 02/12/2021 7:47 AM  VASCULAR STAFF ADDENDUM: I have independently interviewed and examined the patient. I agree with the above.  Doing well POD#6 aortobifemoral bypass. Ileus may be resolving. Passing some gas. Keep NGT to suction today to rest the gut. Continue prevena to midline for now. Minimal drainage in canister. PT / OT / OOB / Ambulate.  Yevonne Aline. Stanford Breed, MD [facility] Phone Number: [phone] 02/12/2021 8:25 AM

## 2021-02-12 NOTE — Progress Notes (Signed)
Patient potasium 2.9 this AM, Dagoberto Ligas [facility] made aware and orders received. Daylin Gruszka, Bettina Gavia RN

## 2021-02-12 NOTE — Progress Notes (Signed)
Mobility Specialist Progress Note   02/12/21 1426  Mobility  Activity Ambulated in hall  Level of Assistance Modified independent, requires aide device or extra time  Assistive Device Front wheel walker  Distance Ambulated (ft) 490 ft  Mobility Ambulated with assistance in hallway  Mobility Response Tolerated well  Mobility performed by Mobility specialist  Bed Position Chair  $Mobility charge 1 Mobility   Received pt supine in bed. Pt agreeable to mobility session. C/o of soreness from NG tube, otherwise asx. Pt to recliner after walk w/ mom in the room.   Pre Mobility: 74HR, 146/93 BP, 100SpO2 Post Mobility: 70HR, 129/91 BP, SpO2  Holland Falling Mobility Specialist Phone Number [phone]

## 2021-02-13 LAB — CBC
HCT: 33.7 % — ABNORMAL LOW (ref 39.0–52.0)
Hemoglobin: 11.5 g/dL — ABNORMAL LOW (ref 13.0–17.0)
MCH: 32.9 pg (ref 26.0–34.0)
MCHC: 34.1 g/dL (ref 30.0–36.0)
MCV: 96.3 fL (ref 80.0–100.0)
Platelets: 712 10*3/uL — ABNORMAL HIGH (ref 150–400)
RBC: 3.5 MIL/uL — ABNORMAL LOW (ref 4.22–5.81)
RDW: 13.2 % (ref 11.5–15.5)
WBC: 29.1 10*3/uL — ABNORMAL HIGH (ref 4.0–10.5)
nRBC: 0 % (ref 0.0–0.2)

## 2021-02-13 LAB — BASIC METABOLIC PANEL
Anion gap: 8 (ref 5–15)
BUN: <5 mg/dL — ABNORMAL LOW (ref 6–20)
CO2: 27 mmol/L (ref 22–32)
Calcium: 7.9 mg/dL — ABNORMAL LOW (ref 8.9–10.3)
Chloride: 97 mmol/L — ABNORMAL LOW (ref 98–111)
Creatinine, Ser: 0.61 mg/dL (ref 0.61–1.24)
GFR, Estimated: >60 mL/min (ref >60)
Glucose, Bld: 101 mg/dL — ABNORMAL HIGH (ref 70–99)
Potassium: 3.4 mmol/L — ABNORMAL LOW (ref 3.5–5.1)
Sodium: 132 mmol/L — ABNORMAL LOW (ref 135–145)

## 2021-02-13 NOTE — Progress Notes (Signed)
NGT clamped per order.

## 2021-02-13 NOTE — Progress Notes (Addendum)
  Progress Note    02/13/2021 7:42 AM 7 Days Post-Op  Subjective:  no N/V.  BM and passing flatus last night   Vitals:   02/13/21 0300 02/13/21 0342  BP:  (!) 144/90  Pulse:  84  Resp: 20 18  Temp:  98.1 F (36.7 C)  SpO2:  98%   Physical Exam: Lungs:  non labored Incisions:  groin incisions healing well; abd incision with prevena in place, good seal Extremities:  palpable DP pulses Abdomen:  soft, NT, ND Neurologic: A&O  CBC    Component Value Date/Time   WBC 29.1 (H) 02/13/2021 0114   RBC 3.50 (L) 02/13/2021 0114   HGB 11.5 (L) 02/13/2021 0114   HGB 17.3 01/21/2021 1617   HGB 16.9 01/21/2021 1617   HCT 33.7 (L) 02/13/2021 0114   HCT 49.4 01/21/2021 1617   HCT 48.8 01/21/2021 1617   PLT 712 (H) 02/13/2021 0114   PLT 644 (H) 01/21/2021 1617   PLT 647 (H) 01/21/2021 1617   MCV 96.3 02/13/2021 0114   MCV 93 01/21/2021 1617   MCV 94 01/21/2021 1617   MCH 32.9 02/13/2021 0114   MCHC 34.1 02/13/2021 0114   RDW 13.2 02/13/2021 0114   RDW 13.6 01/21/2021 1617   RDW 13.9 01/21/2021 1617   LYMPHSABS 4.2 (H) 01/21/2021 1617   LYMPHSABS 4.0 (H) 01/21/2021 1617   EOSABS 0.4 01/21/2021 1617   EOSABS 0.3 01/21/2021 1617   BASOSABS 0.2 01/21/2021 1617   BASOSABS 0.3 (H) 01/21/2021 1617    BMET    Component Value Date/Time   NA 132 (L) 02/13/2021 0114   NA 136 01/28/2021 1045   K 3.4 (L) 02/13/2021 0114   CL 97 (L) 02/13/2021 0114   CO2 27 02/13/2021 0114   GLUCOSE 101 (H) 02/13/2021 0114   BUN <5 (L) 02/13/2021 0114   BUN 10 01/28/2021 1045   CREATININE 0.61 02/13/2021 0114   CALCIUM 7.9 (L) 02/13/2021 0114   GFRNONAA >60 02/13/2021 0114    INR    Component Value Date/Time   INR 1.1 01/20/2021 1514     Intake/Output Summary (Last 24 hours) at 02/13/2021 0742 Last data filed at 02/13/2021 0656 Gross per 24 hour  Intake 3184.05 ml  Output 1540 ml  Net 1644.05 ml     Assessment/Plan:  60 y.o. male is s/p ABF bypass 7 Days Post-Op   BLE well perfused  with palpable pulses GI: BM last night, flatus this morning, wean narcotics, clamp NG this morning with plan to remove it this evening if he tolerates this well OOB with RN/PT/OT Continue prevena for now; will need to remove prior to discharge to evaluate Home when tolerating regular diet   Dagoberto Ligas, PA-C [facility] [phone] 02/13/2021 7:42 AM  VASCULAR STAFF ADDENDUM: I have independently interviewed and examined the patient. I agree with the above.  NGT output down. Passing flatus. Moving bowels. Mildly distended.  NGT clamp trial. Return to suction if any N/V. Will check residual in AM before pulling NGT.   Yevonne Aline. Stanford Breed, MD [facility] of [facility] Phone Number: [phone] 02/13/2021 1:00 PM

## 2021-02-13 NOTE — Progress Notes (Signed)
Mobility Specialist: Progress Note   02/13/21 1820  Mobility  Activity Ambulated in hall  Level of Assistance Modified independent, requires aide device or extra time  Assistive Device Front wheel walker  Distance Ambulated (ft) 470 ft  Mobility Ambulated with assistance in hallway  Mobility Response Tolerated well  Mobility performed by Mobility specialist  $Mobility charge 1 Mobility   During Mobility: 111 HR Post-Mobility: 96 HR, 136/80 BP, 100% SpO2  Pt independent to stand from chair and mod independent during ambulation, asx throughout. Pt back to bed after walk with call bell at his side.   [facility] Kazi Montoro Mobility Specialist Mobility Specialist Phone: [phone]

## 2021-02-13 NOTE — Progress Notes (Signed)
Physical Therapy Treatment Patient Details Name: Brandon Lara MRN: [id number] DOB: 01-Mar-1961 Today's Date: 02/13/2021    History of Present Illness 60 yo male presenting for aortobifemoral bypass on 9/1 due to PAD and RLE ischemia. PMH includes: PAD, tobacco and alcohol use, COPD, and HTN.    PT Comments    The pt was eager to continue mobilizing and progression of activity this session. He does present with increased balance deficits, requiring at least single UE support on IV pole and minA. The pt was still able to complete multiple bouts of hallway ambulation with VSS (HR to 126 bpm, SpO2 95% on RA with good pleth). Will benefit from continued efforts towards improved dynamic stability and independence with mobility.     Follow Up Recommendations  Home health PT;Supervision for mobility/OOB     Equipment Recommendations  None recommended by PT    Recommendations for Other Services       Precautions / Restrictions Precautions Precautions: Fall Precaution Comments: pt wears glasses for all mobility at baseline Restrictions Weight Bearing Restrictions: No    Mobility  Bed Mobility Overal bed mobility: Modified Independent             General bed mobility comments: impulsively quick to EOB despite lines    Transfers Overall transfer level: Modified independent Equipment used: None Transfers: Sit to/from Stand Sit to Stand: Min guard         General transfer comment: pt able to complete without UE support or assist  Ambulation/Gait Ambulation/Gait assistance: Min assist Gait Distance (Feet): 250 Feet Assistive device: IV Pole Gait Pattern/deviations: Step-through pattern;Decreased stride length;Trunk flexed;Staggering right Gait velocity: decreased   General Gait Details: pt with x1 instance of staggering steps to R needing minA to recover. drifting R through entire ambulation needing minA and single UE support on IV pole       Balance Overall  balance assessment: Mild deficits observed, not formally tested                                          Cognition Arousal/Alertness: Awake/alert Behavior During Therapy: WFL for tasks assessed/performed Overall Cognitive Status: Within Functional Limits for tasks assessed                                 General Comments: pt with slightly decreased awareness, stating he just remembered for the first time this session that he wears glasses for mobility         General Comments General comments (skin integrity, edema, etc.): VSS on RA< pt with improved mobility with glasses      Pertinent Vitals/Pain Pain Assessment: No/denies pain Pain Intervention(s): Monitored during session     PT Goals (current goals can now be found in the care plan section) Acute Rehab PT Goals Patient Stated Goal: Return home PT Goal Formulation: With patient Time For Goal Achievement: 02/21/21 Potential to Achieve Goals: Good Progress towards PT goals: Progressing toward goals    Frequency    Min 3X/week      PT Plan Current plan remains appropriate       AM-PAC PT "6 Clicks" Mobility   Outcome Measure  Help needed turning from your back to your side while in a flat bed without using bedrails?: A Little Help needed moving from lying on your back  to sitting on the side of a flat bed without using bedrails?: A Little Help needed moving to and from a bed to a chair (including a wheelchair)?: A Little Help needed standing up from a chair using your arms (e.g., wheelchair or bedside chair)?: A Little Help needed to walk in hospital room?: A Little Help needed climbing 3-5 steps with a railing? : A Little 6 Click Score: 18    End of Session Equipment Utilized During Treatment: Gait belt Activity Tolerance: Patient tolerated treatment well Patient left: in chair;with call bell/phone within reach Nurse Communication: Mobility status PT Visit Diagnosis: Other  abnormalities of gait and mobility (R26.89)     Time: [id number] PT Time Calculation (min) (ACUTE ONLY): 25 min  Charges:  $Gait Training: 23-37 mins                     West Carbo, PT, DPT   Acute Rehabilitation Department Pager #: [phone]   Sandra Cockayne 02/13/2021, 5:19 PM

## 2021-02-14 LAB — CBC
HCT: 36.7 % — ABNORMAL LOW (ref 39.0–52.0)
Hemoglobin: 12.3 g/dL — ABNORMAL LOW (ref 13.0–17.0)
MCH: 32.5 pg (ref 26.0–34.0)
MCHC: 33.5 g/dL (ref 30.0–36.0)
MCV: 96.8 fL (ref 80.0–100.0)
Platelets: 819 10*3/uL — ABNORMAL HIGH (ref 150–400)
RBC: 3.79 MIL/uL — ABNORMAL LOW (ref 4.22–5.81)
RDW: 13.2 % (ref 11.5–15.5)
WBC: 32.2 10*3/uL — ABNORMAL HIGH (ref 4.0–10.5)
nRBC: 0 % (ref 0.0–0.2)

## 2021-02-14 LAB — BASIC METABOLIC PANEL
Anion gap: 11 (ref 5–15)
BUN: <5 mg/dL — ABNORMAL LOW (ref 6–20)
CO2: 30 mmol/L (ref 22–32)
Calcium: 8.4 mg/dL — ABNORMAL LOW (ref 8.9–10.3)
Chloride: 93 mmol/L — ABNORMAL LOW (ref 98–111)
Creatinine, Ser: 0.7 mg/dL (ref 0.61–1.24)
GFR, Estimated: >60 mL/min (ref >60)
Glucose, Bld: 95 mg/dL (ref 70–99)
Potassium: 3 mmol/L — ABNORMAL LOW (ref 3.5–5.1)
Sodium: 134 mmol/L — ABNORMAL LOW (ref 135–145)

## 2021-02-14 MED ORDER — FOLIC ACID 1 MG PO TABS
1.0000 mg | ORAL_TABLET | Freq: Every day | ORAL | Status: DC
  Administered 2021-02-14: 1 mg via ORAL
  Filled 2021-02-14 (×2): qty 1

## 2021-02-14 MED ORDER — ASPIRIN 81 MG PO CHEW
81.0000 mg | CHEWABLE_TABLET | Freq: Every day | ORAL | Status: DC
  Administered 2021-02-14 – 2021-02-16 (×3): 81 mg via ORAL
  Filled 2021-02-14 (×3): qty 1

## 2021-02-14 MED ORDER — ALUM & MAG HYDROXIDE-SIMETH 200-200-20 MG/5ML PO SUSP: 15.0000 mL | ORAL | Status: DC | PRN

## 2021-02-14 MED ORDER — OXYCODONE-ACETAMINOPHEN 5-325 MG PO TABS: 1.0000 | ORAL_TABLET | ORAL | Status: DC | PRN

## 2021-02-14 MED ORDER — PROSOURCE PLUS PO LIQD
30.0000 mL | Freq: Three times a day (TID) | ORAL | Status: DC
  Administered 2021-02-14 – 2021-02-15 (×3): 30 mL via ORAL
  Filled 2021-02-14 (×3): qty 30

## 2021-02-14 MED ORDER — ADULT MULTIVITAMIN LIQUID CH
15.0000 mL | Freq: Every day | ORAL | Status: DC
  Filled 2021-02-14: qty 15

## 2021-02-14 MED ORDER — ADULT MULTIVITAMIN W/MINERALS CH
1.0000 | ORAL_TABLET | Freq: Every day | ORAL | Status: DC
  Filled 2021-02-14: qty 1

## 2021-02-14 MED ORDER — HYDROCHLOROTHIAZIDE 10 MG/ML ORAL SUSPENSION: 12.5000 mg | Freq: Every day | ORAL | Status: DC

## 2021-02-14 MED ORDER — GABAPENTIN 300 MG PO CAPS
300.0000 mg | ORAL_CAPSULE | Freq: Every day | ORAL | Status: DC
  Filled 2021-02-14: qty 1

## 2021-02-14 MED ORDER — HYDROCHLOROTHIAZIDE 12.5 MG PO CAPS
12.5000 mg | ORAL_CAPSULE | Freq: Every day | ORAL | Status: DC
  Administered 2021-02-14 – 2021-02-15 (×2): 12.5 mg via ORAL
  Filled 2021-02-14 (×2): qty 1

## 2021-02-14 MED ORDER — OXYCODONE HCL 5 MG PO TABS
5.0000 mg | ORAL_TABLET | ORAL | Status: DC | PRN
Start: 2021-02-14 — End: 2021-02-16

## 2021-02-14 MED ORDER — ACETAMINOPHEN 325 MG PO TABS
650.0000 mg | ORAL_TABLET | Freq: Four times a day (QID) | ORAL | Status: DC
  Filled 2021-02-14: qty 2

## 2021-02-14 MED ORDER — DOCUSATE SODIUM 50 MG/5ML PO LIQD
100.0000 mg | Freq: Every day | ORAL | Status: DC
  Filled 2021-02-14: qty 10

## 2021-02-14 MED ORDER — GABAPENTIN 600 MG PO TABS: 600.0000 mg | ORAL_TABLET | Freq: Every day | ORAL | Status: DC

## 2021-02-14 MED ORDER — BOOST / RESOURCE BREEZE PO LIQD CUSTOM
1.0000 | Freq: Three times a day (TID) | ORAL | Status: DC
Start: 2021-02-14 — End: 2021-02-16
  Administered 2021-02-14 – 2021-02-15 (×3): 1 via ORAL

## 2021-02-14 MED ORDER — ROSUVASTATIN CALCIUM 20 MG PO TABS
20.0000 mg | ORAL_TABLET | Freq: Every day | ORAL | Status: DC
  Administered 2021-02-14 – 2021-02-15 (×2): 20 mg via ORAL
  Filled 2021-02-14 (×2): qty 1

## 2021-02-14 MED ORDER — GUAIFENESIN-DM 100-10 MG/5ML PO SYRP: 15.0000 mL | ORAL_SOLUTION | ORAL | Status: DC | PRN

## 2021-02-14 NOTE — Progress Notes (Addendum)
  Progress Note    02/14/2021 8:58 AM 8 Days Post-Op  Subjective:  no complaints.  No N/V yesterday after NG clamped.  BM yesterday.  Passing flatus frequently   Vitals:   02/14/21 0400 02/14/21 0855  BP: 140/86 116/83  Pulse: 90 94  Resp: (!) 22 20  Temp: 99.1 F (37.3 C) 98.8 F (37.1 C)  SpO2: 97% 97%   Physical Exam: Lungs:  non labored Incisions:  abd prevena changed to inpatient vac due to full prevena cannister; groin incisions healing well Extremities:  palpable and symmetrical DP pulses Abdomen:  soft, NT, ND Neurologic: A&O  CBC    Component Value Date/Time   WBC 32.2 (H) 02/14/2021 0117   RBC 3.79 (L) 02/14/2021 0117   HGB 12.3 (L) 02/14/2021 0117   HGB 17.3 01/21/2021 1617   HGB 16.9 01/21/2021 1617   HCT 36.7 (L) 02/14/2021 0117   HCT 49.4 01/21/2021 1617   HCT 48.8 01/21/2021 1617   PLT 819 (H) 02/14/2021 0117   PLT 644 (H) 01/21/2021 1617   PLT 647 (H) 01/21/2021 1617   MCV 96.8 02/14/2021 0117   MCV 93 01/21/2021 1617   MCV 94 01/21/2021 1617   MCH 32.5 02/14/2021 0117   MCHC 33.5 02/14/2021 0117   RDW 13.2 02/14/2021 0117   RDW 13.6 01/21/2021 1617   RDW 13.9 01/21/2021 1617   LYMPHSABS 4.2 (H) 01/21/2021 1617   LYMPHSABS 4.0 (H) 01/21/2021 1617   EOSABS 0.4 01/21/2021 1617   EOSABS 0.3 01/21/2021 1617   BASOSABS 0.2 01/21/2021 1617   BASOSABS 0.3 (H) 01/21/2021 1617    BMET    Component Value Date/Time   NA 134 (L) 02/14/2021 0117   NA 136 01/28/2021 1045   K 3.0 (L) 02/14/2021 0117   CL 93 (L) 02/14/2021 0117   CO2 30 02/14/2021 0117   GLUCOSE 95 02/14/2021 0117   BUN <5 (L) 02/14/2021 0117   BUN 10 01/28/2021 1045   CREATININE 0.70 02/14/2021 0117   CALCIUM 8.4 (L) 02/14/2021 0117   GFRNONAA >60 02/14/2021 0117    INR    Component Value Date/Time   INR 1.1 01/20/2021 1514     Intake/Output Summary (Last 24 hours) at 02/14/2021 0858 Last data filed at 02/14/2021 [id number] Gross per 24 hour  Intake 1664.15 ml  Output 2315 ml   Net -650.85 ml     Assessment/Plan:  60 y.o. male is s/p ABF with post op ileus 8 Days Post-Op   BLE well perfused GI: no n/v after NG clamped; check residual and plan to remove NG if insignificant; can advance diet slowly over the weekend Prevena cannister full; changed to inpatient vac; hopeful output will slow or else may need exploration Continue work with therapy teams   Dagoberto Ligas, PA-C [facility] [phone] 02/14/2021 8:58 AM  VASCULAR STAFF ADDENDUM: I have independently interviewed and examined the patient. I agree with the above.  Looks great.  No residual in stomach. NGT removed. I removed the VAC on the abdomen.  Incision healing nicely. Dressed with dry gauze. OK for sips. Go slow with diet.   Yevonne Aline. Stanford Breed, MD [facility] of [facility] Phone Number: [phone] 02/14/2021 2:08 PM

## 2021-02-14 NOTE — Progress Notes (Signed)
Mobility Specialist Progress Note    02/14/21 1503  Mobility  Activity Ambulated in hall  Level of Assistance Modified independent, requires aide device or extra time  Assistive Device Front wheel walker  Distance Ambulated (ft) 510 ft  Mobility Ambulated with assistance in hallway  Mobility Response Tolerated well  Mobility performed by Mobility specialist  Bed Position Chair  $Mobility charge 1 Mobility    Pre-Mobility: 87 HR, 127/83 BP, 96% SpO2 Post-Mobility: 98 HR, 119/81 BP, 96% SpO2  Pt found in bed and agreeable to mobility. Pt asx throughout walk and returned to chair with call bell.   Hildred Alamin Mobility Specialist  Mobility Specialist Phone: [phone]

## 2021-02-14 NOTE — Progress Notes (Signed)
Initial Nutrition Assessment  DOCUMENTATION CODES:  Not applicable  INTERVENTION:  Advance diet as medically able and as tolerated.  Add Boost Breeze po TID, each supplement provides 250 kcal and 9 grams of protein.  Add 30 ml ProSource Plus po TID, each supplement provides 100 kcal and 15 grams of protein.   Add MVI with minerals daily.  Discontinue liquid MVI.  If diet does not advance past full liquids in 48 hrs, consider nutrition support.  NUTRITION DIAGNOSIS:  Inadequate oral intake related to acute illness (post-op ileus) as evidenced by other (comment) (NPO/CL diet).  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 Now s/p ABF with post op ileus  Spoke with pt at bedside. Pt reports that he is feeling hunger and is wanting to eat something solid. He reports eating well PTA.  Per Vascular, plan is to advance diet slowly over the weekend.  Pt with no history in Epic or Care Everywhere regarding weight. Pt unsure of any weight changes, as he does not keep up with it at home.  Recommend advancing diet as soon as possible. If diet does not advance past liquids in 48 hrs, recommend starting nutrition support.  Add Boost Breeze TID and ProSource Plus TID as well as change MVI to tablet from liquid.  Medications: reviewed; colace, folic acid, liquid MVI, Protonix, thiamine, D5 and NaCl with K-Cl 20 mEq @ 75 ml/hr  Labs: reviewed; Na 134 (L), K 3 (L)  NUTRITION - FOCUSED PHYSICAL EXAM: Flowsheet Row Most Recent Value  Orbital Region No depletion  Upper Arm Region Mild depletion  Thoracic and Lumbar Region No depletion  Buccal Region No depletion   Temple Region No depletion  Clavicle Bone Region Mild depletion  Clavicle and Acromion Bone Region Mild depletion  Scapular Bone Region Unable to assess  Dorsal Hand No depletion  Patellar Region No depletion  Anterior Thigh Region No depletion  Posterior Calf Region No depletion  Edema (RD Assessment) None  Hair Reviewed  Eyes Reviewed  Mouth Reviewed  Skin Reviewed  Nails Reviewed   Diet Order:   Diet Order             Diet clear liquid Room service appropriate? Yes; Fluid consistency: Thin  Diet effective now                  EDUCATION NEEDS:  Education needs have been addressed  Skin:  Skin Assessment: Skin Integrity Issues: Skin Integrity Issues:: Incisions Incisions: Abdomen, closed and groin, closed  Last BM:  02/12/21 - per pt report  Height:  Ht Readings from Last 1 Encounters:  02/06/21 '5\' 6"'$  (1.676 m)   Weight:  Wt Readings from Last 1 Encounters:  02/08/21 59.2 kg   BMI:  Body mass index is 21.07 kg/m.  Estimated Nutritional Needs:  Kcal:  2000-2200 Protein:  75-90 grams Fluid:  >2 L  Derrel Nip, RD, LDN (she/her/hers) Registered Dietitian I After-Hours/Weekend Pager # in [place]

## 2021-02-15 LAB — CBC
HCT: 33.6 % — ABNORMAL LOW (ref 39.0–52.0)
Hemoglobin: 11.4 g/dL — ABNORMAL LOW (ref 13.0–17.0)
MCH: 32.4 pg (ref 26.0–34.0)
MCHC: 33.9 g/dL (ref 30.0–36.0)
MCV: 95.5 fL (ref 80.0–100.0)
Platelets: 825 10*3/uL — ABNORMAL HIGH (ref 150–400)
RBC: 3.52 MIL/uL — ABNORMAL LOW (ref 4.22–5.81)
RDW: 13.2 % (ref 11.5–15.5)
WBC: 28.4 10*3/uL — ABNORMAL HIGH (ref 4.0–10.5)
nRBC: 0 % (ref 0.0–0.2)

## 2021-02-15 LAB — BASIC METABOLIC PANEL
Anion gap: 9 (ref 5–15)
BUN: <5 mg/dL — ABNORMAL LOW (ref 6–20)
CO2: 30 mmol/L (ref 22–32)
Calcium: 7.9 mg/dL — ABNORMAL LOW (ref 8.9–10.3)
Chloride: 92 mmol/L — ABNORMAL LOW (ref 98–111)
Creatinine, Ser: 0.66 mg/dL (ref 0.61–1.24)
GFR, Estimated: >60 mL/min (ref >60)
Glucose, Bld: 103 mg/dL — ABNORMAL HIGH (ref 70–99)
Potassium: 2.7 mmol/L — CL (ref 3.5–5.1)
Sodium: 131 mmol/L — ABNORMAL LOW (ref 135–145)

## 2021-02-15 MED ORDER — POTASSIUM CHLORIDE 10 MEQ/100ML IV SOLN
10.0000 meq | INTRAVENOUS | Status: AC
Start: 2021-02-15 — End: 2021-02-15
  Administered 2021-02-15 (×5): 10 meq via INTRAVENOUS
  Filled 2021-02-15 (×5): qty 100

## 2021-02-15 MED ORDER — PANTOPRAZOLE SODIUM 40 MG PO TBEC
40.0000 mg | DELAYED_RELEASE_TABLET | Freq: Every day | ORAL | Status: DC
  Filled 2021-02-15: qty 1

## 2021-02-15 NOTE — Progress Notes (Addendum)
Critical lab reported on pt Potassium 2.7. Notified Dr. Stanford Breed about pt new orders received to give pt Potassium Chloride 10 Meq 153m   x 6 bags every hour.

## 2021-02-15 NOTE — Progress Notes (Signed)
[facility] PROGRESS NOTE  ASSESSMENT / PLAN: Brandon Lara is a 60 y.o. male POD#9 status post aortobifemoral bypass graft. Ileus which appears to be resolving. Will trial regular diet. If he tolerates this today, may be ready for home tomorrow.   SUBJECTIVE: Feels great. Moving bowels and passing gas.  OBJECTIVE: BP 95/73 (BP Location: Left Arm)   Pulse (!) 103   Temp 100 F (37.8 C) (Oral)   Resp 20   Ht '5\' 6"'$  (1.676 m)   Wt 59.2 kg   SpO2 100%   BMI 21.07 kg/m   Intake/Output Summary (Last 24 hours) at 02/15/2021 0940 Last data filed at 02/15/2021 0824 Gross per 24 hour  Intake 2301.15 ml  Output 1075 ml  Net 1226.15 ml    No acute distress.  RRR Unlabored breathing Soft, non-tender abdomen Incisions clean and dry 2+ DP pulses  CBC Latest Ref Rng & Units 02/15/2021 02/14/2021 02/13/2021  WBC 4.0 - 10.5 K/uL 28.4(H) 32.2(H) 29.1(H)  Hemoglobin 13.0 - 17.0 g/dL 11.4(L) 12.3(L) 11.5(L)  Hematocrit 39.0 - 52.0 % 33.6(L) 36.7(L) 33.7(L)  Platelets 150 - 400 K/uL 825(H) 819(H) 712(H)     CMP Latest Ref Rng & Units 02/15/2021 02/14/2021 02/13/2021  Glucose 70 - 99 mg/dL 103(H) 95 101(H)  BUN 6 - 20 mg/dL <5(L) <5(L) <5(L)  Creatinine 0.61 - 1.24 mg/dL 0.66 0.70 0.61  Sodium 135 - 145 mmol/L 131(L) 134(L) 132(L)  Potassium 3.5 - 5.1 mmol/L 2.7(LL) 3.0(L) 3.4(L)  Chloride 98 - 111 mmol/L 92(L) 93(L) 97(L)  CO2 22 - 32 mmol/L '30 30 27  '$ Calcium 8.9 - 10.3 mg/dL 7.9(L) 8.4(L) 7.9(L)  Total Protein 6.5 - 8.1 g/dL - - -  Total Bilirubin 0.3 - 1.2 mg/dL - - -  Alkaline Phos 38 - 126 U/L - - -  AST 15 - 41 U/L - - -  ALT 0 - 44 U/L - - -    Estimated Creatinine Clearance: 83.3 mL/min (by C-G formula based on SCr of 0.66 mg/dL).  Yevonne Aline. Stanford Breed, MD [facility] Phone Number: [phone] 02/15/2021 9:40 AM

## 2021-02-15 NOTE — Progress Notes (Signed)
  Progress Note    02/15/2021 9:36 AM 9 Days Post-Op  Subjective:  pt feeling much better.  Denies nausea/vomiting with clear liquids.  Had large BM last night.  Tm 100 HR 90's-100's NSR  [id number] systolic 0000000 RA  Vitals:   02/15/21 0510 02/15/21 0824  BP:  95/73  Pulse:  (!) 103  Resp:  20  Temp: 99.4 F (37.4 C) 100 F (37.8 C)  SpO2:  100%    Physical Exam: General:  resting comfortably; no distress Lungs:  non labored Incisions:  midline incision drainage resolved; bilateral groins healing nicely Abdomen:  soft, NT; +BM  CBC    Component Value Date/Time   WBC 28.4 (H) 02/15/2021 0149   RBC 3.52 (L) 02/15/2021 0149   HGB 11.4 (L) 02/15/2021 0149   HGB 17.3 01/21/2021 1617   HGB 16.9 01/21/2021 1617   HCT 33.6 (L) 02/15/2021 0149   HCT 49.4 01/21/2021 1617   HCT 48.8 01/21/2021 1617   PLT 825 (H) 02/15/2021 0149   PLT 644 (H) 01/21/2021 1617   PLT 647 (H) 01/21/2021 1617   MCV 95.5 02/15/2021 0149   MCV 93 01/21/2021 1617   MCV 94 01/21/2021 1617   MCH 32.4 02/15/2021 0149   MCHC 33.9 02/15/2021 0149   RDW 13.2 02/15/2021 0149   RDW 13.6 01/21/2021 1617   RDW 13.9 01/21/2021 1617   LYMPHSABS 4.2 (H) 01/21/2021 1617   LYMPHSABS 4.0 (H) 01/21/2021 1617   EOSABS 0.4 01/21/2021 1617   EOSABS 0.3 01/21/2021 1617   BASOSABS 0.2 01/21/2021 1617   BASOSABS 0.3 (H) 01/21/2021 1617    BMET    Component Value Date/Time   NA 131 (L) 02/15/2021 0149   NA 136 01/28/2021 1045   K 2.7 (LL) 02/15/2021 0149   CL 92 (L) 02/15/2021 0149   CO2 30 02/15/2021 0149   GLUCOSE 103 (H) 02/15/2021 0149   BUN <5 (L) 02/15/2021 0149   BUN 10 01/28/2021 1045   CREATININE 0.66 02/15/2021 0149   CALCIUM 7.9 (L) 02/15/2021 0149   GFRNONAA >60 02/15/2021 0149    INR    Component Value Date/Time   INR 1.1 01/20/2021 1514     Intake/Output Summary (Last 24 hours) at 02/15/2021 0936 Last data filed at 02/15/2021 [id number] Gross per 24 hour  Intake 2301.15 ml  Output  1075 ml  Net 1226.15 ml     Assessment/Plan:  60 y.o. male is s/p:  ABF bypass  9 Days Post-Op   -midline incisional drainage resolved and incisions are healing nicely. -tolerating clears-will advance to regular diet -pt with Tm 100 and has leukocytosis of 28.4k but this is down from yesterday at 32.2k.  will d/w Dr. Stanford Breed -DVT prophylaxis:  sq heparin -continue to mobilize   Leontine Locket, PA-C [facility] [phone] 02/15/2021 9:36 AM

## 2021-02-16 LAB — BASIC METABOLIC PANEL
Anion gap: 11 (ref 5–15)
BUN: 8 mg/dL (ref 6–20)
CO2: 29 mmol/L (ref 22–32)
Calcium: 8 mg/dL — ABNORMAL LOW (ref 8.9–10.3)
Chloride: 94 mmol/L — ABNORMAL LOW (ref 98–111)
Creatinine, Ser: 0.84 mg/dL (ref 0.61–1.24)
GFR, Estimated: >60 mL/min (ref >60)
Glucose, Bld: 104 mg/dL — ABNORMAL HIGH (ref 70–99)
Potassium: 2.9 mmol/L — ABNORMAL LOW (ref 3.5–5.1)
Sodium: 134 mmol/L — ABNORMAL LOW (ref 135–145)

## 2021-02-16 LAB — CBC
HCT: 34 % — ABNORMAL LOW (ref 39.0–52.0)
Hemoglobin: 11.2 g/dL — ABNORMAL LOW (ref 13.0–17.0)
MCH: 31.9 pg (ref 26.0–34.0)
MCHC: 32.9 g/dL (ref 30.0–36.0)
MCV: 96.9 fL (ref 80.0–100.0)
Platelets: 917 10*3/uL (ref 150–400)
RBC: 3.51 MIL/uL — ABNORMAL LOW (ref 4.22–5.81)
RDW: 13.3 % (ref 11.5–15.5)
WBC: 29.4 10*3/uL — ABNORMAL HIGH (ref 4.0–10.5)
nRBC: 0 % (ref 0.0–0.2)

## 2021-02-16 MED ORDER — POTASSIUM CHLORIDE CRYS ER 20 MEQ PO TBCR: 40.0000 meq | EXTENDED_RELEASE_TABLET | Freq: Once | ORAL | Status: DC

## 2021-02-16 MED ORDER — OXYCODONE-ACETAMINOPHEN 5-325 MG PO TABS: 1.0000 | ORAL_TABLET | Freq: Four times a day (QID) | ORAL | 0 refills | Status: DC | PRN

## 2021-02-16 MED ORDER — POTASSIUM CHLORIDE CRYS ER 20 MEQ PO TBCR
40.0000 meq | EXTENDED_RELEASE_TABLET | Freq: Once | ORAL | Status: AC
  Administered 2021-02-16: 40 meq via ORAL
  Filled 2021-02-16: qty 2

## 2021-02-16 MED ORDER — ADULT MULTIVITAMIN W/MINERALS CH: 1.0000 | ORAL_TABLET | Freq: Every day | ORAL | Status: DC

## 2021-02-16 NOTE — Discharge Instructions (Addendum)
[facility]  Discharge Instructions   Open Aortic Surgery  Please refer to the following instructions for your post-procedure care. Your surgeon or Physician Assistant will discuss any changes with you.  Activity  Avoid lifting more than eight pounds (a gallon of milk) until after your first post-operative visit. You are encouraged to walk as much as you can. You can slowly return to normal activities but must avoid strenuous activity and heavy lifting until your doctor tells you it's okay. Heavy lifting can hurt the incision and cause a hernia. Avoid activities such as vacuuming or swinging a golf club. It is normal to feel tired for several weeks after your surgery. Do not drive until your doctor gives the okay and you are no longer taking prescription pain medications. It is also normal to have difficulty with sleep habits, eating and bowl movements after surgery. These will go away with time.  Bathing/Showering  Shower daily after you go home. Do not soak in a bathtub, hot tub, or swim until the incision heals.  Incision Care  Shower every day. Clean your incision with mild soap and water. Pat the area dry with a clean towel. You do not need a bandage unless otherwise instructed. Do not apply any ointments or creams to your incision. You may have skin glue on your incision. Do not peel it off. It will come off on its own in about one week. If you have staples or sutures along your incision, they will be removed at your post op appointment.  If you have groin incisions, wash the groin wounds with soap and water daily and pat dry. (No tub bath-only shower)  Then put a dry gauze or washcloth in the groin to keep this area dry to help prevent wound infection.  Do this daily and as needed.  Do not use Vaseline or neosporin on your incisions.  Only use soap and water on your incisions and then protect and keep dry.  Diet  Resume your normal diet. There are no  special food restriction following this procedure. A low fat/low cholesterol diet is recommended for all patients with vascular disease. After your aortic surgery, it's normal to feel full faster than usual and to not feel as hungry as you normally would. You will probably lose weight initially following your surgery. It's best to eat small, frequent meals over the course of the day. Call the office if you find that you are unable to eat even small meals.   In order to heal from your surgery, it is CRITICAL to get adequate nutrition. Your body requires vitamins, minerals, and protein. Vegetables are the best source of vitamins and minerals. If you have pain, you may take over-the-counter pain reliever such as acetaminophen (Tylenol). If you were prescribed a stronger pain medication, please be aware these medication can cause nausea and constipation. Prevent nausea by taking the medication with a snack or meal. Avoid constipation by drinking plenty of fluids and eating foods with a high amount of fiber, such as fruits, vegetables and grains. Take '100mg'$  of the over-the-counter stool softener Colace twice a day as needed to help with constipation. A laxative, such as Milk of Magnesia, may be recommended for you at this time. Do not take a laxative unless your surgeon or P.A. tells you it's OK.  Do not take Tylenol if you are taking stronger pain medications (such as Percocet).  Stop taking your HCTZ until your follow up with your  primary care physician.  Follow Up  Our office will schedule a follow up appointment 2-3 weeks after discharge.  Please call us immediately for any of the following conditions    .     Severe or worsening pain in your legs or feet or in your abdomen back or chest. Increased pain, redness drainage (pus) from your incision site. Increased abdominal pain, bloating, nausea, vomiting, or persistent diarrhea. Fever of 101 degrees or higher. Swelling in your leg (s).  Reduce your  risk of vascular disease  Stop smoking. If you would like help, call [facility] at 1-800-QUIT-NOW 478 680 5039) or [place] at [phone]. Manage your cholesterol Maintain a desired weight Control your diabetes Keep your blood pressure down  If you have any questions please call the office at [phone].

## 2021-02-16 NOTE — Discharge Summary (Signed)
Open Aortic Surgery Discharge Summary    Brandon Lara [birth year] 60 y.o. male  [id number]  Admission Date: 02/06/2021  Discharge Date: 02/16/2021  Physician: Cherre Robins, MD  Admission Diagnosis: Aortoiliac occlusive disease [facility]) [I74.09]   HPI:   This is a 60 y.o. male referred to clinic for evaluation of severe right lower extremity pain at rest.  He was seen by Dr. Gladstone Lighter with orthopedics who identified pulseless right leg and referred him for urgent evaluation.  The patient reports progressive symptoms of intermittent claudication which have worsened over the past several months.  He now has pain at rest.  The right leg is worse.  He has no ulceration about his lower extremities.  He is a longtime smoker.  He is otherwise healthy to the best of his knowledge.  Hospital Course:  The patient was admitted to the hospital and taken to the operating room on 02/06/2021 and underwent: Aortobifemoral bypass graft (18 x 9 mm dacron) Omentopexy    Findings:  Infrarenal aorta suitable for side biting aortic clamp.  Proximal anastomosis performed and with felt reinforced 3-0 Prolene suture.  Multiple pledgeted pair sutures required for hemostasis.  Distal aortic stump oversewn proximal to IMA.  Distal anastomoses were performed end-to-side to the common femoral artery.  Upon completion excellent Doppler flow in the profunda and superficial femoral arteries bilaterally.  The pt tolerated the procedure well and was transported to the PACU in good condition.   By POD 1, pt doing well.  Has chronic leukocytosis most likely related to smoking.  He has good doppler flow bilateral feet.  He was transferred to the floor that day.  POD 2, no flatus with mild distension of abdomen.  Go slow with diet.  Encourage IS.    POD 3, he did have some emesis with clear liquids and npo for now.  Dulcolax given.    POD 4, he has palpable pedal pulses and moving bowels and passing flatus.  Go slow  with diet.  Later that night, he vomited again and afraid to eat.   POD 5, NGT placed for ileus. He also started having some serosanguinous drainage from his laparotomy incision.  Prevena vac placed over site.  No sign of dehiscence.   POD 6, He was feeling better with NGT in place.  He was starting to pass flatus.  POD 7, pt continues to have palpable pedal pulses.  Continues to pass flatus.  NGT was clamped.      POD 8, pt doing well and NGT removed.  Prevena removed and drainage resolved.  Ok for sips.  Go slow with diet.   POD 9, he tolerated clears and was advanced to regular diet.   POD 10, pt tolerated regular diet.  He did have hypokalemia of 2.9 and was supplemented K-dur 53mq x 1 dose.  His HCTZ was discontinued at this time and will be re-evaluated at his appt with PCP in a couple of weeks.  His BP is [id number].  He is discharged home.  CBC    Component Value Date/Time   WBC 29.4 (H) 02/16/2021 0038   RBC 3.51 (L) 02/16/2021 0038   HGB 11.2 (L) 02/16/2021 0038   HGB 17.3 01/21/2021 1617   HGB 16.9 01/21/2021 1617   HCT 34.0 (L) 02/16/2021 0038   HCT 49.4 01/21/2021 1617   HCT 48.8 01/21/2021 1617   PLT 917 (HH) 02/16/2021 0038   PLT 644 (H) 01/21/2021 1617   PLT 647 (H) 01/21/2021  1617   MCV 96.9 02/16/2021 0038   MCV 93 01/21/2021 1617   MCV 94 01/21/2021 1617   MCH 31.9 02/16/2021 0038   MCHC 32.9 02/16/2021 0038   RDW 13.3 02/16/2021 0038   RDW 13.6 01/21/2021 1617   RDW 13.9 01/21/2021 1617   LYMPHSABS 4.2 (H) 01/21/2021 1617   LYMPHSABS 4.0 (H) 01/21/2021 1617   EOSABS 0.4 01/21/2021 1617   EOSABS 0.3 01/21/2021 1617   BASOSABS 0.2 01/21/2021 1617   BASOSABS 0.3 (H) 01/21/2021 1617    BMET    Component Value Date/Time   NA 134 (L) 02/16/2021 0038   NA 136 01/28/2021 1045   K 2.9 (L) 02/16/2021 0038   CL 94 (L) 02/16/2021 0038   CO2 29 02/16/2021 0038   GLUCOSE 104 (H) 02/16/2021 0038   BUN 8 02/16/2021 0038   BUN 10 01/28/2021 1045    CREATININE 0.84 02/16/2021 0038   CALCIUM 8.0 (L) 02/16/2021 0038   GFRNONAA >60 02/16/2021 0038     Discharge Instructions     Discharge patient   Complete by: As directed    Discharge disposition: 01-Home or Self Care   Discharge patient date: 02/16/2021       Discharge Diagnosis:  Aortoiliac occlusive disease ([place]) [I74.09]  Secondary Diagnosis: Patient Active Problem List   Diagnosis Date Noted   Aortoiliac occlusive disease ([place]) 02/06/2021   Hyperlipidemia 01/29/2021   Hypertension 01/22/2021   Leukocytosis 01/22/2021   Thrombocytosis 01/22/2021   Health care maintenance 01/22/2021   COPD (chronic obstructive pulmonary disease) ([place]) 01/22/2021   Atherosclerosis of aorta ([place]) 01/22/2021   PAD (peripheral artery disease) ([place]) 01/09/2021   Tobacco use disorder, severe, in early remission 01/09/2021   Past Medical History:  Diagnosis Date   Alcohol use    6-7 beers/day (as of 01/21/21)   Hypertension    PAD (peripheral artery disease) ([facility])    Tobacco abuse      Allergies as of 02/16/2021       Reactions   Penicillins Rash        Medication List     STOP taking these medications    hydrochlorothiazide 12.5 MG capsule Commonly known as: MICROZIDE       TAKE these medications    aspirin EC 81 MG tablet Take 81 mg by mouth daily. Swallow whole.   gabapentin 300 MG capsule Commonly known as: NEURONTIN Take 300-600 mg by mouth See admin instructions. Take 1 capsule (300 mg) by mouth in the morning & take 2 capsules (600 mg) by mouth in the evening.   multivitamin with minerals Tabs tablet Take 1 tablet by mouth daily.   oxyCODONE-acetaminophen 5-325 MG tablet Commonly known as: Percocet Take 1 tablet by mouth every 6 (six) hours as needed.   rosuvastatin 20 MG tablet Commonly known as: Crestor Take 1 tablet (20 mg total) by mouth daily.   varenicline 1 MG tablet Commonly known as: CHANTIX Take 1 tablet (1 mg total) by mouth 2 (two)  times daily.        Instructions:  [facility] Discharge Instructions   Open Aortic Surgery  Please refer to the following instructions for your post-procedure care. Your surgeon or Physician Assistant will discuss any changes with you.  Activity  Avoid lifting more than eight pounds (a gallon of milk) until after your first post-operative visit. You are encouraged to walk as much as you can. You can slowly return to normal activities but must avoid strenuous  activity and heavy lifting until your doctor tells you it's okay. Heavy lifting can hurt the incision and cause a hernia. Avoid activities such as vacuuming or swinging a golf club. It is normal to feel tired for several weeks after your surgery. Do not drive until your doctor gives the okay and you are no longer taking prescription pain medications. It is also normal to have difficulty with sleep habits, eating and bowl movements after surgery. These will go away with time.  Bathing/Showering  Shower daily after you go home. Do not soak in a bathtub, hot tub, or swim until the incision heals.  Incision Care  Shower every day. Clean your incision with mild soap and water. Pat the area dry with a clean towel. You do not need a bandage unless otherwise instructed. Do not apply any ointments or creams to your incision. You may have skin glue on your incision. Do not peel it off. It will come off on its own in about one week. If you have staples or sutures along your incision, they will be removed at your post op appointment.  If you have groin incisions, wash the groin wounds with soap and water daily and pat dry. (No tub bath-only shower)  Then put a dry gauze or washcloth in the groin to keep this area dry to help prevent wound infection.  Do this daily and as needed.  Do not use Vaseline or neosporin on your incisions.  Only use soap and water on your incisions and then protect and keep  dry.  Diet  Resume your normal diet. There are no special food restriction following this procedure. A low fat/low cholesterol diet is recommended for all patients with vascular disease. After your aortic surgery, it's normal to feel full faster than usual and to not feel as hungry as you normally would. You will probably lose weight initially following your surgery. It's best to eat small, frequent meals over the course of the day. Call the office if you find that you are unable to eat even small meals.   In order to heal from your surgery, it is CRITICAL to get adequate nutrition. Your body requires vitamins, minerals, and protein. Vegetables are the best source of vitamins and minerals.  If you have pain, you may take over-the-counter pain reliever such as acetaminophen (Tylenol). If you were prescribed a stronger pain medication, please be aware these medication can cause nausea and constipation. Prevent nausea by taking the medication with a snack or meal. Avoid constipation by drinking plenty of fluids and eating foods with a high amount of fiber, such as fruits, vegetables and grains. Take '100mg'$  of the over-the-counter stool softener Colace twice a day as needed to help with constipation. A laxative, such as Milk of Magnesia, may be recommended for you at this time. Do not take a laxative unless your surgeon or P.A. tells you it's OK.  Do not take Tylenol if you are taking stronger pain medications (such as Percocet).  Stop taking your HCTZ until your follow up with your primary care physician.  Follow Up  Our office will schedule a follow up appointment 2-3 weeks after discharge.  Please call us immediately for any of the following conditions    .     Severe or worsening pain in your legs or feet or in your abdomen back or chest. Increased pain, redness drainage (pus) from your incision site. Increased abdominal pain, bloating, nausea, vomiting, or persistent diarrhea. Fever of 101  degrees or higher. Swelling in your leg (s).  Reduce your risk of vascular disease  Stop smoking. If you would like help, call [facility] at 1-800-QUIT-NOW 740-280-2226) or [place] at (930) [phone]. Manage your cholesterol Maintain a desired weight Control your diabetes Keep your blood pressure down  If you have any questions please call the office at [phone].   Prescriptions given: 1.  Roxicet #20 No Refill  Disposition: home  Patient's condition: is Good  Follow up: 1. Dr. Stanford Breed in 2 weeks with ABI 2. Internal Medicine in 2 weeks   Leontine Locket, [place] [facility] [phone] 02/16/2021  8:44 AM   - For VQI Registry use -  Post-op:  Time to Extubation: '[x]'$  In OR, '[ ]'$  < 12 hrs, '[ ]'$  12-24 hrs, '[ ]'$  >=24 hrs Vasopressors Req. Post-op: No ICU Stay: 1 day in ICU Transfusion: No   If yes, n/a units given MI: No, '[ ]'$  Troponin only, '[ ]'$  EKG or Clinical New Arrhythmia: No  Complications: CHF: No Resp failure: No, '[ ]'$  Pneumonia, '[ ]'$  Ventilator Chg in renal function: No, '[ ]'$  Inc. Cr > 0.5, '[ ]'$  Temp. Dialysis, '[ ]'$  Permanent dialysis Leg ischemia: No, no Surgery needed, '[ ]'$  Yes, Surgery needed, '[ ]'$  Amputation Bowel ischemia: No, '[ ]'$  Medical Rx, '[ ]'$  Surgical Rx Wound complication: No, '[ ]'$  Superficial separation/infection, '[ ]'$  Return to OR Return to OR: No  Return to OR for bleeding: No Stroke: No, '[ ]'$  Minor, '[ ]'$  Major  Discharge medications: Statin use:  Yes  ASA use:  Yes   Plavix use:  No  Beta blocker use:  No  ACEI use:  No ARB use:  No CCB use:  No Coumadin use:  No

## 2021-02-16 NOTE — Progress Notes (Signed)
s/w Dr Jamelle Haring in regards to platelet count of 917.  No new orders received.

## 2021-02-16 NOTE — Progress Notes (Addendum)
Progress Note    02/16/2021 7:33 AM 10 Days Post-Op  Subjective:  no complaints; tolerated solid food yesterday with BM last night and feels like he will have one this morning.   Tm 99.4 HR 70's-100's  99991111 systolic 0000000 RA  Vitals:   02/15/21 2330 02/16/21 0436  BP: 97/60 101/69  Pulse: 89 89  Resp: 19 18  Temp: 98.7 F (37.1 C) 99.1 F (37.3 C)  SpO2: 95% 98%    Physical Exam: Cardiac:  regular Lungs:  non labored Incisions:  laparotomy incision is clean and dry; appears drainage has resolved.  Extremities:  easily palpable DP pulses bilaterally Abdomen:  soft, NT/ND; +flatus; +BM  CBC    Component Value Date/Time   WBC 29.4 (H) 02/16/2021 0038   RBC 3.51 (L) 02/16/2021 0038   HGB 11.2 (L) 02/16/2021 0038   HGB 17.3 01/21/2021 1617   HGB 16.9 01/21/2021 1617   HCT 34.0 (L) 02/16/2021 0038   HCT 49.4 01/21/2021 1617   HCT 48.8 01/21/2021 1617   PLT 917 (HH) 02/16/2021 0038   PLT 644 (H) 01/21/2021 1617   PLT 647 (H) 01/21/2021 1617   MCV 96.9 02/16/2021 0038   MCV 93 01/21/2021 1617   MCV 94 01/21/2021 1617   MCH 31.9 02/16/2021 0038   MCHC 32.9 02/16/2021 0038   RDW 13.3 02/16/2021 0038   RDW 13.6 01/21/2021 1617   RDW 13.9 01/21/2021 1617   LYMPHSABS 4.2 (H) 01/21/2021 1617   LYMPHSABS 4.0 (H) 01/21/2021 1617   EOSABS 0.4 01/21/2021 1617   EOSABS 0.3 01/21/2021 1617   BASOSABS 0.2 01/21/2021 1617   BASOSABS 0.3 (H) 01/21/2021 1617    BMET    Component Value Date/Time   NA 134 (L) 02/16/2021 0038   NA 136 01/28/2021 1045   K 2.9 (L) 02/16/2021 0038   CL 94 (L) 02/16/2021 0038   CO2 29 02/16/2021 0038   GLUCOSE 104 (H) 02/16/2021 0038   BUN 8 02/16/2021 0038   BUN 10 01/28/2021 1045   CREATININE 0.84 02/16/2021 0038   CALCIUM 8.0 (L) 02/16/2021 0038   GFRNONAA >60 02/16/2021 0038    INR    Component Value Date/Time   INR 1.1 01/20/2021 1514     Intake/Output Summary (Last 24 hours) at 02/16/2021 0733 Last data filed at  02/16/2021 0438 Gross per 24 hour  Intake 480 ml  Output 850 ml  Net -370 ml   Component Ref Range & Units 3 wk ago  (01/21/21) 3 wk ago  (01/21/21) 3 wk ago  (01/20/21)  Path Rev WBC  Comment     Comment: Increased with a mild neutrophilia, lymphocytosis, and  monocytosis. A polymorphous population of lymphocytes is  identified.   Path Rev RBC  Appear normal.     Path Rev PLTs  Comment Abnormal      Comment: Thrombocytosis with mild excess size and shape variation. Bizzare  platelets not seen.   PATH INTERP BLD-IMP  Comment     Comment: Neutrophilia, lymphocytosis and monocytosis  Comment: The findings can be seen in acute/chronic infection  or inflammatory state.  Clinical correlation is necessary.    Assessment/Plan:  60 y.o. male is s/p:  ABF bypass grafting  10 Days Post-Op   -pt doing well from surgery standpoint as he is tolerating solid food, having BM's, palpable pedal pulses, he is ambulating without difficulty, incisions healing nicely.  His drainage has resolved.   -hypokalemia-will order k-dur this am.  Discussed with Dr. Stanford Breed  and at this time, will dc his HCTZ and this can be re-evaluated by his PCP next month.   He also has thrombocytosis and leukocytosis.  Previous smear on 01/21/2021 as above.   -Pt states he will have f/u with Internal Medicine here at the hospital early next month.   -continue asa/statin -has not needed pain medication for the past couple of days.  Discussed with pt that we will send some pain medication to his pharmacy as he may need this with increased activity once he gets home.   -DVT prophylaxis:  sq heparin   Leontine Locket, PA-C [facility] [phone] 02/16/2021 7:33 AM  VASCULAR STAFF ADDENDUM: I have independently interviewed and examined the patient. I agree with the above.  Ready for DC. Stop HCTZ given persistent hypoK. Will replete today.  Follow up with me in 2-3 weeks. Counseled on discharge  precautions. Instructed to call with any problems.  Yevonne Aline. Stanford Breed, MD [facility] Phone Number: [phone] 02/16/2021 9:19 AM

## 2021-02-18 LAB — PATHOLOGIST SMEAR REVIEW

## 2021-02-19 ENCOUNTER — Ambulatory Visit (INDEPENDENT_AMBULATORY_CARE_PROVIDER_SITE_OTHER): Payer: BC Managed Care – PPO | Admitting: Student

## 2021-02-19 ENCOUNTER — Encounter: Payer: Self-pay | Admitting: Student

## 2021-02-19 VITALS — BP 140/88 | HR 93 | Wt 117.5 lb

## 2021-02-19 DIAGNOSIS — I10 Essential (primary) hypertension: Secondary | ICD-10-CM | POA: Diagnosis not present

## 2021-02-19 DIAGNOSIS — D72829 Elevated white blood cell count, unspecified: Secondary | ICD-10-CM

## 2021-02-19 DIAGNOSIS — I739 Peripheral vascular disease, unspecified: Secondary | ICD-10-CM | POA: Diagnosis not present

## 2021-02-19 DIAGNOSIS — F17201 Nicotine dependence, unspecified, in remission: Secondary | ICD-10-CM

## 2021-02-19 DIAGNOSIS — K5790 Diverticulosis of intestine, part unspecified, without perforation or abscess without bleeding: Secondary | ICD-10-CM

## 2021-02-19 NOTE — Patient Instructions (Addendum)
Blood pressure We will recheck you potassium today and I will call you with the results  We will likely restart you old medicine if you potassium is improved   I have made a referral to hematology for the elevated white count

## 2021-02-20 LAB — BMP8+ANION GAP
Anion Gap: 16 mmol/L (ref 10.0–18.0)
BUN/Creatinine Ratio: 11 (ref 9–20)
BUN: 7 mg/dL (ref 6–24)
CO2: 26 mmol/L (ref 20–29)
Calcium: 8.4 mg/dL — ABNORMAL LOW (ref 8.7–10.2)
Chloride: 91 mmol/L — ABNORMAL LOW (ref 96–106)
Creatinine, Ser: 0.66 mg/dL — ABNORMAL LOW (ref 0.76–1.27)
Glucose: 82 mg/dL (ref 65–99)
Potassium: 3.7 mmol/L (ref 3.5–5.2)
Sodium: 133 mmol/L — ABNORMAL LOW (ref 134–144)
eGFR: 108 mL/min/{1.73_m2} (ref >59)

## 2021-02-21 ENCOUNTER — Other Ambulatory Visit: Payer: Self-pay

## 2021-02-21 DIAGNOSIS — K5790 Diverticulosis of intestine, part unspecified, without perforation or abscess without bleeding: Secondary | ICD-10-CM

## 2021-02-21 DIAGNOSIS — I739 Peripheral vascular disease, unspecified: Secondary | ICD-10-CM

## 2021-02-21 HISTORY — DX: Diverticulosis of intestine, part unspecified, without perforation or abscess without bleeding: K57.90

## 2021-02-21 NOTE — Assessment & Plan Note (Signed)
Patient has stopped smoking since her surgery.  He had Chantix while in the hospital but stopped after he was discharged.  Has not smoked since discharge.  Doing well without the Chantix currently denies any cravings at this time.  Congratulated him and encouraged him to continue to refrain from smoking.

## 2021-02-21 NOTE — Progress Notes (Signed)
   CC: hospital follow up   HPI:  [name] is a 60 y.o. male with history below presents for hospital follow up.  Patient was admitted from 9/1 to 09/112022 for worsening right lower extremity pain secondary to aortoiliac occlusive disease.  And underwent bilateral aortofemoral bypass grafting during his hospitalization.  Full course was complicated by postop ileus requiring NG tube placement.  He has been feeling well since admission to the hospital he plans on transferring care in January to [place] [facility]. please refer to problem based charting for further details and assessment and plan of current problem and chronic medical conditions.   Past Medical History:  Diagnosis Date   Alcohol use    6-7 beers/day (as of 01/21/21)   Hypertension    PAD (peripheral artery disease) ([facility])    Tobacco abuse    Review of Systems:  Negative as per HPI  Physical Exam:  Vitals:   02/19/21 1445  BP: 140/88  Pulse: 93  SpO2: 100%  Weight: 117 lb 8 oz (53.3 kg)   Physical Exam Constitutional:      Appearance: Normal appearance.  HENT:     Head: Normocephalic and atraumatic.     Mouth/Throat:     Mouth: Mucous membranes are moist.     Pharynx: Oropharynx is clear.  Eyes:     Extraocular Movements: Extraocular movements intact.     Pupils: Pupils are equal, round, and reactive to light.  Cardiovascular:     Rate and Rhythm: Normal rate and regular rhythm.     Pulses: Normal pulses.  Pulmonary:     Effort: Pulmonary effort is normal.     Breath sounds: Normal breath sounds. No wheezing or rhonchi.  Abdominal:     General: Abdomen is flat. Bowel sounds are normal. There is no distension.     Palpations: Abdomen is soft.     Tenderness: There is no abdominal tenderness.  Musculoskeletal:     Cervical back: Normal range of motion and neck supple.     Right lower leg: No edema.     Left lower leg: No edema.  Skin:    General: Skin is warm and dry.     Capillary  Refill: Capillary refill takes less than 2 seconds.     Comments: Abdominal incision appears to be healing well no wound dehiscence, redness, drainage, Right foot appears more red than the left foot DP and PT pulses 2+ bilaterally and symmetric normal sensation and strength  Neurological:     General: No focal deficit present.     Mental Status: He is alert and oriented to person, place, and time.     Assessment & Plan:   See Encounters Tab for problem based charting.  Patient discussed with Dr. Heber [place]

## 2021-02-21 NOTE — Assessment & Plan Note (Signed)
Patient continues have leukocytosis and thrombocytosis on labs while in hospital this may be related to his recent surgery although this appears also to be chronic for him.  On discharge on 9/11 WBC of 29 and platelets in the 900s on discharge from the hospital.  Blood smear from admission with neutrophilic predominance.  May be related to inflammation from chronic smoking.  Although hematology hematological malignancy cannot be ruled out we will refer him to hematology for further evaluation.

## 2021-02-21 NOTE — Assessment & Plan Note (Signed)
BP today elevated at 140/88.  He was discontinued off his hydrochlorothiazide due to hypokalemia during admission for aortofemoral bypass.  Otherwise he states he is doing well and is asymptomatic.  His hyperkalemia is secondary to having NG tube placed due to postsurgical ileus.  Plan BMP with improved potassium of 3.7 Call patient to discuss lab results and have him return start his hydrochlorothiazide 12.5 mg daily. Follow-up for blood pressure in 1 month

## 2021-02-21 NOTE — Progress Notes (Signed)
Internal Medicine Clinic Attending  Case discussed with Dr. Lisabeth Devoid    At the time of the visit.  We reviewed the resident's history and exam and pertinent patient test results.  I agree with the assessment, diagnosis, and plan of care documented in the resident's note.  His chronic leukocytosis does seem more elevated that I typically see from chronic smoking, I agree it may be a good idea to see hematology for an evaluation.

## 2021-02-21 NOTE — Assessment & Plan Note (Signed)
Patient admitted on 9/1 for aortofemoral bypass due to critical limb ischemia of the right lower extremity.  Aortobifemoral bypass performed by Dr. Stanford Breed, [facility] course was complicated by postsurgical ileus requiring NG tube placement.  Patient has been feeling well since surgery states his foot is feeling much better no longer having pain.  Is able to walk small amounts without pain in the foot does note some tingling in the foot since surgery likely due to revascularization of the limb.  No longer having abdominal pain nausea or vomiting he is passing daily stools.  Reports that he is eating and drinking well since surgery.  Currently taking aspirin 81 mg daily.  Continue aspirin Encourage smoking cessation

## 2021-02-25 ENCOUNTER — Telehealth: Payer: Self-pay | Admitting: Oncology

## 2021-02-25 NOTE — Telephone Encounter (Signed)
Scheduled appt per 9/14 referral from Dr. Heber [place]. Pt is aware of appt date and time.

## 2021-02-26 ENCOUNTER — Telehealth: Payer: Self-pay

## 2021-02-26 ENCOUNTER — Inpatient Hospital Stay: Payer: BC Managed Care – PPO | Attending: Oncology | Admitting: Oncology

## 2021-02-26 ENCOUNTER — Telehealth: Payer: Self-pay | Admitting: *Deleted

## 2021-02-26 ENCOUNTER — Other Ambulatory Visit: Payer: Self-pay | Admitting: *Deleted

## 2021-02-26 ENCOUNTER — Other Ambulatory Visit: Payer: Self-pay

## 2021-02-26 ENCOUNTER — Inpatient Hospital Stay: Payer: BC Managed Care – PPO

## 2021-02-26 VITALS — BP 116/88 | HR 90 | Temp 98.1°F | Resp 16 | Ht 66.0 in | Wt 114.3 lb

## 2021-02-26 DIAGNOSIS — Z7982 Long term (current) use of aspirin: Secondary | ICD-10-CM | POA: Diagnosis not present

## 2021-02-26 DIAGNOSIS — Z7289 Other problems related to lifestyle: Secondary | ICD-10-CM | POA: Insufficient documentation

## 2021-02-26 DIAGNOSIS — Z880 Allergy status to penicillin: Secondary | ICD-10-CM | POA: Insufficient documentation

## 2021-02-26 DIAGNOSIS — I739 Peripheral vascular disease, unspecified: Secondary | ICD-10-CM | POA: Insufficient documentation

## 2021-02-26 DIAGNOSIS — D72829 Elevated white blood cell count, unspecified: Secondary | ICD-10-CM

## 2021-02-26 DIAGNOSIS — Z79899 Other long term (current) drug therapy: Secondary | ICD-10-CM | POA: Insufficient documentation

## 2021-02-26 DIAGNOSIS — Z87891 Personal history of nicotine dependence: Secondary | ICD-10-CM | POA: Insufficient documentation

## 2021-02-26 DIAGNOSIS — D75839 Thrombocytosis, unspecified: Secondary | ICD-10-CM | POA: Diagnosis present

## 2021-02-26 DIAGNOSIS — I10 Essential (primary) hypertension: Secondary | ICD-10-CM | POA: Insufficient documentation

## 2021-02-26 DIAGNOSIS — Z86718 Personal history of other venous thrombosis and embolism: Secondary | ICD-10-CM | POA: Diagnosis not present

## 2021-02-26 LAB — CBC WITH DIFFERENTIAL (CANCER CENTER ONLY)
Abs Immature Granulocytes: 1.04 10*3/uL — ABNORMAL HIGH (ref 0.00–0.07)
Basophils Absolute: 0.2 10*3/uL — ABNORMAL HIGH (ref 0.0–0.1)
Basophils Relative: 1 %
Eosinophils Absolute: 0.7 10*3/uL — ABNORMAL HIGH (ref 0.0–0.5)
Eosinophils Relative: 3 %
HCT: 36.7 % — ABNORMAL LOW (ref 39.0–52.0)
Hemoglobin: 12.4 g/dL — ABNORMAL LOW (ref 13.0–17.0)
Immature Granulocytes: 4 %
Lymphocytes Relative: 14 %
Lymphs Abs: 3.4 10*3/uL (ref 0.7–4.0)
MCH: 31.7 pg (ref 26.0–34.0)
MCHC: 33.8 g/dL (ref 30.0–36.0)
MCV: 93.9 fL (ref 80.0–100.0)
Monocytes Absolute: 1.9 10*3/uL — ABNORMAL HIGH (ref 0.1–1.0)
Monocytes Relative: 8 %
Neutro Abs: 16.9 10*3/uL — ABNORMAL HIGH (ref 1.7–7.7)
Neutrophils Relative %: 70 %
Platelet Count: 1171 10*3/uL (ref 150–400)
RBC: 3.91 MIL/uL — ABNORMAL LOW (ref 4.22–5.81)
RDW: 13.3 % (ref 11.5–15.5)
WBC Count: 24.1 10*3/uL — ABNORMAL HIGH (ref 4.0–10.5)
nRBC: 0 % (ref 0.0–0.2)

## 2021-02-26 LAB — CMP (CANCER CENTER ONLY)
ALT: 38 U/L (ref 0–44)
AST: 28 U/L (ref 15–41)
Albumin: 3.2 g/dL — ABNORMAL LOW (ref 3.5–5.0)
Alkaline Phosphatase: 92 U/L (ref 38–126)
Anion gap: 13 (ref 5–15)
BUN: 9 mg/dL (ref 6–20)
CO2: 40 mmol/L — ABNORMAL HIGH (ref 22–32)
Calcium: 9.7 mg/dL (ref 8.9–10.3)
Chloride: 86 mmol/L — ABNORMAL LOW (ref 98–111)
Creatinine: 0.84 mg/dL (ref 0.61–1.24)
GFR, Estimated: >60 mL/min (ref >60)
Glucose, Bld: 95 mg/dL (ref 70–99)
Potassium: 2.5 mmol/L — CL (ref 3.5–5.1)
Sodium: 139 mmol/L (ref 135–145)
Total Bilirubin: 0.3 mg/dL (ref 0.3–1.2)
Total Protein: 7.6 g/dL (ref 6.5–8.1)

## 2021-02-26 MED ORDER — POTASSIUM CHLORIDE CRYS ER 20 MEQ PO TBCR: 20.0000 meq | EXTENDED_RELEASE_TABLET | Freq: Every day | ORAL | 0 refills | Status: DC

## 2021-02-26 NOTE — Telephone Encounter (Signed)
CRITICAL VALUE STICKER  CRITICAL VALUE: K = 2.5  RECEIVER (on-site recipient of call): Yetta Glassman, CMA  DATE & TIME NOTIFIED: 02/26/21 at 2:57pm  MESSENGER (representative from lab): Suanne Marker  MD NOTIFIED: Alen Blew  TIME OF NOTIFICATION: 02/26/21 at 3:00pm  RESPONSE: Notification given to Katharine Look, RN for follow-up with provider and pt.

## 2021-02-26 NOTE — Progress Notes (Signed)
Reason for the request:    Leukocytosis  HPI: I was asked by Dr. Heber Royal to evaluate Brandon Lara for the evaluation of leukocytosis.  He is a 60 year old man with history of tobacco and alcohol use who underwent aortobifemoral bypass surgery on February 06, 2021.  He had presented with leg pain and found to have symptoms of claudication and imaging studies showed right inflow disease with occlusion in the right common iliac artery and the right external iliac artery.  During his hospitalizations, he was noted to have elevated white cell count as high as 29,000 and platelet count 917.  Before his hospitalization, CBC obtained on January 21, 2021 with a hemoglobin of 16.9, white cell count of 18.0 and platelet count of 601.  Differential showed 60% neutrophils otherwise normal differential.  His peripheral smear showed large platelets with a neutrophilia and lymphocytosis.  Clinically, he reports no complaints at this time.  He is recovered from his surgery without any other issues.  He denies any abdominal pain, nausea or fatigue.  He denies any constitutional symptoms.  He has been told that he has an elevated white cell count in the past.  He reports heavy smoking history for over a pack and a half for many years and recently quit.   He does not report any headaches, blurry vision, syncope or seizures. Does not report any fevers, chills or sweats.  Does not report any cough, wheezing or hemoptysis.  Does not report any chest pain, palpitation, orthopnea or leg edema.  Does not report any nausea, vomiting or abdominal pain.  Does not report any constipation or diarrhea.  Does not report any skeletal complaints.    Does not report frequency, urgency or hematuria.  Does not report any skin rashes or lesions. Does not report any heat or cold intolerance.  Does not report any lymphadenopathy or petechiae.  Does not report any anxiety or depression.  Remaining review of systems is negative.     Past Medical  History:  Diagnosis Date   Alcohol use    6-7 beers/day (as of 01/21/21)   Hypertension    PAD (peripheral artery disease) (HCC)    Tobacco abuse   :   Past Surgical History:  Procedure Laterality Date   AORTA - BILATERAL FEMORAL ARTERY BYPASS GRAFT Bilateral 02/06/2021   Procedure: AORTA BIFEMORAL BYPASS GRAFT with Omentopexy;  Surgeon: Cherre Robins, MD;  Location: [facility] OR;  Service: Vascular;  Laterality: Bilateral;   WISDOM TOOTH EXTRACTION    :   Current Outpatient Medications:    aspirin EC 81 MG tablet, Take 81 mg by mouth daily. Swallow whole., Disp: , Rfl:    gabapentin (NEURONTIN) 300 MG capsule, Take 300-600 mg by mouth See admin instructions. Take 1 capsule (300 mg) by mouth in the morning & take 2 capsules (600 mg) by mouth in the evening., Disp: , Rfl:    Multiple Vitamin (MULTIVITAMIN WITH MINERALS) TABS tablet, Take 1 tablet by mouth daily., Disp: , Rfl:    oxyCODONE-acetaminophen (PERCOCET) 5-325 MG tablet, Take 1 tablet by mouth every 6 (six) hours as needed., Disp: 20 tablet, Rfl: 0   rosuvastatin (CRESTOR) 20 MG tablet, Take 1 tablet (20 mg total) by mouth daily., Disp: 30 tablet, Rfl: 11   varenicline (CHANTIX) 1 MG tablet, Take 1 tablet (1 mg total) by mouth 2 (two) times daily., Disp: 60 tablet, Rfl: 2:   Allergies  Allergen Reactions   Penicillins Rash  :   Family History  Problem Relation Age of Onset   Kidney cancer Mother   :   Social History   Socioeconomic History   Marital status: Single    Spouse name: Not on file   Number of children: Not on file   Years of education: Not on file   Highest education level: Not on file  Occupational History   Not on file  Tobacco Use   Smoking status: Former    Packs/day: 1.00    Types: Cigarettes    Quit date: 01/22/2021    Years since quitting: 0.0   Smokeless tobacco: Never   Tobacco comments:    Currently taking chantix-as of 01/28/2021-pt had not smoked in one week.Brandon Lara, Brandon  Cassady8/23/202211:07 AM       Vaping Use   Vaping Use: Never used  Substance and Sexual Activity   Alcohol use: Yes    Alcohol/week: 49.0 standard drinks    Types: 49 Cans of beer per week    Comment: 6-7 beers a day (01/21/21)   Drug use: Never   Sexual activity: Not on file  Other Topics Concern   Not on file  Social History Narrative   Not on file   Social Determinants of Health   Financial Resource Strain: Not on file  Food Insecurity: Not on file  Transportation Needs: Not on file  Physical Activity: Not on file  Stress: Not on file  Social Connections: Not on file  Intimate Partner Violence: Not on file  :    Exam: Blood pressure 116/88, pulse 90, temperature 98.1 F (36.7 C), temperature source Oral, resp. rate 16, height 5\' 6"  (1.676 m), weight 114 lb 4.8 oz (51.8 kg), SpO2 99 %. ECOG 1 General appearance: alert and cooperative appeared without distress. Head: atraumatic without any abnormalities. Eyes: conjunctivae/corneas clear. PERRL.  Sclera anicteric. Throat: lips, mucosa, and tongue normal; without oral thrush or ulcers. Resp: clear to auscultation bilaterally without rhonchi, wheezes or dullness to percussion. Cardio: regular rate and rhythm, S1, S2 normal, no murmur, click, rub or gallop GI: soft, non-tender; bowel sounds normal; no masses,  no organomegaly Skin: Skin color, texture, turgor normal. No rashes or lesions Lymph nodes: Cervical, supraclavicular, and axillary nodes normal. Neurologic: Grossly normal without any motor, sensory or deep tendon reflexes. Musculoskeletal: No joint deformity or effusion.  Assessment and Plan:   60 year old with:  1.  Leukocytosis with thrombocytosis noted and August 2022 although no previous counts are available which is likely more chronic than that.  Differential showed neutrophilia with peripheral smear showed thrombocytosis with exercise and shape variation platelets.  The differential diagnosis of these  findings were reviewed at this time.  Myeloproliferative disorder is certainly a possibility.  Could be in the form of essential thrombocythemia, chronic myelogenous leukemia, marrow fibrosis is less likely.  Reactive leukocytosis related to smoking and secondary thrombocytosis could also be a possibility.  To evaluate this full we will obtain repeat CBC today with JAK2 mutation as well as a myeloproliferative panel.  BCR/ABL also will be obtained by FISH to rule out chronic myelogenous leukemia.  From a management standpoint, depending on the findings will dictate treatments.  Hydroxyurea could be a reasonable option to control his platelet and white cell count given his risk of thrombosis.  Phlebotomy could be used if he has erythrocytosis.  Oral targeted therapy for CML could also be used if this is the diagnosis which is considered less likely at this point but still a consideration.  2.  Thrombosis prophylaxis:  He is currently on aspirin I recommended continuing.  3.  Follow-up: In the next few weeks after completing his work-up.   45  minutes were dedicated to this visit. The time was spent on reviewing laboratory data, discussing treatment options, discussing differential diagnosis and answering questions regarding future plan.     A copy of this consult has been forwarded to the requesting physician.

## 2021-02-26 NOTE — Telephone Encounter (Signed)
CRITICAL VALUE STICKER  CRITICAL VALUE: PLT = 1171  RECEIVER (on-site recipient of call): Yetta Glassman, [place] NOTIFIED: 02/26/21 at 2:31pm  MESSENGER (representative from lab): Ulice Dash  MD NOTIFIED: [place]: 02/26/21 at 2:31pm  RESPONSE: Notification given directly to Dr. Alen Blew for follow-up with the pt.

## 2021-02-26 NOTE — Progress Notes (Signed)
Prescription for K-dur 20 daily for 14 days sent to pharmacy per Dr. Hazeline Junker orders.

## 2021-02-26 NOTE — Telephone Encounter (Signed)
Contacted patient with Dr. Hazeline Junker message as below. Informed him that prescription was sent to his pharmacy of record. Patient verbalized understanding.  Advised patient that a list of potassium content of foods has been sent via mychart for his information as well.

## 2021-02-26 NOTE — Telephone Encounter (Signed)
-----   Message from Wyatt Portela, MD sent at 02/26/2021  2:59 PM EDT ----- Please send a prescription for K-dur 20 daily for 14 days.  Let the patient know this is to replace his potassium which was low today.

## 2021-02-28 ENCOUNTER — Telehealth: Payer: Self-pay | Admitting: Oncology

## 2021-02-28 NOTE — Telephone Encounter (Signed)
Scheduled per 09/21 los, patient has been called and notified of upcoming appointments. 

## 2021-03-03 ENCOUNTER — Telehealth: Payer: Self-pay | Admitting: *Deleted

## 2021-03-03 NOTE — Telephone Encounter (Signed)
Patient called concerned about the potassium that was ordered for him.  He stated that he started taking it and since has developed constipation.  Last Bowel Movement >3 days ago.  He states he has tried taking 1 tablet of exlax without any improvement.  He reports additional concern for continuing the potassium because the information insert states that use caution if history of slow bowels or blockage.  He was recently admitted in hospital for bowel blockage at beginning of September.  Denies nausea/vomiting/bloating.  Reports discomfort with constipation.  Routing to MD to please advise if he can do anything else at home for the constipation and how he should proceed with the potassium

## 2021-03-03 NOTE — Progress Notes (Deleted)
[facility] [place] PROGRESS NOTE  ASSESSMENT / PLAN: Brandon Lara is a 60 y.o. male status post aortobifemoral bypass on 02/06/21. He has done well. ABI shows ***.    SUBJECTIVE: ***  OBJECTIVE: There were no vitals taken for this visit. @INTAKEOUTPUTBRIEF @  Urine output over past 24 hours: ***  Constitutional: *** appearing. *** acute distress. CNS: *** Cardiac: ***. Pulmonary: *** Abdomen: *** Vascular: ***  CBC Latest Ref Rng & Units 02/26/2021 02/16/2021 02/15/2021  WBC 4.0 - 10.5 K/uL 24.1(H) 29.4(H) 28.4(H)  Hemoglobin 13.0 - 17.0 g/dL 12.4(L) 11.2(L) 11.4(L)  Hematocrit 39.0 - 52.0 % 36.7(L) 34.0(L) 33.6(L)  Platelets 150 - 400 K/uL 1,171(HH) 917(HH) 825(H)     CMP Latest Ref Rng & Units 02/26/2021 02/19/2021 02/16/2021  Glucose 70 - 99 mg/dL 95 82 104(H)  BUN 6 - 20 mg/dL 9 7 8   Creatinine 0.61 - 1.24 mg/dL 0.84 0.66(L) 0.84  Sodium 135 - 145 mmol/L 139 133(L) 134(L)  Potassium 3.5 - 5.1 mmol/L 2.5(LL) 3.7 2.9(L)  Chloride 98 - 111 mmol/L 86(L) 91(L) 94(L)  CO2 22 - 32 mmol/L 40(H) 26 29  Calcium 8.9 - 10.3 mg/dL 9.7 8.4(L) 8.0(L)  Total Protein 6.5 - 8.1 g/dL 7.6 - -  Total Bilirubin 0.3 - 1.2 mg/dL 0.3 - -  Alkaline Phos 38 - 126 U/L 92 - -  AST 15 - 41 U/L 28 - -  ALT 0 - 44 U/L 38 - -    Estimated Creatinine Clearance: 69.4 mL/min (by C-G formula based on SCr of 0.84 mg/dL).  ***  Yevonne Aline. Stanford Breed, MD [facility] Phone Number: [phone] 03/03/2021 4:32 PM

## 2021-03-04 ENCOUNTER — Encounter (HOSPITAL_COMMUNITY): Payer: BC Managed Care – PPO

## 2021-03-04 ENCOUNTER — Encounter: Payer: BC Managed Care – PPO | Admitting: Vascular Surgery

## 2021-03-04 ENCOUNTER — Telehealth: Payer: Self-pay

## 2021-03-04 NOTE — Telephone Encounter (Signed)
Patient called saying he has an appt this afternoon with [facility]. He has had very bad constipation x 2days.  He is taking potassium and thinks that may be the culprit.  He says another Dr. told him to quit taking it.  Pt says he will try to keep his appointment today and will discuss this further with Dr. Stanford Breed.

## 2021-03-05 ENCOUNTER — Other Ambulatory Visit: Payer: Self-pay | Admitting: Oncology

## 2021-03-05 DIAGNOSIS — D72829 Elevated white blood cell count, unspecified: Secondary | ICD-10-CM

## 2021-03-05 LAB — JAK2 (INCLUDING V617F AND EXON 12), MPL,& CALR-NEXT GEN SEQ

## 2021-03-05 LAB — BCR ABL1 FISH (GENPATH)

## 2021-03-05 MED ORDER — HYDROXYUREA 500 MG PO CAPS: 500.0000 mg | ORAL_CAPSULE | Freq: Every day | ORAL | 3 refills | Status: DC

## 2021-03-05 NOTE — Progress Notes (Signed)
The results of his laboratory testing were reviewed and discussed today with the patient via phone.  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  Risks and benefits of treating him with hydroxyurea were discussed at this time.  Given his elevated platelet count risk of thrombosis I would favor of starting hydroxyurea 500 mg daily and titrating up or better count control.  This was discussed with him in detail and complications that include nausea, fatigue, dermatological toxicity among others were reiterated.  After discussion he is agreeable to proceed at this time.  We will check his CBC in 3 weeks upon his return.

## 2021-03-06 ENCOUNTER — Telehealth: Payer: Self-pay

## 2021-03-06 NOTE — Telephone Encounter (Signed)
Spoke with Patient regarding Prior Authorization request for Hydroxyurea. [employer] responded via fax for authorization request. Medication does not require a Prior Authorization under members plan, but it must be filled through a specialty pharmacy. Patient stated that he paid for the medication because it was only thirty five dollars.Offered to contact [employer] for Patient to find out which specialty pharmacy to have the prescription sent to. Patient declined at this time. Encouraged Patient to have medication filled through the specialty pharmacy. Patient stated that if he needs to continue the medication that he will let Provider or Nurse know at that time. No other needs verbalized at this time.

## 2021-03-17 NOTE — Progress Notes (Signed)
[facility] [place]  ASSESSMENT / PLAN: Brandon Lara is a 60 y.o. 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  His ABI has normalized.  Recommend the following which can slow the progression of atherosclerosis and reduce the risk of major adverse cardiac / limb events:  Complete cessation from all tobacco products. Blood glucose control with goal A1c < 7%. Blood pressure control with goal blood pressure < 140/90 mmHg. Lipid reduction therapy with goal LDL-C <100 mg/dL (<70 if symptomatic from PAD).  Aspirin 81mg  PO QD.  Atorvastatin 40-80mg  PO QD (or other "high intensity" statin therapy).  Follow up in 1 year with repeat ABI to monitor PAD.  I encouraged him to supplement his diet with high density protein shakes.  Noted ongoing workup for leukocytosis and appreciate IM and heme/onc teams.  He is cleared to return to work.  CHIEF COMPLAINT: Bilateral leg pain  HISTORY OF PRESENT ILLNESS: Brandon Lara is a 60 y.o. male referred to clinic for evaluation of severe right lower extremity pain at rest.  He was seen by Dr. Gladstone Lighter with orthopedics who identified pulseless right leg and referred him for urgent evaluation.  The patient reports progressive symptoms of intermittent claudication which have worsened over the past several months.  He now has pain at rest.  The right leg is worse.  He has no ulceration about his lower extremities.  He is a longtime smoker.  He is otherwise healthy to the best of his knowledge.  03/18/21: Patient returns to clinic for postoperative evaluation he is doing well.  He has intermittent constipation.  He feels this is because of potassium limitation.  He continues to follow-up with internal medicine and hematology for his leukocytosis.  His feet feel improved.  He still has occasional numbness and paresthesia in his right foot, but this seems  to be improving.  He is deconditioned.  His appetite has been diminished.  He has lost weight.  Globally, however, he feels improved.  VASCULAR SURGICAL HISTORY:  Aorto-bi-femoral bypass with 18x74mm Dacron graft. Omentopexy performed to cover retroperitoneum.   VASCULAR RISK FACTORS: Negative history of cerebrovascular disease / stroke / transient ischemic attack. Negative history of coronary artery disease.  Negative history of diabetes mellitus.  Positive history of smoking.  He has quit smoking. Negative history of hypertension.  Negative history of chronic kidney disease. Negative history of chronic obstructive pulmonary disease.  AMBULATORY STATUS: Ambulatory within the community without limits before current symptoms  Past medical history: denies Past surgical history: denies Family history: denies Social history: active smoker as above. Works as Chief Financial Officer. Medications: none Allergies: NKDA  REVIEW OF SYSTEMS:  [X]  denotes positive finding, [ ]  denotes negative finding Cardiac  Comments:  Chest pain or chest pressure:    Shortness of breath upon exertion:    Short of breath when lying flat:    Irregular heart rhythm:        Vascular    Pain in calf, thigh, or hip brought on by ambulation:    Pain in feet at night that wakes you up from your sleep:     Blood clot in your veins:    Leg swelling:         Pulmonary    Oxygen at home:    Productive cough:     Wheezing:         Neurologic    Sudden weakness in arms  or legs:     Sudden numbness in arms or legs:     Sudden onset of difficulty speaking or slurred speech:    Temporary loss of vision in one eye:     Problems with dizziness:         Gastrointestinal    Blood in stool:     Vomited blood:         Genitourinary    Burning when urinating:     Blood in urine:        Psychiatric    Major depression:         Hematologic    Bleeding problems:    Problems with blood clotting too easily:         Skin    Rashes or ulcers:        Constitutional    Fever or chills:      PHYSICAL EXAM Vitals:   03/18/21 0948  BP: 128/62  Pulse: 82  Resp: 20  Temp: 98.5 F (36.9 C)  SpO2: 98%  Weight: 116 lb 12.8 oz (53 kg)  Height: 5\' 6"  (1.676 m)    Constitutional: well appearing. no distress. Appears marginally nourished.  Neurologic: CN intact. no focal findings. no sensory loss. Psychiatric:  Mood and affect symmetric and appropriate. Eyes:  No icterus. No conjunctival pallor. Ears, nose, throat:  mucous membranes moist. Midline trachea.  Cardiac: regular rate and rhythm.  Respiratory:  unlabored. Abdominal:  soft, non-tender, non-distended.  Well-healed midline laparotomy.  No evidence of hernia. Peripheral vascular: 2+ femoral pulses.  Groin incisions healed well.  2+ dorsalis pedis pulses. Extremity: no edema. no cyanosis. no pallor.  Skin: no gangrene. no ulceration.  Lymphatic: no Stemmer's sign. no palpable lymphadenopathy.  PERTINENT LABORATORY AND RADIOLOGIC DATA +-------+-----------+-----------+------------+------------+  ABI/TBIToday's ABIToday's TBIPrevious ABIPrevious TBI  +-------+-----------+-----------+------------+------------+  Right  1.05       0.86       0.20        absent        +-------+-----------+-----------+------------+------------+  Left   1.22       0.92       0.84        0.74          +-------+-----------+-----------+------------+------------+   Yevonne Aline. Stanford Breed, MD [facility] Phone Number: [phone] 03/18/2021 10:16 AM

## 2021-03-18 ENCOUNTER — Ambulatory Visit (INDEPENDENT_AMBULATORY_CARE_PROVIDER_SITE_OTHER): Payer: BC Managed Care – PPO | Admitting: Vascular Surgery

## 2021-03-18 ENCOUNTER — Other Ambulatory Visit: Payer: Self-pay

## 2021-03-18 ENCOUNTER — Encounter: Payer: Self-pay | Admitting: Vascular Surgery

## 2021-03-18 ENCOUNTER — Ambulatory Visit (HOSPITAL_COMMUNITY)
Admission: RE | Admit: 2021-03-18 | Discharge: 2021-03-18 | Disposition: A | Discharge status: Home / Self Care | Payer: BC Managed Care – PPO | Source: Ambulatory Visit | Attending: Vascular Surgery | Admitting: Vascular Surgery

## 2021-03-18 VITALS — BP 128/62 | HR 82 | Temp 98.5°F | Resp 20 | Ht 66.0 in | Wt 116.8 lb

## 2021-03-18 DIAGNOSIS — I739 Peripheral vascular disease, unspecified: Secondary | ICD-10-CM

## 2021-03-19 ENCOUNTER — Other Ambulatory Visit: Payer: Self-pay

## 2021-03-19 ENCOUNTER — Ambulatory Visit (INDEPENDENT_AMBULATORY_CARE_PROVIDER_SITE_OTHER): Payer: BC Managed Care – PPO | Admitting: Student

## 2021-03-19 ENCOUNTER — Encounter: Payer: Self-pay | Admitting: Student

## 2021-03-19 DIAGNOSIS — F17201 Nicotine dependence, unspecified, in remission: Secondary | ICD-10-CM

## 2021-03-19 DIAGNOSIS — I10 Essential (primary) hypertension: Secondary | ICD-10-CM | POA: Diagnosis not present

## 2021-03-19 MED ORDER — HYDROCHLOROTHIAZIDE 12.5 MG PO CAPS: 12.5000 mg | ORAL_CAPSULE | Freq: Every day | ORAL | 11 refills | Status: DC

## 2021-03-19 MED ORDER — HYDROCHLOROTHIAZIDE 12.5 MG PO CAPS: 12.5000 mg | ORAL_CAPSULE | Freq: Every day | ORAL | 2 refills | Status: DC

## 2021-03-19 NOTE — Assessment & Plan Note (Signed)
Patient here for blood pressure follow-up.  He was restarted back on his HCTZ with improvement in his potassium.  BP has improved to 116/74 today. Potassium recently dropped to 2.5 and he was started on potassium supplementation by his oncologist. This was discontinued a few days later due to complaints of constipation.  Plan: -- Continue HCTZ 12.5 mg daily -- Oncology lab visit on 10/14 -- Patient plans to establish care at [facility]

## 2021-03-19 NOTE — Assessment & Plan Note (Signed)
Patient states he has abstained from cigarettes since discharge from the hospital. Patient was congratulated and advised to reach out to his provider for resources if he starts having cravings.

## 2021-03-19 NOTE — Patient Instructions (Addendum)
Thank you, [name] for allowing [place] to provide your care today. Today we discussed your blood pressure and constipation.     My Chart Access: [email or web address]  Please follow-up as needed  Please make sure to arrive 15 minutes prior to your next appointment. If you arrive late, you may be asked to reschedule.    We look forward to seeing you next time. Please call our clinic at [phone] if you have any questions or concerns. The best time to call is Monday-Friday from 9am-4pm, but there is someone available 24/7. If after hours or the weekend, call the main hospital number and ask for the Internal Medicine Resident On-Call. If you need medication refills, please notify your pharmacy one week in advance and they will send [place] a request.   Thank you for letting us take part in your care. Wishing you the best!  Lacinda Axon, MD 03/19/2021, 4:08 PM IM Resident, PGY-2 Oswaldo Milian 41:10

## 2021-03-19 NOTE — Progress Notes (Signed)
   CC: Blood pressure follow-up  HPI:  [name] is a 60 y.o. male with PMH as below who presents to clinic for follow-up on his blood pressure. Please see problem based charting for evaluation, assessment and plan.  Past Medical History:  Diagnosis Date   Alcohol use    6-7 beers/day (as of 01/21/21)   Hypertension    PAD (peripheral artery disease) (HCC)    Tobacco abuse     Review of Systems:  Constitutional: Negative for fever or fatigue Eyes: Negative for visual changes Respiratory: Negative for shortness of breath Cardiac: Negative for chest pain MSK: Negative for back pain Abdomen: Negative for abdominal pain, N/V and diarrhea. Positive for constipation. Neuro: Negative for headache or weakness  Physical Exam: General: Pleasant, mildly cachectic elderly male. No acute distress. Cardiac: RRR. No murmurs, rubs or gallops. No LE edema Respiratory: Lungs CTAB. No wheezing or crackles. Abdominal: Soft, non distended and non tender. Healing abdominal incision. Normal BS. Extremities: Atraumatic. Full ROM. Palpable radial and DP pulses. Neuro: A&O x 3.  Normal sensation in all extremities.  Moves all extremities Psych: Appropriate mood and affect.  Vitals:   03/19/21 1521  BP: 116/74  Pulse: 80  Temp: 98 F (36.7 C)  TempSrc: Oral  SpO2: 100%  Weight: 117 lb 3.2 oz (53.2 kg)  Height: 5\' 6"  (1.676 m)    Assessment & Plan:   See Encounters Tab for problem based charting.  Patient discussed with Dr. Lockie Pares, MD, MPH

## 2021-03-20 NOTE — Progress Notes (Signed)
Internal Medicine Clinic Attending  Case discussed with Dr. [place]  At the time of the visit.  We reviewed the resident's history and exam and pertinent patient test results.  I agree with the assessment, diagnosis, and plan of care documented in the resident's note.  

## 2021-03-21 ENCOUNTER — Inpatient Hospital Stay: Payer: BC Managed Care – PPO

## 2021-03-21 ENCOUNTER — Other Ambulatory Visit: Payer: Self-pay

## 2021-03-21 ENCOUNTER — Inpatient Hospital Stay: Payer: BC Managed Care – PPO | Attending: Oncology | Admitting: Oncology

## 2021-03-21 VITALS — BP 94/71 | HR 89 | Temp 97.8°F | Resp 16 | Ht 66.0 in | Wt 116.1 lb

## 2021-03-21 DIAGNOSIS — Z880 Allergy status to penicillin: Secondary | ICD-10-CM | POA: Diagnosis not present

## 2021-03-21 DIAGNOSIS — Z7982 Long term (current) use of aspirin: Secondary | ICD-10-CM | POA: Insufficient documentation

## 2021-03-21 DIAGNOSIS — D75839 Thrombocytosis, unspecified: Secondary | ICD-10-CM | POA: Insufficient documentation

## 2021-03-21 DIAGNOSIS — D72829 Elevated white blood cell count, unspecified: Secondary | ICD-10-CM

## 2021-03-21 DIAGNOSIS — D471 Chronic myeloproliferative disease: Secondary | ICD-10-CM | POA: Diagnosis present

## 2021-03-21 DIAGNOSIS — K5900 Constipation, unspecified: Secondary | ICD-10-CM | POA: Diagnosis not present

## 2021-03-21 LAB — CBC WITH DIFFERENTIAL (CANCER CENTER ONLY)
Abs Immature Granulocytes: 0.22 10*3/uL — ABNORMAL HIGH (ref 0.00–0.07)
Basophils Absolute: 0.1 10*3/uL (ref 0.0–0.1)
Basophils Relative: 1 %
Eosinophils Absolute: 0.6 10*3/uL — ABNORMAL HIGH (ref 0.0–0.5)
Eosinophils Relative: 3 %
HCT: 34.7 % — ABNORMAL LOW (ref 39.0–52.0)
Hemoglobin: 11.2 g/dL — ABNORMAL LOW (ref 13.0–17.0)
Immature Granulocytes: 1 %
Lymphocytes Relative: 16 %
Lymphs Abs: 3 10*3/uL (ref 0.7–4.0)
MCH: 30.7 pg (ref 26.0–34.0)
MCHC: 32.3 g/dL (ref 30.0–36.0)
MCV: 95.1 fL (ref 80.0–100.0)
Monocytes Absolute: 1.4 10*3/uL — ABNORMAL HIGH (ref 0.1–1.0)
Monocytes Relative: 8 %
Neutro Abs: 13.3 10*3/uL — ABNORMAL HIGH (ref 1.7–7.7)
Neutrophils Relative %: 71 %
Platelet Count: 799 10*3/uL — ABNORMAL HIGH (ref 150–400)
RBC: 3.65 MIL/uL — ABNORMAL LOW (ref 4.22–5.81)
RDW: 14.4 % (ref 11.5–15.5)
WBC Count: 18.7 10*3/uL — ABNORMAL HIGH (ref 4.0–10.5)
nRBC: 0 % (ref 0.0–0.2)

## 2021-03-21 MED ORDER — POLYETHYLENE GLYCOL 3350 17 G PO PACK: 17.0000 g | PACK | Freq: Every day | ORAL | 2 refills | Status: DC

## 2021-03-21 NOTE — Progress Notes (Signed)
Hematology and Oncology Follow Up Visit  Brandon Lara [id number] 30-Nov-1960 60 y.o. 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     Current therapy: Hydroxyurea 500 mg daily started in September 2022.  Interim History: [name] returns today for a follow-up visit.  Since the last visit, he reports of feeling overall well without any major complaints.  He took hydroxyurea for the last 3 weeks although stopped it for the last few days after complaints of constipation.  He reports taking a laxative which have helped his symptoms.  He had denies any nausea, vomiting or abdominal pain.  He has been released to go back to work.     Medications: I have reviewed the patient's current medications.  Current Outpatient Medications  Medication Sig Dispense Refill   aspirin EC 81 MG tablet Take 81 mg by mouth daily. Swallow whole.     gabapentin (NEURONTIN) 300 MG capsule Take 300-600 mg by mouth See admin instructions. Take 1 capsule (300 mg) by mouth in the morning & take 2 capsules (600 mg) by mouth in the evening.     hydrochlorothiazide (MICROZIDE) 12.5 MG capsule Take 1 capsule (12.5 mg total) by mouth daily. 30 capsule 2   hydroxyurea (HYDREA) 500 MG capsule Take 1 capsule (500 mg total) by mouth daily. May take with food to minimize GI side effects. 60 capsule 3   Multiple Vitamin (MULTIVITAMIN WITH MINERALS) TABS tablet Take 1 tablet by mouth daily.     rosuvastatin (CRESTOR) 20 MG tablet Take 1 tablet (20 mg total) by mouth daily. 30 tablet 11   varenicline (CHANTIX) 1 MG tablet Take 1 tablet (1 mg total) by mouth 2 (two) times daily. 60 tablet 2   No current facility-administered medications for this visit.     Allergies:  Allergies  Allergen Reactions   Penicillins Rash       Physical Exam: Blood pressure 94/71, pulse 89, temperature 97.8 F (36.6 C), temperature source Oral, resp. rate 16, height 5\' 6"  (1.676 m), weight 116 lb 1.6 oz (52.7 kg), SpO2 100 %.  ECOG:  1   General appearance: Alert, awake without any distress. Head: Atraumatic without abnormalities Oropharynx: Without any thrush or ulcers. Eyes: No scleral icterus. Lymph nodes: No lymphadenopathy noted in the cervical, supraclavicular, or axillary nodes Heart:regular rate and rhythm, without any murmurs or gallops.   Lung: Clear to auscultation without any rhonchi, wheezes or dullness to percussion. Abdomin: Soft, nontender without any shifting dullness or ascites. Musculoskeletal: No clubbing or cyanosis. Neurological: No motor or sensory deficits. Skin: No rashes or lesions.     Lab Results: Lab Results  Component Value Date   WBC 24.1 (H) 02/26/2021   HGB 12.4 (L) 02/26/2021   HCT 36.7 (L) 02/26/2021   MCV 93.9 02/26/2021   PLT 1,171 (HH) 02/26/2021     Chemistry      Component Value Date/Time   NA 139 02/26/2021 1420   NA 133 (L) 02/19/2021 1542   K 2.5 (LL) 02/26/2021 1420   CL 86 (L) 02/26/2021 1420   CO2 40 (H) 02/26/2021 1420   BUN 9 02/26/2021 1420   BUN 7 02/19/2021 1542   CREATININE 0.84 02/26/2021 1420      Component Value Date/Time   CALCIUM 9.7 02/26/2021 1420   ALKPHOS 92 02/26/2021 1420   AST 28  02/26/2021 1420   ALT 38 02/26/2021 1420   BILITOT 0.3 02/26/2021 1420         Impression and Plan:   60 year old with:  1.  Essential thrombocythemia noted in September 2022.  He was found to have JAK2 mutation with elevated platelets and leukocytosis.  His laboratory data were reviewed at today and showed an excellent response since platelet count and white cell count.  Risks and benefits of continuing hydroxyurea were discussed at this time.  Complications that include GI toxicity, skin rash among others were reviewed.  He is agreeable to  continue concerned about constipation symptoms which I addressed with him.  Alternative treatment options including anagrelide was reviewed but also could be problematic due to side effects.  After discussion today he is agreeable to continue to take it and we will assess him in the next 4 weeks.   2.  Thrombosis prophylaxis: He is currently on aspirin I recommended continuing.  3.  Constipation: We have discussed strategies to improve that with daily stool softeners and using laxative if he does not have a regular bowel movements in 2 to 3 days.   4.  Follow-up: In 4 weeks for repeat follow-up.     30 minutes were spent on this encounter.  Time was dedicated to reviewing laboratory data, disease status update and outlining future plan of care.    Zola Button, MD 10/14/20223:08 PM

## 2021-03-24 ENCOUNTER — Telehealth: Payer: Self-pay | Admitting: Oncology

## 2021-03-24 NOTE — Telephone Encounter (Signed)
Scheduled per 10/14 los, pt has been called and confirmed

## 2021-04-24 ENCOUNTER — Inpatient Hospital Stay (HOSPITAL_BASED_OUTPATIENT_CLINIC_OR_DEPARTMENT_OTHER): Payer: BC Managed Care – PPO | Admitting: Oncology

## 2021-04-24 ENCOUNTER — Other Ambulatory Visit: Payer: Self-pay

## 2021-04-24 ENCOUNTER — Inpatient Hospital Stay: Payer: BC Managed Care – PPO | Attending: Oncology

## 2021-04-24 VITALS — BP 111/79 | HR 89 | Temp 98.7°F | Resp 18 | Ht 66.0 in | Wt 129.4 lb

## 2021-04-24 DIAGNOSIS — D75839 Thrombocytosis, unspecified: Secondary | ICD-10-CM | POA: Insufficient documentation

## 2021-04-24 DIAGNOSIS — Z79899 Other long term (current) drug therapy: Secondary | ICD-10-CM | POA: Diagnosis not present

## 2021-04-24 DIAGNOSIS — K5900 Constipation, unspecified: Secondary | ICD-10-CM | POA: Insufficient documentation

## 2021-04-24 DIAGNOSIS — D473 Essential (hemorrhagic) thrombocythemia: Secondary | ICD-10-CM | POA: Diagnosis not present

## 2021-04-24 DIAGNOSIS — D471 Chronic myeloproliferative disease: Secondary | ICD-10-CM | POA: Diagnosis present

## 2021-04-24 DIAGNOSIS — D72829 Elevated white blood cell count, unspecified: Secondary | ICD-10-CM | POA: Diagnosis not present

## 2021-04-24 DIAGNOSIS — Z880 Allergy status to penicillin: Secondary | ICD-10-CM | POA: Diagnosis not present

## 2021-04-24 LAB — CBC WITH DIFFERENTIAL (CANCER CENTER ONLY)
Abs Immature Granulocytes: 0.41 10*3/uL — ABNORMAL HIGH (ref 0.00–0.07)
Basophils Absolute: 0.2 10*3/uL — ABNORMAL HIGH (ref 0.0–0.1)
Basophils Relative: 1 %
Eosinophils Absolute: 0.7 10*3/uL — ABNORMAL HIGH (ref 0.0–0.5)
Eosinophils Relative: 3 %
HCT: 35.6 % — ABNORMAL LOW (ref 39.0–52.0)
Hemoglobin: 11.5 g/dL — ABNORMAL LOW (ref 13.0–17.0)
Immature Granulocytes: 2 %
Lymphocytes Relative: 19 %
Lymphs Abs: 4 10*3/uL (ref 0.7–4.0)
MCH: 31.3 pg (ref 26.0–34.0)
MCHC: 32.3 g/dL (ref 30.0–36.0)
MCV: 96.7 fL (ref 80.0–100.0)
Monocytes Absolute: 2.1 10*3/uL — ABNORMAL HIGH (ref 0.1–1.0)
Monocytes Relative: 10 %
Neutro Abs: 14.1 10*3/uL — ABNORMAL HIGH (ref 1.7–7.7)
Neutrophils Relative %: 65 %
Platelet Count: 890 10*3/uL — ABNORMAL HIGH (ref 150–400)
RBC: 3.68 MIL/uL — ABNORMAL LOW (ref 4.22–5.81)
RDW: 16.4 % — ABNORMAL HIGH (ref 11.5–15.5)
WBC Count: 21.4 10*3/uL — ABNORMAL HIGH (ref 4.0–10.5)
nRBC: 0 % (ref 0.0–0.2)

## 2021-04-24 NOTE — Progress Notes (Signed)
Hematology and Oncology Follow Up Visit  [name] [id number] 30-Oct-1960 60 y.o. 04/24/2021 3:22 PM Brandon Lara, Brandon Lara, Brandon Kitchen, MD   Principle Diagnosis: 60 year old with essential thrombocythemia diagnosed in September 2020.  He presented with elevated platelet count and found to have JAK2 positive myeloproliferative disorder.     Current therapy: Hydroxyurea 500 mg daily started in September 2022.  Interim History: [name] is here for return evaluation.  Since the last visit, he reports no major changes in his health.  He has tolerated with hydroxyurea without any complaints.  He denies any nausea, vomiting or abdominal pain.  He denies any constipation or worsening diarrhea.    Medications: Updated on review. Current Outpatient Medications  Medication Sig Dispense Refill   aspirin EC 81 MG tablet Take 81 mg by mouth daily. Swallow whole.     gabapentin (NEURONTIN) 300 MG capsule Take 300-600 mg by mouth See admin instructions. Take 1 capsule (300 mg) by mouth in the morning & take 2 capsules (600 mg) by mouth in the evening.     hydrochlorothiazide (MICROZIDE) 12.5 MG capsule Take 1 capsule (12.5 mg total) by mouth daily. 30 capsule 2   hydroxyurea (HYDREA) 500 MG capsule Take 1 capsule (500 mg total) by mouth daily. May take with food to minimize GI side effects. 60 capsule 3   Multiple Vitamin (MULTIVITAMIN WITH MINERALS) TABS tablet Take 1 tablet by mouth daily.     polyethylene glycol (MIRALAX) 17 g packet Take 17 g by mouth daily. 30 each 2   rosuvastatin (CRESTOR) 20 MG tablet Take 1 tablet (20 mg total) by mouth daily. 30 tablet 11   No current facility-administered medications for this visit.     Allergies:  Allergies  Allergen Reactions   Penicillins Rash      Physical Exam:  Blood pressure 111/79, pulse 89, temperature 98.7 F (37.1 C), temperature source Temporal, resp. rate 18, height 5\' 6"  (1.676 m), weight 129 lb 6.4 oz (58.7 kg), SpO2 100  %.  ECOG:  1    General appearance: Comfortable appearing without any discomfort Head: Normocephalic without any trauma Oropharynx: Mucous membranes are moist and pink without any thrush or ulcers. Eyes: Pupils are equal and round reactive to light. Lymph nodes: No cervical, supraclavicular, inguinal or axillary lymphadenopathy.   Heart:regular rate and rhythm.  S1 and S2 without leg edema. Lung: Clear without any rhonchi or wheezes.  No dullness to percussion. Abdomin: Soft, nontender, nondistended with good bowel sounds.  No hepatosplenomegaly. Musculoskeletal: No joint deformity or effusion.  Full range of motion noted. Neurological: No deficits noted on motor, sensory and deep tendon reflex exam. Skin: No petechial rash or dryness.  Appeared moist.      Lab Results: Lab Results  Component Value Date   WBC 18.7 (H) 03/21/2021   HGB 11.2 (L) 03/21/2021   HCT 34.7 (L) 03/21/2021   MCV 95.1 03/21/2021   PLT 799 (H) 03/21/2021     Chemistry      Component Value Date/Time   NA 139 02/26/2021 1420   NA 133 (L) 02/19/2021 1542   K 2.5 (LL) 02/26/2021 1420   CL 86 (L) 02/26/2021 1420   CO2 40 (H) 02/26/2021 1420   BUN 9 02/26/2021 1420   BUN 7 02/19/2021 1542   CREATININE 0.84 02/26/2021 1420      Component Value Date/Time   CALCIUM 9.7 02/26/2021 1420   ALKPHOS 92 02/26/2021 1420   AST 28 02/26/2021 1420   ALT 38  02/26/2021 1420   BILITOT 0.3 02/26/2021 1420         Impression and Plan:   60 year old with:  1.  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  He was found to have essential thrombocythemia.  Laboratory data from today reviewed which showed platelet count to be stable although remain elevated.  Risks and benefits of continuing hydroxyurea were discussed at this time.  Increasing the dose of hydroxyurea is recommended at this time.  I asked him to continue his 500 mg daily and increase to 1000 mg in 3 days for 7 days.  He  is agreeable to do so I will reevaluate him in 4 to 6 weeks.    2.  Thrombosis prophylaxis: He is currently on aspirin I recommended continuing.  3.  Constipation: We have discussed strategies to improve that with daily stool softeners and using laxative if he does not have a regular bowel movements in 2 to 3 days.   4.  Follow-up: In 4 to 6 weeks for repeat laboratory testing.     30 minutes were dedicated to this visit.  The time was spent on reviewing laboratory data, disease status update, treatment choices and future plan of care discussion.    Zola Button, MD 11/17/20223:22 PM

## 2021-05-11 NOTE — Progress Notes (Signed)
Cardiology Office Note:    Date:  05/12/2021   ID:  Brandon Lara, DOB April 24, 1961, MRN [id number]  PCP:  [name], MD   [place] Prov[facility] Cardiologist:  Werner Lean, MD     Referring MD: No ref. provider found   CC: F/u   History of Present Illness:    Brandon Lara is a 60 y.o. male with a hx of PAD planned for aortofemoral bypass, Active tobacco use who presents for evaluation 01/09/21.  In interim of this visit, Had high BP that were discrepant from our eval, patient had surgery without cardiac issues, has since has onc evals for leukocytosis and thrombocytosis.  Seen 05/12/21.  Patient notes that he is doing well otherwise.    Since last visit notes residual leg numbness There are no interval hospital/ED visit outside of surgery.   No chest pain or pressure .  No SOB/DOE and no PND/Orthopnea.  No weight gain or leg swelling.  No palpitations or syncope.  Quit smoking in August 2022.    Ambulatory blood pressure normal.  Past Medical History:  Diagnosis Date   Alcohol use    6-7 beers/day (as of 01/21/21)   Hypertension    PAD (peripheral artery disease) (HCC)    Tobacco abuse     Past Surgical History:  Procedure Laterality Date   AORTA - BILATERAL FEMORAL ARTERY BYPASS GRAFT Bilateral 02/06/2021   Procedure: AORTA BIFEMORAL BYPASS GRAFT with Omentopexy;  Surgeon: Cherre Robins, MD;  Location: [facility] OR;  Service: Vascular;  Laterality: Bilateral;   WISDOM TOOTH EXTRACTION      Current Medications: Current Meds  Medication Sig   aspirin EC 81 MG tablet Take 81 mg by mouth daily. Swallow whole.   gabapentin (NEURONTIN) 300 MG capsule Take 300-600 mg by mouth See admin instructions. Take 1 capsule (300 mg) by mouth in the morning & take 2 capsules (600 mg) by mouth in the evening.   hydrochlorothiazide (MICROZIDE) 12.5 MG capsule Take 1 capsule (12.5 mg total) by mouth daily.   hydroxyurea (HYDREA) 500 MG capsule Take 1 capsule (500 mg  total) by mouth daily. May take with food to minimize GI side effects.   Multiple Vitamin (MULTIVITAMIN WITH MINERALS) TABS tablet Take 1 tablet by mouth daily.   polyethylene glycol (MIRALAX) 17 g packet Take 17 g by mouth daily.   rosuvastatin (CRESTOR) 40 MG tablet Take 1 tablet (40 mg total) by mouth daily.   [DISCONTINUED] rosuvastatin (CRESTOR) 20 MG tablet Take 1 tablet (20 mg total) by mouth daily.     Allergies:   Penicillins   Social History   Socioeconomic History   Marital status: Single    Spouse name: Not on file   Number of children: Not on file   Years of education: Not on file   Highest education level: Not on file  Occupational History   Not on file  Tobacco Use   Smoking status: Former    Packs/day: 1.00    Types: Cigarettes    Quit date: 01/22/2021    Years since quitting: 0.3   Smokeless tobacco: Never   Tobacco comments:    Currently taking chantix-as of 01/28/2021-pt had not smoked in one week.Brandon Lara, Brandon Cassady8/23/202211:07 AM       Vaping Use   Vaping Use: Never used  Substance and Sexual Activity   Alcohol use: Yes    Alcohol/week: 49.0 standard drinks    Types: 49 Cans of beer per week  Comment: 6-7 beers a day (01/21/21)   Drug use: Never   Sexual activity: Not on file  Other Topics Concern   Not on file  Social History Narrative   Not on file   Social Determinants of Health   Financial Resource Strain: Not on file  Food Insecurity: Not on file  Transportation Needs: Not on file  Physical Activity: Not on file  Stress: Not on file  Social Connections: Not on file     Family History: History of coronary artery disease notable for no members. History of heart failure notable for no members. History of arrhythmia notable for no members.  ROS:   Please see the history of present illness.     All other systems reviewed and are negative.  EKGs/Labs/Other Studies Reviewed:    The following studies were reviewed  today:  EKG:   01/09/21: SR 97 borderline criteria anterior infarct  CT Aorta: Date:01/08/21 Results: 1. Right inflow disease. 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 Occlusion of the proximal right internal iliac artery. 2. Bilateral outflow vessels are patent without significant stenosis. 3. Mild bilateral runoff disease, left side worse than right. 4. Main visceral arteries are patent without significant stenosis.  5. Aortic Atherosclerosis 6. Heart not in cardiac FOV   Recent Labs: 02/09/2021: Magnesium 2.0 02/26/2021: ALT 38; BUN 9; Creatinine 0.84; Potassium 2.5; Sodium 139 04/24/2021: Hemoglobin 11.5; Platelet Count 890  Recent Lipid Panel    Component Value Date/Time   CHOL 171 01/21/2021 1617   TRIG 75 01/21/2021 1617   HDL 55 01/21/2021 1617   CHOLHDL 3.1 01/21/2021 1617   LDLCALC 102 (H) 01/21/2021 1617    Physical Exam:    VS:  BP 118/68   Pulse 83   Ht 5\' 6"  (1.676 m)   Wt 130 lb (59 kg)   SpO2 99%   BMI 20.98 kg/m     Wt Readings from Last 3 Encounters:  05/12/21 130 lb (59 kg)  04/24/21 129 lb 6.4 oz (58.7 kg)  03/21/21 116 lb 1.6 oz (52.7 kg)     Gen: No distress  Neck: No JVD Left subclavian bruit Ears:  No Pilar Plate Sign Cardiac: No Rubs or Gallops, no murmur, +2 radial pulses  regular  Respiratory: Clear to auscultation bilaterally, normal effort, normal respiratory rate GI: Soft, nontender, non-distended  MS: No  edema;  moves all extremities Integument: Skin feels warm Neuro:  At time of evaluation, alert and oriented to person/place/time/situation  Psych: Normal affect, patient feels well   ASSESSMENT:    1. Peripheral arterial disease ([place])   2. Bruit     PLAN:    PAD Aortic Atherosclerosis Former tobacco use (stopped August 2022) HTN   - LDL goal < 55 increase rosuvastatin to 40 mg; labs in three months - continue HCTZ 12.5 mg  - Bilateral Carotid artery duplex with subclavians - ASA 81 mg PO  daily  - see me in 6 months unless new issues; low threshold for lipid clinic  Medication Adjustments/Labs and Tests Ordered: Current medicines are reviewed at length with the patient today.  Concerns regarding medicines are outlined above.  Orders Placed This Encounter  Procedures   Lipid panel   ALT   VAS US CAROTID     Meds ordered this encounter  Medications   rosuvastatin (CRESTOR) 40 MG tablet    Sig: Take 1 tablet (40 mg total) by mouth daily.    Dispense:  90 tablet  Refill:  3     Patient Instructions  Medication Instructions:  Your physician has recommended you make the following change in your medication:  INCREASE: rosuvastatin (Crestor) to 40 mg by mouth daily  *If you need a refill on your cardiac medications before your next appointment, please call your pharmacy*   Lab Work: IN 3 MONTHS: FLP and ALT If you have labs (blood work) drawn today and your tests are completely normal, you will receive your results only by: [place] (if you have MyChart) OR A paper copy in the mail If you have any lab test that is abnormal or we need to change your treatment, we will call you to review the results.   Testing/Procedures: Your physician has requested that you have a carotid duplex. This test is an ultrasound of the carotid arteries in your neck. It looks at blood flow through these arteries that supply the brain with blood. Allow one hour for this exam. There are no restrictions or special instructions.    Follow-Up: At [facility], you and your health needs are our priority.  As part of our continuing mission to provide you with exceptional heart care, we have created designated Provider Care Teams.  These Care Teams include your primary Cardiologist (physician) and Advanced Practice Providers (APPs -  Physician Assistants and Nurse Practitioners) who all work together to provide you with the care you need, when you need it.   Your next appointment:    6 month(s)  The format for your next appointment:   In Person  Provider:   Werner Lean, MD {      Signed, Werner Lean, MD  05/12/2021 5:16 PM    [place]

## 2021-05-12 ENCOUNTER — Other Ambulatory Visit: Payer: Self-pay

## 2021-05-12 ENCOUNTER — Encounter: Payer: Self-pay | Admitting: Internal Medicine

## 2021-05-12 ENCOUNTER — Ambulatory Visit (INDEPENDENT_AMBULATORY_CARE_PROVIDER_SITE_OTHER): Payer: BC Managed Care – PPO | Admitting: Internal Medicine

## 2021-05-12 VITALS — BP 118/68 | HR 83 | Ht 66.0 in | Wt 130.0 lb

## 2021-05-12 DIAGNOSIS — I739 Peripheral vascular disease, unspecified: Secondary | ICD-10-CM | POA: Diagnosis not present

## 2021-05-12 DIAGNOSIS — R0989 Other specified symptoms and signs involving the circulatory and respiratory systems: Secondary | ICD-10-CM

## 2021-05-12 MED ORDER — ROSUVASTATIN CALCIUM 40 MG PO TABS: 40.0000 mg | ORAL_TABLET | Freq: Every day | ORAL | 3 refills | Status: DC

## 2021-05-12 NOTE — Patient Instructions (Signed)
Medication Instructions:  Your physician has recommended you make the following change in your medication:  INCREASE: rosuvastatin (Crestor) to 40 mg by mouth daily  *If you need a refill on your cardiac medications before your next appointment, please call your pharmacy*   Lab Work: IN 3 MONTHS: FLP and ALT If you have labs (blood work) drawn today and your tests are completely normal, you will receive your results only by: [place] (if you have MyChart) OR A paper copy in the mail If you have any lab test that is abnormal or we need to change your treatment, we will call you to review the results.   Testing/Procedures: Your physician has requested that you have a carotid duplex. This test is an ultrasound of the carotid arteries in your neck. It looks at blood flow through these arteries that supply the brain with blood. Allow one hour for this exam. There are no restrictions or special instructions.    Follow-Up: At [facility], you and your health needs are our priority.  As part of our continuing mission to provide you with exceptional heart care, we have created designated Provider Care Teams.  These Care Teams include your primary Cardiologist (physician) and Advanced Practice Providers (APPs -  Physician Assistants and Nurse Practitioners) who all work together to provide you with the care you need, when you need it.   Your next appointment:   6 month(s)  The format for your next appointment:   In Person  Provider:   Werner Lean, MD {

## 2021-05-23 ENCOUNTER — Ambulatory Visit (HOSPITAL_COMMUNITY)
Admission: RE | Admit: 2021-05-23 | Discharge: 2021-05-23 | Disposition: A | Discharge status: Home / Self Care | Payer: BC Managed Care – PPO | Source: Ambulatory Visit | Attending: Cardiovascular Disease | Admitting: Cardiovascular Disease

## 2021-05-23 ENCOUNTER — Other Ambulatory Visit: Payer: Self-pay

## 2021-05-23 DIAGNOSIS — R0989 Other specified symptoms and signs involving the circulatory and respiratory systems: Secondary | ICD-10-CM | POA: Diagnosis not present

## 2021-06-05 ENCOUNTER — Other Ambulatory Visit: Payer: Self-pay

## 2021-06-05 ENCOUNTER — Inpatient Hospital Stay: Payer: BC Managed Care – PPO | Attending: Oncology

## 2021-06-05 ENCOUNTER — Inpatient Hospital Stay (HOSPITAL_BASED_OUTPATIENT_CLINIC_OR_DEPARTMENT_OTHER): Payer: BC Managed Care – PPO | Admitting: Oncology

## 2021-06-05 VITALS — BP 114/79 | HR 86 | Temp 98.1°F | Resp 18 | Wt 134.4 lb

## 2021-06-05 DIAGNOSIS — D75839 Thrombocytosis, unspecified: Secondary | ICD-10-CM | POA: Insufficient documentation

## 2021-06-05 DIAGNOSIS — D473 Essential (hemorrhagic) thrombocythemia: Secondary | ICD-10-CM | POA: Insufficient documentation

## 2021-06-05 DIAGNOSIS — Z79899 Other long term (current) drug therapy: Secondary | ICD-10-CM | POA: Insufficient documentation

## 2021-06-05 DIAGNOSIS — Z880 Allergy status to penicillin: Secondary | ICD-10-CM | POA: Insufficient documentation

## 2021-06-05 DIAGNOSIS — D471 Chronic myeloproliferative disease: Secondary | ICD-10-CM | POA: Insufficient documentation

## 2021-06-05 DIAGNOSIS — D72829 Elevated white blood cell count, unspecified: Secondary | ICD-10-CM

## 2021-06-05 DIAGNOSIS — K5900 Constipation, unspecified: Secondary | ICD-10-CM | POA: Diagnosis not present

## 2021-06-05 DIAGNOSIS — E876 Hypokalemia: Secondary | ICD-10-CM | POA: Insufficient documentation

## 2021-06-05 LAB — CBC WITH DIFFERENTIAL (CANCER CENTER ONLY)
Abs Immature Granulocytes: 0.2 10*3/uL — ABNORMAL HIGH (ref 0.00–0.07)
Basophils Absolute: 0.2 10*3/uL — ABNORMAL HIGH (ref 0.0–0.1)
Basophils Relative: 1 %
Eosinophils Absolute: 0.4 10*3/uL (ref 0.0–0.5)
Eosinophils Relative: 2 %
HCT: 36.9 % — ABNORMAL LOW (ref 39.0–52.0)
Hemoglobin: 12 g/dL — ABNORMAL LOW (ref 13.0–17.0)
Immature Granulocytes: 1 %
Lymphocytes Relative: 21 %
Lymphs Abs: 3.8 10*3/uL (ref 0.7–4.0)
MCH: 32.3 pg (ref 26.0–34.0)
MCHC: 32.5 g/dL (ref 30.0–36.0)
MCV: 99.2 fL (ref 80.0–100.0)
Monocytes Absolute: 2 10*3/uL — ABNORMAL HIGH (ref 0.1–1.0)
Monocytes Relative: 11 %
Neutro Abs: 11.3 10*3/uL — ABNORMAL HIGH (ref 1.7–7.7)
Neutrophils Relative %: 64 %
Platelet Count: 613 10*3/uL — ABNORMAL HIGH (ref 150–400)
RBC: 3.72 MIL/uL — ABNORMAL LOW (ref 4.22–5.81)
RDW: 18.4 % — ABNORMAL HIGH (ref 11.5–15.5)
WBC Count: 17.8 10*3/uL — ABNORMAL HIGH (ref 4.0–10.5)
nRBC: 0 % (ref 0.0–0.2)

## 2021-06-05 NOTE — Progress Notes (Signed)
Hematology and Oncology Follow Up Visit  Brandon Lara [id number] [birth year] 60 y.o. 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  He was found to have leukocytosis and thrombocytosis.    Current therapy: Hydroxyurea 500 mg daily started in September 2022.  His dose was increased to 1000 mg 3 days a week, 500 mg daily in November 2022.  Interim History: [name] returns today for a follow-up visit.  Since the last visit, he reports no major changes in his health.  He tolerated the increased dose of hydroxyurea without any recent complaints.  He denies any nausea, vomiting or abdominal pain.  He denies any worsening diarrhea or constipation.  His performance status quality of life remain excellent.    Medications: Reviewed without changes. Current Outpatient Medications  Medication Sig Dispense Refill   aspirin EC 81 MG tablet Take 81 mg by mouth daily. Swallow whole.     gabapentin (NEURONTIN) 300 MG capsule Take 300-600 mg by mouth See admin instructions. Take 1 capsule (300 mg) by mouth in the morning & take 2 capsules (600 mg) by mouth in the evening.     hydrochlorothiazide (MICROZIDE) 12.5 MG capsule Take 1 capsule (12.5 mg total) by mouth daily. 30 capsule 2   hydroxyurea (HYDREA) 500 MG capsule Take 1 capsule (500 mg total) by mouth daily. May take with food to minimize GI side effects. 60 capsule 3   Multiple Vitamin (MULTIVITAMIN WITH MINERALS) TABS tablet Take 1 tablet by mouth daily.     polyethylene glycol (MIRALAX) 17 g packet Take 17 g by mouth daily. 30 each 2   rosuvastatin (CRESTOR) 40 MG tablet Take 1 tablet (40 mg total) by mouth daily. 90 tablet 3   No current facility-administered medications for this visit.     Allergies:  Allergies  Allergen Reactions   Penicillins Rash      Physical Exam:  Blood pressure 114/79, pulse 86,  temperature 98.1 F (36.7 C), temperature source Temporal, resp. rate 18, weight 134 lb 6.4 oz (61 kg), SpO2 99 %.  ECOG:  1   General appearance: Alert, awake without any distress. Head: Atraumatic without abnormalities Oropharynx: Without any thrush or ulcers. Eyes: No scleral icterus. Lymph nodes: No lymphadenopathy noted in the cervical, supraclavicular, or axillary nodes Heart:regular rate and rhythm, without any murmurs or gallops.   Lung: Clear to auscultation without any rhonchi, wheezes or dullness to percussion. Abdomin: Soft, nontender without any shifting dullness or ascites. Musculoskeletal: No clubbing or cyanosis. Neurological: No motor or sensory deficits. Skin: No rashes or lesions.      Lab Results: Lab Results  Component Value Date   WBC 17.8 (H) 06/05/2021   HGB 12.0 (L) 06/05/2021   HCT 36.9 (L) 06/05/2021   MCV 99.2 06/05/2021   PLT 613 (H) 06/05/2021     Chemistry      Component Value Date/Time   NA 139 02/26/2021 1420   NA 133 (L) 02/19/2021 1542   K 2.5 (LL) 02/26/2021 1420   CL 86 (L) 02/26/2021 1420   CO2 40 (H) 02/26/2021 1420   BUN 9 02/26/2021 1420   BUN 7 02/19/2021 1542   CREATININE 0.84 02/26/2021 1420      Component Value Date/Time   CALCIUM 9.7 02/26/2021 1420   ALKPHOS 92 02/26/2021 1420   AST 28 02/26/2021 1420   ALT 38 02/26/2021 1420   BILITOT 0.3 02/26/2021 1420  Impression and Plan:   60 year old with:  1.  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  He was found to have JAK2 positive mutation.  He is currently on hydroxyurea which she has tolerated with the current dose.  Laboratory data from today reviewed which shows showed a reasonable decline in the platelet count at this time close to 600 range.  Risks and benefits of continuing this dose were reviewed I recommended continuing this current dose and monitor platelet counts accordingly.  He has no objections at this time.    2.  Thrombosis  prophylaxis: His risk of thrombosis remains low given his age, and previous history and white cell count that is declining.  3.  Constipation: Improved at this time.  4.  Hypokalemia: We have discussed strategies to improve that with potassium rich diet.  He declined any additional supplements.   5.  Follow-up: In 8 weeks for repeat follow-up.     30 minutes were spent on this encounter.  Time was dedicated to reviewing laboratory data, disease status update and outlining future plan of care discussion.    Zola Button, MD 12/29/20222:53 PM

## 2021-06-24 ENCOUNTER — Other Ambulatory Visit (HOSPITAL_COMMUNITY): Payer: Self-pay | Admitting: Internal Medicine

## 2021-06-24 DIAGNOSIS — I6523 Occlusion and stenosis of bilateral carotid arteries: Secondary | ICD-10-CM

## 2021-07-21 ENCOUNTER — Other Ambulatory Visit: Payer: Self-pay

## 2021-07-21 ENCOUNTER — Encounter: Payer: Self-pay | Admitting: Student

## 2021-07-21 ENCOUNTER — Ambulatory Visit: Payer: BC Managed Care – PPO | Admitting: Student

## 2021-07-21 VITALS — BP 119/75 | HR 80 | Temp 98.2°F | Ht 66.0 in | Wt 135.7 lb

## 2021-07-21 DIAGNOSIS — E782 Mixed hyperlipidemia: Secondary | ICD-10-CM | POA: Diagnosis not present

## 2021-07-21 DIAGNOSIS — Z0000 Encounter for general adult medical examination without abnormal findings: Secondary | ICD-10-CM

## 2021-07-21 DIAGNOSIS — D72820 Lymphocytosis (symptomatic): Secondary | ICD-10-CM | POA: Diagnosis not present

## 2021-07-21 DIAGNOSIS — E538 Deficiency of other specified B group vitamins: Secondary | ICD-10-CM

## 2021-07-21 DIAGNOSIS — I10 Essential (primary) hypertension: Secondary | ICD-10-CM | POA: Diagnosis not present

## 2021-07-21 DIAGNOSIS — Z114 Encounter for screening for human immunodeficiency virus [HIV]: Secondary | ICD-10-CM

## 2021-07-21 DIAGNOSIS — G629 Polyneuropathy, unspecified: Secondary | ICD-10-CM

## 2021-07-21 DIAGNOSIS — Z1159 Encounter for screening for other viral diseases: Secondary | ICD-10-CM

## 2021-07-21 DIAGNOSIS — D473 Essential (hemorrhagic) thrombocythemia: Secondary | ICD-10-CM

## 2021-07-21 DIAGNOSIS — I739 Peripheral vascular disease, unspecified: Secondary | ICD-10-CM

## 2021-07-21 DIAGNOSIS — E876 Hypokalemia: Secondary | ICD-10-CM

## 2021-07-21 MED ORDER — GABAPENTIN 300 MG PO CAPS
300.0000 mg | ORAL_CAPSULE | Freq: Three times a day (TID) | ORAL | 3 refills | Status: DC
Start: 2021-07-21 — End: 2022-02-24

## 2021-07-21 MED ORDER — GABAPENTIN 300 MG PO CAPS: 300.0000 mg | ORAL_CAPSULE | Freq: Three times a day (TID) | ORAL | 3 refills | Status: DC

## 2021-07-21 MED ORDER — HYDROCHLOROTHIAZIDE 12.5 MG PO CAPS: 12.5000 mg | ORAL_CAPSULE | Freq: Every day | ORAL | 5 refills | Status: DC

## 2021-07-21 MED ORDER — HYDROCHLOROTHIAZIDE 12.5 MG PO CAPS: 12.5000 mg | ORAL_CAPSULE | Freq: Every day | ORAL | 1 refills | Status: DC

## 2021-07-21 NOTE — Patient Instructions (Addendum)
Thank you, Mr.Ronie D Federici for allowing [place] to provide your care today. Today we discussed her blood pressure, neuropathy, lab work.  We will neuropathy, I am increasing your gabapentin dose to 3 times daily.  I am also checking some lab work to make sure your potassium has improved  I have ordered the following labs for you:  Lab Orders         CMP14 + Anion Gap         Lipid Profile         CBC no Diff         Hepatitis C Ab reflex to Quant PCR         HIV antibody (with reflex)         Vitamin B12       I will call if any are abnormal. All of your labs can be accessed through "My Chart".  My Chart Access: [email or web address]  Please follow-up in 3-6 months  Please make sure to arrive 15 minutes prior to your next appointment. If you arrive late, you may be asked to reschedule.    We look forward to seeing you next time. Please call our clinic at [phone] if you have any questions or concerns. The best time to call is Monday-Friday from 9am-4pm, but there is someone available 24/7. If after hours or the weekend, call the main hospital number and ask for the Internal Medicine Resident On-Call. If you need medication refills, please notify your pharmacy one week in advance and they will send [place] a request.   Thank you for letting us take part in your care. Wishing you the best!  Lacinda Axon, MD 07/21/2021, 10:47 AM IM Resident, PGY-2 [facility] 41:10

## 2021-07-21 NOTE — Assessment & Plan Note (Addendum)
Patient was last CMP September 2022 showed a K+ of 2.5.  Patient was started on potassium supplementation but he became constipated so this was discontinued. No repeat CMP or BMP on file. On chart review, patient discussed managing this with Dr. Alen Blew with food rich in potassium. Patient reports some numbness and tingling in his lower extremities but no muscle spasms, cramps or weakness. Patient on low dose of HCTZ. Plan to check K+ and modify antihytensive agent if still low.   Plan: --Follow up CMP  Addendum: Hypokalemia has now resolved. K+ of 4.2 on CMP. We will continue to monitor. Patient informed of lab results.

## 2021-07-21 NOTE — Assessment & Plan Note (Addendum)
Patient reports he tried to establish care with [facility] since he lives in that area but unable to get an appointment.  His BP is well controlled.  He denies any headaches, blurry vision or dizziness. Vitals:   07/21/21 0941  BP: 119/75   Plan: --Refilled HCTZ 12.5 mg daily, will discontinue if K+ still low --Follow-up CMP --Follow-up with PCP in 2 to 3 months  Addendum: CMP shows normal kidney function, no electrolyte abnormalities. Hypokalemia has now resolved.  Patient and follow-up results and advised to continue current regimen.

## 2021-07-21 NOTE — Progress Notes (Signed)
° °  CC: Follow-up  HPI:  [name] is a 61 y.o. male with PMH as below who presents to clinic to follow-up on his chronic medical problems. Please see problem based charting for evaluation, assessment and plan.  Past Medical History:  Diagnosis Date   Alcohol use    6-7 beers/day (as of 01/21/21)   Hypertension    PAD (peripheral artery disease) (HCC)    Tobacco abuse     Review of Systems:  Constitutional: Negative for dizziness or fatigue Eyes: Negative for visual changes Respiratory: Negative for shortness of breath MSK: Negative for back pain Abdomen: Negative for abdominal pain, constipation or diarrhea Neuro: Positive for numbness and tingling. Negative for headache or weakness  Physical Exam: General: Pleasant, elderly Caucasian male No acute distress. Cardiac: RRR. No murmurs, rubs or gallops. No LE edema Respiratory: Lungs CTAB. No wheezing or crackles. Abdominal: Soft, symmetric and non tender. Normal BS. Skin: Warm, dry and intact without rashes or lesions Extremities: Atraumatic. Full ROM. Palpable radial and DP pulses. Neuro: A&O x 3. Moves all extremities. Normal sensation to gross touch. Strength 5/5 in all extremities.   Vitals:   07/21/21 0941  BP: 119/75  Pulse: 80  Temp: 98.2 F (36.8 C)  TempSrc: Oral  SpO2: 100%  Weight: 135 lb 11.2 oz (61.6 kg)  Height: 5\' 6"  (1.676 m)    Assessment & Plan:   See Encounters Tab for problem based charting.  Patient discussed with Dr. Lorenz Coaster, MD, MPH

## 2021-07-21 NOTE — Assessment & Plan Note (Addendum)
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  He is followed by Dr. Alen Blew at the cancer center. Patient's last CBC 5 weeks ago showed platelet down to 613. He has been stable on the current dose of hydroxyurea.  Plan: -- Continue hydroxyurea 500 mg daily with food -- Follow-up with Dr. Alen Blew as scheduled -- Follow-up repeat CBC  Addendum: CBC showed improved leukocytosis and thrombocytosis. WBC17.8-->15.8, platelet 613-->566, hgb 12.0-->13.8.

## 2021-07-21 NOTE — Assessment & Plan Note (Addendum)
Patient continues to follow with Dr. Roselie Awkward. He continues to have good blood flow to his lower extremities with 2+ DP pulses on both foot. 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 No rest pain. States he takes gabapentin only at bedtime. -- Continue to follow-up with Dr. Roselie Awkward -- Continue ASA 81 mg daily and Crestor 40 mg daily -- Increase gabapentin to 300 mg 3 times a day

## 2021-07-21 NOTE — Assessment & Plan Note (Addendum)
Patient with previous hx of alcohol use disorder here with a complaints of worsening numbness and tingling of the lower extremities down to his toes. 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 Reports he takes 1 pill of his gabapentin at night without significant improvement. Patient reports he was informed by Dr. Roselie Awkward that the nerve pain will resolve slowly a few months after the surgery.   Plan: --Increase gabapentin to 300 mg three times a day --Follow up vitamin B12 levels

## 2021-07-21 NOTE — Assessment & Plan Note (Signed)
Patient currently on rosuvastatin 40 mg daily. Due for repeat lipid panel today.  LDL goal <70.  -- Continue rosuvastatin 40 mg daily -- Follow-up lipid panel

## 2021-07-22 LAB — CMP14 + ANION GAP
ALT: 62 IU/L — ABNORMAL HIGH (ref 0–44)
AST: 43 IU/L — ABNORMAL HIGH (ref 0–40)
Albumin/Globulin Ratio: 1.7 (ref 1.2–2.2)
Albumin: 4.5 g/dL (ref 3.8–4.9)
Alkaline Phosphatase: 105 IU/L (ref 44–121)
Anion Gap: 16 mmol/L (ref 10.0–18.0)
BUN/Creatinine Ratio: 13 (ref 9–20)
BUN: 12 mg/dL (ref 6–24)
Bilirubin Total: 0.3 mg/dL (ref 0.0–1.2)
CO2: 29 mmol/L (ref 20–29)
Calcium: 10.1 mg/dL (ref 8.7–10.2)
Chloride: 95 mmol/L — ABNORMAL LOW (ref 96–106)
Creatinine, Ser: 0.9 mg/dL (ref 0.76–1.27)
Globulin, Total: 2.7 g/dL (ref 1.5–4.5)
Glucose: 89 mg/dL (ref 70–99)
Potassium: 4.2 mmol/L (ref 3.5–5.2)
Sodium: 140 mmol/L (ref 134–144)
Total Protein: 7.2 g/dL (ref 6.0–8.5)
eGFR: 98 mL/min/{1.73_m2} (ref >59)

## 2021-07-22 LAB — LIPID PANEL
Chol/HDL Ratio: 3.3 ratio (ref 0.0–5.0)
Cholesterol, Total: 115 mg/dL (ref 100–199)
HDL: 35 mg/dL — ABNORMAL LOW (ref >39)
LDL Chol Calc (NIH): 57 mg/dL (ref 0–99)
Triglycerides: 129 mg/dL (ref 0–149)
VLDL Cholesterol Cal: 23 mg/dL (ref 5–40)

## 2021-07-22 LAB — HCV INTERPRETATION

## 2021-07-22 LAB — CBC
Hematocrit: 39.9 % (ref 37.5–51.0)
Hemoglobin: 13.8 g/dL (ref 13.0–17.7)
MCH: 34.2 pg — ABNORMAL HIGH (ref 26.6–33.0)
MCHC: 34.6 g/dL (ref 31.5–35.7)
MCV: 99 fL — ABNORMAL HIGH (ref 79–97)
Platelets: 566 10*3/uL — ABNORMAL HIGH (ref 150–450)
RBC: 4.04 x10E6/uL — ABNORMAL LOW (ref 4.14–5.80)
RDW: 16 % — ABNORMAL HIGH (ref 11.6–15.4)
WBC: 15.8 10*3/uL — ABNORMAL HIGH (ref 3.4–10.8)

## 2021-07-22 LAB — HIV ANTIBODY (ROUTINE TESTING W REFLEX): HIV Screen 4th Generation wRfx: NONREACTIVE

## 2021-07-22 LAB — HCV AB W REFLEX TO QUANT PCR: HCV Ab: NONREACTIVE

## 2021-07-22 LAB — VITAMIN B12: Vitamin B-12: 498 pg/mL (ref 232–1245)

## 2021-07-24 NOTE — Assessment & Plan Note (Signed)
Negative HIV and hep C screen screening

## 2021-07-28 NOTE — Progress Notes (Signed)
Internal Medicine Clinic Attending  Case discussed with Dr. [place]  At the time of the visit.  We reviewed the resident's history and exam and pertinent patient test results.  I agree with the assessment, diagnosis, and plan of care documented in the resident's note.  

## 2021-08-07 ENCOUNTER — Inpatient Hospital Stay: Payer: BC Managed Care – PPO | Attending: Oncology

## 2021-08-07 ENCOUNTER — Other Ambulatory Visit: Payer: Self-pay

## 2021-08-07 ENCOUNTER — Inpatient Hospital Stay: Payer: BC Managed Care – PPO | Admitting: Oncology

## 2021-08-07 VITALS — BP 117/74 | HR 96 | Temp 98.1°F | Resp 20 | Wt 140.5 lb

## 2021-08-07 DIAGNOSIS — Z79899 Other long term (current) drug therapy: Secondary | ICD-10-CM | POA: Insufficient documentation

## 2021-08-07 DIAGNOSIS — Z880 Allergy status to penicillin: Secondary | ICD-10-CM | POA: Diagnosis not present

## 2021-08-07 DIAGNOSIS — D75839 Thrombocytosis, unspecified: Secondary | ICD-10-CM | POA: Diagnosis present

## 2021-08-07 DIAGNOSIS — D72829 Elevated white blood cell count, unspecified: Secondary | ICD-10-CM | POA: Diagnosis not present

## 2021-08-07 DIAGNOSIS — D473 Essential (hemorrhagic) thrombocythemia: Secondary | ICD-10-CM | POA: Diagnosis present

## 2021-08-07 DIAGNOSIS — E876 Hypokalemia: Secondary | ICD-10-CM | POA: Diagnosis not present

## 2021-08-07 DIAGNOSIS — D471 Chronic myeloproliferative disease: Secondary | ICD-10-CM | POA: Insufficient documentation

## 2021-08-07 DIAGNOSIS — K5900 Constipation, unspecified: Secondary | ICD-10-CM | POA: Insufficient documentation

## 2021-08-07 LAB — CBC WITH DIFFERENTIAL (CANCER CENTER ONLY)
Abs Immature Granulocytes: 0.22 10*3/uL — ABNORMAL HIGH (ref 0.00–0.07)
Basophils Absolute: 0.1 10*3/uL (ref 0.0–0.1)
Basophils Relative: 1 %
Eosinophils Absolute: 0.3 10*3/uL (ref 0.0–0.5)
Eosinophils Relative: 2 %
HCT: 36.5 % — ABNORMAL LOW (ref 39.0–52.0)
Hemoglobin: 12.3 g/dL — ABNORMAL LOW (ref 13.0–17.0)
Immature Granulocytes: 1 %
Lymphocytes Relative: 17 %
Lymphs Abs: 2.6 10*3/uL (ref 0.7–4.0)
MCH: 34.8 pg — ABNORMAL HIGH (ref 26.0–34.0)
MCHC: 33.7 g/dL (ref 30.0–36.0)
MCV: 103.4 fL — ABNORMAL HIGH (ref 80.0–100.0)
Monocytes Absolute: 1.8 10*3/uL — ABNORMAL HIGH (ref 0.1–1.0)
Monocytes Relative: 12 %
Neutro Abs: 10.4 10*3/uL — ABNORMAL HIGH (ref 1.7–7.7)
Neutrophils Relative %: 67 %
Platelet Count: 532 10*3/uL — ABNORMAL HIGH (ref 150–400)
RBC: 3.53 MIL/uL — ABNORMAL LOW (ref 4.22–5.81)
RDW: 16 % — ABNORMAL HIGH (ref 11.5–15.5)
WBC Count: 15.5 10*3/uL — ABNORMAL HIGH (ref 4.0–10.5)
nRBC: 0 % (ref 0.0–0.2)

## 2021-08-07 NOTE — Progress Notes (Signed)
Hematology and Oncology Follow Up Visit ? ?Brandon Lara ?[id number] ?[birth year] 61 y.o. ?08/07/2021 3:43 PM ?Brandon Lara, MDCoe, Brandon Lara Kitchen, MD  ? ?Principle Diagnosis: 61 year old with myeloproliferative disorder presented with a thrombocytosis in September 2022.  He was found to have JAK2 positive essential thrombocythemia. ? ? ? ?Current therapy: Hydroxyurea 500 mg daily started in September 2022.  His dose was increased to 1000 mg 3 days a week, 500 mg daily in November 2022. ? ?Interim History: [name] is here for a follow-up visit.  Since last visit, he reports no major complaints at this time.  He continues to tolerate hydroxyurea dosing without any issues.  His performance status and quality of life remained excellent.  He denies any nausea, fatigue or GI issues.   ? ? ? ?Medications: Updated on review. ?Current Outpatient Medications  ?Medication Sig Dispense Refill  ? aspirin EC 81 MG tablet Take 81 mg by mouth daily. Swallow whole.    ? gabapentin (NEURONTIN) 300 MG capsule Take 1 capsule (300 mg total) by mouth 3 (three) times daily. 90 capsule 3  ? hydrochlorothiazide (MICROZIDE) 12.5 MG capsule Take 1 capsule (12.5 mg total) by mouth daily. 30 capsule 5  ? hydroxyurea (HYDREA) 500 MG capsule Take 1 capsule (500 mg total) by mouth daily. May take with food to minimize GI side effects. 60 capsule 3  ? Multiple Vitamin (MULTIVITAMIN WITH MINERALS) TABS tablet Take 1 tablet by mouth daily.    ? polyethylene glycol (MIRALAX) 17 g packet Take 17 g by mouth daily. 30 each 2  ? rosuvastatin (CRESTOR) 40 MG tablet Take 1 tablet (40 mg total) by mouth daily. 90 tablet 3  ? ?No current facility-administered medications for this visit.  ? ? ? ?Allergies:  ?Allergies  ?Allergen Reactions  ? Penicillins Rash  ? ? ? ? ?Physical Exam: ? ?Blood pressure 117/74, pulse 96, temperature 98.1 ?F (36.7 ?C), resp. rate 20, weight 140 lb 8 oz (63.7 kg), SpO2 100 %. ? ?ECOG:  1 ? ? ? ?General appearance: Comfortable  appearing without any discomfort ?Head: Normocephalic without any trauma ?Oropharynx: Mucous membranes are moist and pink without any thrush or ulcers. ?Eyes: Pupils are equal and round reactive to light. ?Lymph nodes: No cervical, supraclavicular, inguinal or axillary lymphadenopathy.   ?Heart:regular rate and rhythm.  S1 and S2 without leg edema. ?Lung: Clear without any rhonchi or wheezes.  No dullness to percussion. ?Abdomin: Soft, nontender, nondistended with good bowel sounds.  No hepatosplenomegaly. ?Musculoskeletal: No joint deformity or effusion.  Full range of motion noted. ?Neurological: No deficits noted on motor, sensory and deep tendon reflex exam. ?Skin: No petechial rash or dryness.  Appeared moist.  ? ? ? ? ? ? ?Lab Results: ?Lab Results  ?Component Value Date  ? WBC 15.5 (H) 08/07/2021  ? HGB 12.3 (L) 08/07/2021  ? HCT 36.5 (L) 08/07/2021  ? MCV 103.4 (H) 08/07/2021  ? PLT 532 (H) 08/07/2021  ? ?  Chemistry   ?   ?Component Value Date/Time  ? NA 140 07/21/2021 1107  ? K 4.2 07/21/2021 1107  ? CL 95 (L) 07/21/2021 1107  ? CO2 29 07/21/2021 1107  ? BUN 12 07/21/2021 1107  ? CREATININE 0.90 07/21/2021 1107  ? CREATININE 0.84 02/26/2021 1420  ?    ?Component Value Date/Time  ? CALCIUM 10.1 07/21/2021 1107  ? ALKPHOS 105 07/21/2021 1107  ? AST 43 (H) 07/21/2021 1107  ? AST 28 02/26/2021 1420  ? ALT 62 (H) 07/21/2021 1107  ?  ALT 38 02/26/2021 1420  ? BILITOT 0.3 07/21/2021 1107  ? BILITOT 0.3 02/26/2021 1420  ?  ? ? ? ? ? ?Impression and Plan: ? ? ?61 year old with: ? ?1.  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  ? ?Laboratory data from today reviewed and showed counts to be under adequate control with the current dosing of hydroxyurea. ?His platelet count continues to decline currently at 532 with a reasonable control of his leukocytosis.  Risks and benefits of continuing this treatment were reviewed at this time and he is agreeable to continue. ?  ?2.  Thrombosis  prophylaxis: Risk of thrombosis remains low at this time.  His white cell count is under reasonable control which decreases the risk of thrombosis. ? ?3.  Constipation: No issues reported at this time and has resolved.  I continue to encourage adequate hydration high-fiber diet. ? ?4.  Hypokalemia: Resolved at this time and will be updated with his visits. ?  ?5.  Follow-up: In 2 months for follow-up evaluation. ?  ?  ?30 minutes were spent on this visit.  The time was dedicated to reviewing laboratory data, disease status update and outlining future plan of care discussion ?  ? ?Brandon Button, MD ?3/2/20233:43 PM ? ?

## 2021-08-11 ENCOUNTER — Other Ambulatory Visit: Payer: BC Managed Care – PPO | Admitting: *Deleted

## 2021-08-11 ENCOUNTER — Other Ambulatory Visit: Payer: Self-pay

## 2021-08-11 DIAGNOSIS — I739 Peripheral vascular disease, unspecified: Secondary | ICD-10-CM

## 2021-08-11 LAB — LIPID PANEL
Chol/HDL Ratio: 3 ratio (ref 0.0–5.0)
Cholesterol, Total: 114 mg/dL (ref 100–199)
HDL: 38 mg/dL — ABNORMAL LOW (ref >39)
LDL Chol Calc (NIH): 58 mg/dL (ref 0–99)
Triglycerides: 93 mg/dL (ref 0–149)
VLDL Cholesterol Cal: 18 mg/dL (ref 5–40)

## 2021-08-11 LAB — ALT: ALT: 41 IU/L (ref 0–44)

## 2021-09-18 ENCOUNTER — Other Ambulatory Visit: Payer: Self-pay | Admitting: *Deleted

## 2021-09-18 DIAGNOSIS — D72829 Elevated white blood cell count, unspecified: Secondary | ICD-10-CM

## 2021-09-18 MED ORDER — HYDROXYUREA 500 MG PO CAPS: 500.0000 mg | ORAL_CAPSULE | Freq: Every day | ORAL | 3 refills | Status: DC

## 2021-10-08 ENCOUNTER — Telehealth: Payer: Self-pay | Admitting: Oncology

## 2021-10-08 NOTE — Telephone Encounter (Signed)
Called patient regarding upcoming appointments, patient is notified. 

## 2021-10-09 ENCOUNTER — Other Ambulatory Visit: Payer: Self-pay

## 2021-10-09 ENCOUNTER — Inpatient Hospital Stay: Payer: BC Managed Care – PPO | Admitting: Oncology

## 2021-10-09 ENCOUNTER — Inpatient Hospital Stay: Payer: BC Managed Care – PPO | Attending: Oncology

## 2021-10-09 VITALS — BP 128/70 | HR 91 | Temp 97.9°F | Resp 16 | Wt 145.7 lb

## 2021-10-09 DIAGNOSIS — Z880 Allergy status to penicillin: Secondary | ICD-10-CM | POA: Diagnosis not present

## 2021-10-09 DIAGNOSIS — D75839 Thrombocytosis, unspecified: Secondary | ICD-10-CM | POA: Diagnosis present

## 2021-10-09 DIAGNOSIS — E876 Hypokalemia: Secondary | ICD-10-CM | POA: Insufficient documentation

## 2021-10-09 DIAGNOSIS — D72829 Elevated white blood cell count, unspecified: Secondary | ICD-10-CM | POA: Diagnosis not present

## 2021-10-09 DIAGNOSIS — Z79899 Other long term (current) drug therapy: Secondary | ICD-10-CM | POA: Diagnosis not present

## 2021-10-09 DIAGNOSIS — D471 Chronic myeloproliferative disease: Secondary | ICD-10-CM | POA: Diagnosis present

## 2021-10-09 DIAGNOSIS — D473 Essential (hemorrhagic) thrombocythemia: Secondary | ICD-10-CM | POA: Insufficient documentation

## 2021-10-09 DIAGNOSIS — G629 Polyneuropathy, unspecified: Secondary | ICD-10-CM | POA: Diagnosis not present

## 2021-10-09 DIAGNOSIS — K5900 Constipation, unspecified: Secondary | ICD-10-CM | POA: Diagnosis not present

## 2021-10-09 LAB — CBC WITH DIFFERENTIAL (CANCER CENTER ONLY)
Abs Immature Granulocytes: 0.2 10*3/uL — ABNORMAL HIGH (ref 0.00–0.07)
Basophils Absolute: 0.1 10*3/uL (ref 0.0–0.1)
Basophils Relative: 1 %
Eosinophils Absolute: 0.3 10*3/uL (ref 0.0–0.5)
Eosinophils Relative: 2 %
HCT: 38.2 % — ABNORMAL LOW (ref 39.0–52.0)
Hemoglobin: 12.9 g/dL — ABNORMAL LOW (ref 13.0–17.0)
Immature Granulocytes: 1 %
Lymphocytes Relative: 25 %
Lymphs Abs: 3.6 10*3/uL (ref 0.7–4.0)
MCH: 36.3 pg — ABNORMAL HIGH (ref 26.0–34.0)
MCHC: 33.8 g/dL (ref 30.0–36.0)
MCV: 107.6 fL — ABNORMAL HIGH (ref 80.0–100.0)
Monocytes Absolute: 1.3 10*3/uL — ABNORMAL HIGH (ref 0.1–1.0)
Monocytes Relative: 9 %
Neutro Abs: 8.9 10*3/uL — ABNORMAL HIGH (ref 1.7–7.7)
Neutrophils Relative %: 62 %
Platelet Count: 624 10*3/uL — ABNORMAL HIGH (ref 150–400)
RBC: 3.55 MIL/uL — ABNORMAL LOW (ref 4.22–5.81)
RDW: 13.5 % (ref 11.5–15.5)
WBC Count: 14.5 10*3/uL — ABNORMAL HIGH (ref 4.0–10.5)
nRBC: 0 % (ref 0.0–0.2)

## 2021-10-09 NOTE — Progress Notes (Signed)
Hematology and Oncology Follow Up Visit ? ?Brandon Lara ?[id number] ?20-Nov-1960 61 y.o. ?10/09/2021 2:26 PM ?[name], MDCoe, [name], MD  ? ?Principle Diagnosis: 61 year old man with JAK2 positive myeloproliferative disorder presented with a thrombocytosis in September 2022.   ? ? ? ?Current therapy: Hydroxyurea 500 mg daily started in September 2022.  His dose was increased to 1000 mg 3 days a week, 500 mg daily in November 2022. ? ?Interim History: Mr. Hiney returns today for repeat evaluation.  Since last visit, he reports feeling well without any major complaints.  He continues to tolerate hydroxyurea without any major complications.  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  He denies any thrombosis or bleeding issues. ? ? ? ? ? ?Medications: Reviewed without changes. ?Current Outpatient Medications  ?Medication Sig Dispense Refill  ? aspirin EC 81 MG tablet Take 81 mg by mouth daily. Swallow whole.    ? gabapentin (NEURONTIN) 300 MG capsule Take 1 capsule (300 mg total) by mouth 3 (three) times daily. 90 capsule 3  ? hydrochlorothiazide (MICROZIDE) 12.5 MG capsule Take 1 capsule (12.5 mg total) by mouth daily. 30 capsule 5  ? hydroxyurea (HYDREA) 500 MG capsule Take 1 capsule (500 mg total) by mouth daily. May take with food to minimize GI side effects. 60 capsule 3  ? Multiple Vitamin (MULTIVITAMIN WITH MINERALS) TABS tablet Take 1 tablet by mouth daily.    ? polyethylene glycol (MIRALAX) 17 g packet Take 17 g by mouth daily. 30 each 2  ? rosuvastatin (CRESTOR) 40 MG tablet Take 1 tablet (40 mg total) by mouth daily. 90 tablet 3  ? ?No current facility-administered medications for this visit.  ? ? ? ?Allergies:  ?Allergies  ?Allergen Reactions  ? Penicillins Rash  ? ? ? ? ?Physical Exam: ? ?Blood pressure 128/70, pulse 91, temperature 97.9 ?F (36.6 ?C), temperature source Temporal, resp. rate 16, weight 145 lb 11.2 oz (66.1 kg), SpO2  100 %. ? ? ?ECOG:  1 ? ? ?General appearance: Alert, awake without any distress. ?Head: Atraumatic without abnormalities ?Oropharynx: Without any thrush or ulcers. ?Eyes: No scleral icterus. ?Lymph nodes: No lymphadenopathy noted in the cervical, supraclavicular, or axillary nodes ?Heart:regular rate and rhythm, without any murmurs or gallops.   ?Lung: Clear to auscultation without any rhonchi, wheezes or dullness to percussion. ?Abdomin: Soft, nontender without any shifting dullness or ascites. ?Musculoskeletal: No clubbing or cyanosis. ?Neurological: No motor or sensory deficits. ?Skin: No rashes or lesions. ? ? ? ? ? ? ? ?Lab Results: ?Lab Results  ?Component Value Date  ? WBC 15.5 (H) 08/07/2021  ? HGB 12.3 (L) 08/07/2021  ? HCT 36.5 (L) 08/07/2021  ? MCV 103.4 (H) 08/07/2021  ? PLT 532 (H) 08/07/2021  ? ?  Chemistry   ?   ?Component Value Date/Time  ? NA 140 07/21/2021 1107  ? K 4.2 07/21/2021 1107  ? CL 95 (L) 07/21/2021 1107  ? CO2 29 07/21/2021 1107  ? BUN 12 07/21/2021 1107  ? CREATININE 0.90 07/21/2021 1107  ? CREATININE 0.84 02/26/2021 1420  ?    ?Component Value Date/Time  ? CALCIUM 10.1 07/21/2021 1107  ? ALKPHOS 105 07/21/2021 1107  ? AST 43 (H) 07/21/2021 1107  ? AST 28 02/26/2021 1420  ? ALT 41 08/11/2021 0924  ? ALT 38 02/26/2021 1420  ? BILITOT 0.3 07/21/2021 1107  ? BILITOT 0.3 02/26/2021 1420  ?  ? ? ? ? ? ?Impression and  Plan: ? ? ?61 year old with: ? ?1.  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  He was found to have JAK2 positive mutation. ? ? ?He is currently on hydroxyurea which he has tolerated very well with reasonable response to his platelet count.  Risks and benefits of continuing this treatment long-term were reviewed.  Complications that include GI toxicity, pruritus and the bone marrow complications were reviewed.  Laboratory data from today reviewed and showed a platelet count under reasonable control.  I recommended continuing hydroxyurea at this time and 500 mg daily  except for 2 days where he will take 1000 mg. ?  ?2.  Thrombosis prophylaxis: He is at low risk of thrombosis without any previous history and controlled white cell count. ? ?3.  Constipation: No longer an issue at this time.  Continue to educate him about adequate hydration and fiber intake. ? ?4.  Hypokalemia: Improved with the last that laboratory testing.  We will continue to monitor on hydroxyurea. ?  ?5.  Follow-up: 3 months for repeat follow-up. ?  ?  ?30 minutes were dedicated to this encounter.  The time was spent on reviewing laboratory data, treatment choices, addressing complications related to his treatment and future plan of care discussion. ?  ? ?Zola Button, MD ?5/4/20232:26 PM ? ?

## 2021-11-12 ENCOUNTER — Encounter: Payer: Self-pay | Admitting: Internal Medicine

## 2021-11-12 ENCOUNTER — Ambulatory Visit: Payer: BC Managed Care – PPO | Admitting: Internal Medicine

## 2021-11-12 VITALS — BP 130/70 | HR 74 | Ht 66.0 in | Wt 145.0 lb

## 2021-11-12 DIAGNOSIS — Z79899 Other long term (current) drug therapy: Secondary | ICD-10-CM

## 2021-11-12 DIAGNOSIS — M791 Myalgia, unspecified site: Secondary | ICD-10-CM

## 2021-11-12 DIAGNOSIS — I70219 Atherosclerosis of native arteries of extremities with intermittent claudication, unspecified extremity: Secondary | ICD-10-CM | POA: Diagnosis not present

## 2021-11-12 DIAGNOSIS — I739 Peripheral vascular disease, unspecified: Secondary | ICD-10-CM | POA: Diagnosis not present

## 2021-11-12 DIAGNOSIS — T466X5A Adverse effect of antihyperlipidemic and antiarteriosclerotic drugs, initial encounter: Secondary | ICD-10-CM | POA: Insufficient documentation

## 2021-11-12 HISTORY — DX: Adverse effect of antihyperlipidemic and antiarteriosclerotic drugs, initial encounter: T46.6X5A

## 2021-11-12 HISTORY — DX: Myalgia, unspecified site: M79.10

## 2021-11-12 NOTE — Patient Instructions (Signed)
Medication Instructions:  Stop Rosuvastatin   *If you need a refill on your cardiac medications before your next appointment, please call your pharmacy*   Lab Work: Lipid and ALT in 2 months FASTING   If you have labs (blood work) drawn today and your tests are completely normal, you will receive your results only by: [place] (if you have MyChart) OR A paper copy in the mail If you have any lab test that is abnormal or we need to change your treatment, we will call you to review the results.   Testing/Procedures: [facility] 2023 Your physician has requested that you have a carotid duplex. This test is an ultrasound of the carotid arteries in your neck. It looks at blood flow through these arteries that supply the brain with blood. Allow one hour for this exam. There are no restrictions or special instructions.    Follow-Up: At [facility], you and your health needs are our priority.  As part of our continuing mission to provide you with exceptional heart care, we have created designated Provider Care Teams.  These Care Teams include your primary Cardiologist (physician) and Advanced Practice Providers (APPs -  Physician Assistants and Nurse Practitioners) who all work together to provide you with the care you need, when you need it.  We recommend signing up for the patient portal called "MyChart".  Sign up information is provided on this After Visit Summary.  MyChart is used to connect with patients for Virtual Visits (Telemedicine).  Patients are able to view lab/test results, encounter notes, upcoming appointments, etc.  Non-urgent messages can be sent to your provider as well.   To learn more about what you can do with MyChart, go to [email or web address].    Your next appointment:   1 year(s)  The format for your next appointment:   In Person  Provider:   Werner Lean, MD     Other Instructions   Important Information About Sugar

## 2021-11-12 NOTE — Progress Notes (Signed)
Cardiology Office Note:    Date:  11/12/2021   ID:  Brandon Lara, DOB 1961/05/03, MRN [id number]  PCP:  [name], MD   [facility] Providers Cardiologist:  Werner Lean, MD     Referring MD: [name], MD   CC: F/u HTN, HLD  History of Present Illness:    Brandon Lara is a 61 y.o. male with a hx of PAD planned for aortofemoral bypass, tobacco use who presents for evaluation 01/09/21.  2022:  Had high BP that were discrepant from our eval, patient had surgery without cardiac issues, has since has onc evals for leukocytosis and thrombocytosis.  Quit Smoking  Patient notes that he is doing ok.   Since last visit notes  seeing Heme Onc; on hydroxyurea and was been told he is doing great.. There are no interval hospital/ED visit.    Notes that since he is on rosuvastatin 20 mg  he had elbow pain. With an increased dose to 40 mg had shoulder pain. Pain wakes him from sleep.  No chest pain or pressure .  No SOB/DOE and no PND/Orthopnea.  No weight gain or leg swelling.  No palpitations or syncope .  Still notes that he has constant foot numbness.  No sores.  Sees Dr. [name] and takes gabapentin.   Past Medical History:  Diagnosis Date   Alcohol use    6-7 beers/day (as of 01/21/21)   Hypertension    PAD (peripheral artery disease) (HCC)    Tobacco abuse     Past Surgical History:  Procedure Laterality Date   AORTA - BILATERAL FEMORAL ARTERY BYPASS GRAFT Bilateral 02/06/2021   Procedure: AORTA BIFEMORAL BYPASS GRAFT with Omentopexy;  Surgeon: Cherre Robins, MD;  Location: [facility] OR;  Service: Vascular;  Laterality: Bilateral;   WISDOM TOOTH EXTRACTION      Current Medications: Current Meds  Medication Sig   aspirin EC 81 MG tablet Take 81 mg by mouth daily. Swallow whole.   gabapentin (NEURONTIN) 300 MG capsule Take 1 capsule (300 mg total) by mouth 3 (three) times daily.   hydrochlorothiazide (MICROZIDE) 12.5 MG capsule Take 1 capsule (12.5 mg  total) by mouth daily.   hydroxyurea (HYDREA) 500 MG capsule Take 1 capsule (500 mg total) by mouth daily. May take with food to minimize GI side effects.   Multiple Vitamin (MULTIVITAMIN WITH MINERALS) TABS tablet Take 1 tablet by mouth daily.   polyethylene glycol (MIRALAX) 17 g packet Take 17 g by mouth daily.   [DISCONTINUED] rosuvastatin (CRESTOR) 40 MG tablet Take 1 tablet (40 mg total) by mouth daily.     Allergies:   Penicillins   Social History   Socioeconomic History   Marital status: Single    Spouse name: Not on file   Number of children: Not on file   Years of education: Not on file   Highest education level: Not on file  Occupational History   Not on file  Tobacco Use   Smoking status: Former    Packs/day: 1.00    Types: Cigarettes    Quit date: 01/22/2021    Years since quitting: 0.8   Smokeless tobacco: Never   Tobacco comments:           Vaping Use   Vaping Use: Never used  Substance and Sexual Activity   Alcohol use: Yes    Alcohol/week: 49.0 standard drinks    Types: 49 Cans of beer per week    Comment: Beer sometimes.  Drug use: Never   Sexual activity: Not on file  Other Topics Concern   Not on file  Social History Narrative   Not on file   Social Determinants of Health   Financial Resource Strain: Not on file  Food Insecurity: Not on file  Transportation Needs: Not on file  Physical Activity: Not on file  Stress: Not on file  Social Connections: Not on file     Family History: History of coronary artery disease notable for no members. History of heart failure notable for no members. History of arrhythmia notable for no members.  ROS:   Please see the history of present illness.     All other systems reviewed and are negative.  EKGs/Labs/Other Studies Reviewed:    The following studies were reviewed today:  EKG:   01/09/21: SR 97 borderline criteria anterior infarct  CT Aorta: Date:01/08/21 Results: 1. Right inflow disease.  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 Occlusion of the proximal right internal iliac artery. 2. Bilateral outflow vessels are patent without significant stenosis. 3. Mild bilateral runoff disease, left side worse than right. 4. Main visceral arteries are patent without significant stenosis.  5. Aortic Atherosclerosis 6. Heart not in cardiac FOV   Recent Labs: 02/09/2021: Magnesium 2.0 07/21/2021: BUN 12; Creatinine, Ser 0.90; Potassium 4.2; Sodium 140 08/11/2021: ALT 41 10/09/2021: Hemoglobin 12.9; Platelet Count 624  Recent Lipid Panel    Component Value Date/Time   CHOL 114 08/11/2021 0924   TRIG 93 08/11/2021 0924   HDL 38 (L) 08/11/2021 0924   CHOLHDL 3.0 08/11/2021 0924   LDLCALC 58 08/11/2021 0924    Physical Exam:    VS:  BP 130/70   Pulse 74   Ht '5\' 6"'$  (1.676 m)   Wt 145 lb (65.8 kg)   SpO2 97%   BMI 23.40 kg/m     Wt Readings from Last 3 Encounters:  11/12/21 145 lb (65.8 kg)  10/09/21 145 lb 11.2 oz (66.1 kg)  08/07/21 140 lb 8 oz (63.7 kg)     Gen: No distress  Neck: No JVD subtle left subclavian bruit Cardiac: No Rubs or Gallops, no murmur, +2 radial pulses  regular rate Respiratory: Clear to auscultation bilaterally, normal effort, normal respiratory rate GI: Soft, nontender, non-distended  MS: No  edema;  moves all extremities, good pedal pulses, cap refill in distal extremity Integument: Skin feels warm Neuro:  At time of evaluation, alert and oriented to person/place/time/situation  Psych: Normal affect, patient feels well   ASSESSMENT:    1. Myalgia due to statin   2. Peripheral arterial disease ([place])   3. Atherosclerotic peripheral vascular disease with intermittent claudication ([place])   4. PAD (peripheral artery disease) ([place])   5. Medication management      PLAN:    Statin Myalgia (rosuvastatin 20 mg and 40 mg) PAD Aortic Atherosclerosis, HLD Former tobacco use (stopped August 2022) HTN   Mild CAS  with plans for 05/2022 f/u - will stop statin, Lipids and ALT in two months; will likely add zetia 10 and potentially pravastatin; would be appropriate for PSCK9i, Bempedoic acid, or inclisiran in the future - continue HCTZ 12.5 mg  - ASA 81 mg PO daily - we have discussed my suspicion for CAC; Calcium score would not change our management; if he has new fatigue, SOB, Chest discomfort, low threshold for CCTA  One year  Will FYI Dr. Stanford Breed about his persistent leg numbness post aortofemoral bypass  Medication Adjustments/Labs and Tests Ordered: Current medicines are reviewed at length with the patient today.  Concerns regarding medicines are outlined above.  Orders Placed This Encounter  Procedures   Lipid Profile   ALT   VAS US CAROTID     No orders of the defined types were placed in this encounter.    Patient Instructions  Medication Instructions:  Stop Rosuvastatin   *If you need a refill on your cardiac medications before your next appointment, please call your pharmacy*   Lab Work: Lipid and ALT in 2 months FASTING   If you have labs (blood work) drawn today and your tests are completely normal, you will receive your results only by: [place] (if you have MyChart) OR A paper copy in the mail If you have any lab test that is abnormal or we need to change your treatment, we will call you to review the results.   Testing/Procedures: [facility] 2023 Your physician has requested that you have a carotid duplex. This test is an ultrasound of the carotid arteries in your neck. It looks at blood flow through these arteries that supply the brain with blood. Allow one hour for this exam. There are no restrictions or special instructions.    Follow-Up: At [facility], you and your health needs are our priority.  As part of our continuing mission to provide you with exceptional heart care, we have created designated Provider Care Teams.  These Care Teams include your  primary Cardiologist (physician) and Advanced Practice Providers (APPs -  Physician Assistants and Nurse Practitioners) who all work together to provide you with the care you need, when you need it.  We recommend signing up for the patient portal called "MyChart".  Sign up information is provided on this After Visit Summary.  MyChart is used to connect with patients for Virtual Visits (Telemedicine).  Patients are able to view lab/test results, encounter notes, upcoming appointments, etc.  Non-urgent messages can be sent to your provider as well.   To learn more about what you can do with MyChart, go to [email or web address].    Your next appointment:   1 year(s)  The format for your next appointment:   In Person  Provider:   Werner Lean, MD     Other Instructions   Important Information About Sugar         Signed, Werner Lean, MD  11/12/2021 3:50 PM    [place]

## 2021-11-29 ENCOUNTER — Encounter: Payer: Self-pay | Admitting: *Deleted

## 2021-12-02 ENCOUNTER — Telehealth: Payer: Self-pay | Admitting: Oncology

## 2021-12-02 NOTE — Telephone Encounter (Signed)
Called patient regarding upcoming August appointment, patient is notified. 

## 2022-01-09 ENCOUNTER — Inpatient Hospital Stay: Payer: BC Managed Care – PPO | Attending: Oncology

## 2022-01-09 ENCOUNTER — Other Ambulatory Visit: Payer: Self-pay

## 2022-01-09 ENCOUNTER — Inpatient Hospital Stay: Payer: BC Managed Care – PPO | Admitting: Oncology

## 2022-01-09 VITALS — BP 123/79 | HR 94 | Temp 98.2°F | Resp 15 | Wt 151.0 lb

## 2022-01-09 DIAGNOSIS — Z880 Allergy status to penicillin: Secondary | ICD-10-CM | POA: Insufficient documentation

## 2022-01-09 DIAGNOSIS — D471 Chronic myeloproliferative disease: Secondary | ICD-10-CM | POA: Diagnosis present

## 2022-01-09 DIAGNOSIS — D473 Essential (hemorrhagic) thrombocythemia: Secondary | ICD-10-CM | POA: Diagnosis present

## 2022-01-09 DIAGNOSIS — Z79899 Other long term (current) drug therapy: Secondary | ICD-10-CM | POA: Insufficient documentation

## 2022-01-09 DIAGNOSIS — D75839 Thrombocytosis, unspecified: Secondary | ICD-10-CM | POA: Insufficient documentation

## 2022-01-09 DIAGNOSIS — D72829 Elevated white blood cell count, unspecified: Secondary | ICD-10-CM

## 2022-01-09 DIAGNOSIS — K5900 Constipation, unspecified: Secondary | ICD-10-CM | POA: Diagnosis not present

## 2022-01-09 LAB — CBC WITH DIFFERENTIAL (CANCER CENTER ONLY)
Abs Immature Granulocytes: 0.33 10*3/uL — ABNORMAL HIGH (ref 0.00–0.07)
Basophils Absolute: 0.2 10*3/uL — ABNORMAL HIGH (ref 0.0–0.1)
Basophils Relative: 1 %
Eosinophils Absolute: 0.3 10*3/uL (ref 0.0–0.5)
Eosinophils Relative: 2 %
HCT: 39.6 % (ref 39.0–52.0)
Hemoglobin: 13.5 g/dL (ref 13.0–17.0)
Immature Granulocytes: 2 %
Lymphocytes Relative: 22 %
Lymphs Abs: 3.5 10*3/uL (ref 0.7–4.0)
MCH: 35.7 pg — ABNORMAL HIGH (ref 26.0–34.0)
MCHC: 34.1 g/dL (ref 30.0–36.0)
MCV: 104.8 fL — ABNORMAL HIGH (ref 80.0–100.0)
Monocytes Absolute: 1.4 10*3/uL — ABNORMAL HIGH (ref 0.1–1.0)
Monocytes Relative: 9 %
Neutro Abs: 10 10*3/uL — ABNORMAL HIGH (ref 1.7–7.7)
Neutrophils Relative %: 64 %
Platelet Count: 642 10*3/uL — ABNORMAL HIGH (ref 150–400)
RBC: 3.78 MIL/uL — ABNORMAL LOW (ref 4.22–5.81)
RDW: 14 % (ref 11.5–15.5)
WBC Count: 15.6 10*3/uL — ABNORMAL HIGH (ref 4.0–10.5)
nRBC: 0 % (ref 0.0–0.2)

## 2022-01-09 NOTE — Progress Notes (Signed)
Hematology and Oncology Follow Up Visit  Brandon Lara [id number] 03/25/1961 61 y.o. 01/09/2022 3:05 PM Brandon Lara, MDCoe, Brandon Kitchen, MD   Principle Diagnosis: 61 year old man with thrombocythemia diagnosed in September 2022.  He was found to have JAK2 positive myeloproliferative disorder.      Current therapy: Hydroxyurea 500 mg daily started in September 2022.  His dose was increased to 1000 mg 3 days a week and 500 mg daily 4 days a week in November 2022.  Interim History: Brandon Lara presents today for repeat follow-up.  Since the last visit, he reports feeling well without any major complaints.  He denies any nausea, vomiting or abdominal pain.  He denies any hospitalizations or illnesses.  He denies any thrombosis or bleeding.  He continues to tolerate hydroxyurea at the current dose although he rarely misses any doses.      Medications: Updated on review. Current Outpatient Medications  Medication Sig Dispense Refill   aspirin EC 81 MG tablet Take 81 mg by mouth daily. Swallow whole.     gabapentin (NEURONTIN) 300 MG capsule Take 1 capsule (300 mg total) by mouth 3 (three) times daily. 90 capsule 3   hydrochlorothiazide (MICROZIDE) 12.5 MG capsule Take 1 capsule (12.5 mg total) by mouth daily. 30 capsule 5   hydroxyurea (HYDREA) 500 MG capsule Take 1 capsule (500 mg total) by mouth daily. May take with food to minimize GI side effects. 60 capsule 3   Multiple Vitamin (MULTIVITAMIN WITH MINERALS) TABS tablet Take 1 tablet by mouth daily.     polyethylene glycol (MIRALAX) 17 g packet Take 17 g by mouth daily. 30 each 2   No current facility-administered medications for this visit.     Allergies:  Allergies  Allergen Reactions   Penicillins Rash      Physical Exam:  Blood pressure 123/79, pulse 94, temperature 98.2 F (36.8 C), temperature source Temporal, resp. rate 15, weight 151 lb (68.5 kg), SpO2 100 %.   ECOG:  1    General appearance: Comfortable  appearing without any discomfort Head: Normocephalic without any trauma Oropharynx: Mucous membranes are moist and pink without any thrush or ulcers. Eyes: Pupils are equal and round reactive to light. Lymph nodes: No cervical, supraclavicular, inguinal or axillary lymphadenopathy.   Heart:regular rate and rhythm.  S1 and S2 without leg edema. Lung: Clear without any rhonchi or wheezes.  No dullness to percussion. Abdomin: Soft, nontender, nondistended with good bowel sounds.  No hepatosplenomegaly. Musculoskeletal: No joint deformity or effusion.  Full range of motion noted. Neurological: No deficits noted on motor, sensory and deep tendon reflex exam. Skin: No petechial rash or dryness.  Appeared moist.          Lab Results: Lab Results  Component Value Date   WBC 15.6 (H) 01/09/2022   HGB 13.5 01/09/2022   HCT 39.6 01/09/2022   MCV 104.8 (H) 01/09/2022   PLT 642 (H) 01/09/2022     Chemistry      Component Value Date/Time   NA 140 07/21/2021 1107   K 4.2 07/21/2021 1107   CL 95 (L) 07/21/2021 1107   CO2 29 07/21/2021 1107   BUN 12 07/21/2021 1107   CREATININE 0.90 07/21/2021 1107   CREATININE 0.84 02/26/2021 1420      Component Value Date/Time   CALCIUM 10.1 07/21/2021 1107   ALKPHOS 105 07/21/2021 1107   AST 43 (H) 07/21/2021 1107   AST 28 02/26/2021 1420   ALT 41 08/11/2021 0924   ALT 38  02/26/2021 1420   BILITOT 0.3 07/21/2021 1107   BILITOT 0.3 02/26/2021 1420         Impression and Plan:   61 year old with:  1.  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    Laboratory data from today reviewed and showed no evidence of rapid rise in his platelet count.  Risks and benefits of continuing the current dose and schedule were discussed and he is agreeable to continue.  He will continue to take hydroxyurea 500 mg daily except for 2 to 3 days a week when he takes 1000 mg.   2.  Thrombosis prophylaxis: The risk of thrombosis  remains low given his age and low platelet count.  3.  Constipation: Resolved currently.  He will continue to use stool softeners as needed.   4.  Follow-up: 3 months for repeat follow-up.     30 minutes were spent on this visit.  The time was dedicated to reviewing laboratory data, disease status update and outlining future plan of care discussion.  Brandon Button, MD 8/4/20233:05 PM

## 2022-01-13 ENCOUNTER — Other Ambulatory Visit: Payer: BC Managed Care – PPO

## 2022-01-13 DIAGNOSIS — Z79899 Other long term (current) drug therapy: Secondary | ICD-10-CM

## 2022-01-13 DIAGNOSIS — I739 Peripheral vascular disease, unspecified: Secondary | ICD-10-CM

## 2022-01-13 DIAGNOSIS — I70219 Atherosclerosis of native arteries of extremities with intermittent claudication, unspecified extremity: Secondary | ICD-10-CM

## 2022-01-13 LAB — LIPID PANEL
Chol/HDL Ratio: 5.2 ratio — ABNORMAL HIGH (ref 0.0–5.0)
Cholesterol, Total: 219 mg/dL — ABNORMAL HIGH (ref 100–199)
HDL: 42 mg/dL (ref >39)
LDL Chol Calc (NIH): 149 mg/dL — ABNORMAL HIGH (ref 0–99)
Triglycerides: 156 mg/dL — ABNORMAL HIGH (ref 0–149)
VLDL Cholesterol Cal: 28 mg/dL (ref 5–40)

## 2022-01-13 LAB — ALT: ALT: 22 IU/L (ref 0–44)

## 2022-01-14 ENCOUNTER — Telehealth: Payer: Self-pay

## 2022-01-14 DIAGNOSIS — I739 Peripheral vascular disease, unspecified: Secondary | ICD-10-CM

## 2022-01-14 DIAGNOSIS — I70219 Atherosclerosis of native arteries of extremities with intermittent claudication, unspecified extremity: Secondary | ICD-10-CM

## 2022-01-14 MED ORDER — EZETIMIBE 10 MG PO TABS: 10.0000 mg | ORAL_TABLET | Freq: Every day | ORAL | 3 refills | Status: DC

## 2022-01-14 NOTE — Telephone Encounter (Signed)
-----   Message from Werner Lean, MD sent at 01/14/2022 11:38 AM EDT ----- Results: LDL still elevated, ALT WNL Plan: Zetia 10 mg and lipid clinic referral  Werner Lean, MD

## 2022-01-14 NOTE — Telephone Encounter (Signed)
The patient has been notified of the result and verbalized understanding.  All questions (if any) were answered. Precious Gilding, RN 01/14/2022 2:09 PM   Advised pt a scheduler will call to schedule lipid clinic OV.

## 2022-02-11 NOTE — Progress Notes (Signed)
Patient ID: Brandon Lara                 DOB: [birth year]                    MRN: [id number]      HPI: Brandon Lara is a 61 y.o. male patient referred to lipid clinic by Dr. Gasper Sells. PMH is significant for PAD post aortofemoral bypass, tobacco use, HTN. Patient saw Dr. Gasper Sells in June 2023, rosuvastatin was stopped due to arm pain. Lipid panel drawn 8/8 showed LDL-C 149. Ezetimibe was started and patient referred to lipid clinic.  Patient presents today to lipid clinic. He states that he is still having weakness in his legs, pain in his shoulders and elbow and stiffness in his hands/back in the morning. He states that the stiffness improves once he gets up but the pain in his shoulders persists. His pain did not get any better when he was off of rosuvastatin for 8 weeks. He still has numbness in his foot since his bypass surgery. He has a Network engineer job Scientist, [employer]). Does his own yard work (hurts the next day), but otherwise is not very active. Prior to his bypass his legs hurt and he did not get much movement. Now his legs feel weak, but no pain.  Current Medications: ezetimibe '10mg'$  daily Previous medications: rosuvastatin '20mg'$  (elbow pain), rosuvastatin '40mg'$  (shoulder pain) Risk Factors: PAD LDL goal: <70 Apo B goal: < 80  Exercise: yard work  Family History:  Family History  Problem Relation Age of Onset   Kidney cancer Mother     Social History:   Labs:01/13/22 TC 219, TG 156, HDL 42, LDL-C 149 (no medication)  Past Medical History:  Diagnosis Date   Alcohol use    6-7 beers/day (as of 01/21/21)   Hypertension    PAD (peripheral artery disease) (HCC)    Tobacco abuse     Current Outpatient Medications on File Prior to Visit  Medication Sig Dispense Refill   aspirin EC 81 MG tablet Take 81 mg by mouth daily. Swallow whole.     ezetimibe (ZETIA) 10 MG tablet Take 1 tablet (10 mg total) by mouth daily. 90 tablet 3   gabapentin (NEURONTIN) 300 MG capsule  Take 1 capsule (300 mg total) by mouth 3 (three) times daily. 90 capsule 3   hydrochlorothiazide (MICROZIDE) 12.5 MG capsule Take 1 capsule (12.5 mg total) by mouth daily. 30 capsule 5   hydroxyurea (HYDREA) 500 MG capsule Take 1 capsule (500 mg total) by mouth daily. May take with food to minimize GI side effects. 60 capsule 3   Multiple Vitamin (MULTIVITAMIN WITH MINERALS) TABS tablet Take 1 tablet by mouth daily.     polyethylene glycol (MIRALAX) 17 g packet Take 17 g by mouth daily. 30 each 2   No current facility-administered medications on file prior to visit.    Allergies  Allergen Reactions   Penicillins Rash    Assessment/Plan:  1. Hyperlipidemia - LDL-C is above goal of <70. Since patient's symptoms did not improve off of all cholesterol medication for 8 weeks, both patient and I have agreed that his symptoms are not being caused by rosuvastatin. Will resume rosuvastatin at '20mg'$  daily. He will continue ezetimibe '10mg'$  daily. Recheck labs on 11/30. I have encouraged patient to see PCP about his pain and weakness. He may need physical therapy or see rheumatology, but I will leave it to PCP manage.   Thank you,  Ramond Dial, Pharm.D, BCPS, CPP [facility] of [facility] [address] [place], [place] [address]  Phone: [phone]; Fax: [phone]

## 2022-02-12 ENCOUNTER — Ambulatory Visit: Payer: BC Managed Care – PPO | Attending: Interventional Cardiology | Admitting: Pharmacist

## 2022-02-12 DIAGNOSIS — E782 Mixed hyperlipidemia: Secondary | ICD-10-CM

## 2022-02-12 DIAGNOSIS — I739 Peripheral vascular disease, unspecified: Secondary | ICD-10-CM

## 2022-02-12 MED ORDER — ROSUVASTATIN CALCIUM 20 MG PO TABS: 20.0000 mg | ORAL_TABLET | Freq: Every day | ORAL | 3 refills | Status: DC

## 2022-02-12 NOTE — Patient Instructions (Addendum)
Please start taking rosuvastatin '20mg'$  daily (Crestor). Continue taking ezetimibe '10mg'$  daily  Please see your primary care doctor about your shoulder/elbow pain and leg weakness  Please call me at [phone] with any questions   Please come on 11/30 for fasting labs. Lab opens at 7:15 AM

## 2022-02-24 ENCOUNTER — Encounter: Payer: Self-pay | Admitting: Internal Medicine

## 2022-02-24 ENCOUNTER — Ambulatory Visit: Payer: BC Managed Care – PPO | Admitting: Internal Medicine

## 2022-02-24 VITALS — BP 148/75 | HR 79 | Temp 98.1°F | Ht 66.0 in | Wt 154.4 lb

## 2022-02-24 DIAGNOSIS — D473 Essential (hemorrhagic) thrombocythemia: Secondary | ICD-10-CM

## 2022-02-24 DIAGNOSIS — M791 Myalgia, unspecified site: Secondary | ICD-10-CM

## 2022-02-24 DIAGNOSIS — Z1211 Encounter for screening for malignant neoplasm of colon: Secondary | ICD-10-CM

## 2022-02-24 DIAGNOSIS — Z23 Encounter for immunization: Secondary | ICD-10-CM

## 2022-02-24 DIAGNOSIS — Z0000 Encounter for general adult medical examination without abnormal findings: Secondary | ICD-10-CM

## 2022-02-24 DIAGNOSIS — G629 Polyneuropathy, unspecified: Secondary | ICD-10-CM

## 2022-02-24 DIAGNOSIS — Z87891 Personal history of nicotine dependence: Secondary | ICD-10-CM

## 2022-02-24 DIAGNOSIS — I10 Essential (primary) hypertension: Secondary | ICD-10-CM

## 2022-02-24 DIAGNOSIS — T466X5A Adverse effect of antihyperlipidemic and antiarteriosclerotic drugs, initial encounter: Secondary | ICD-10-CM

## 2022-02-24 MED ORDER — GABAPENTIN 300 MG PO CAPS: 600.0000 mg | ORAL_CAPSULE | Freq: Three times a day (TID) | ORAL | 2 refills | Status: DC

## 2022-02-24 MED ORDER — HYDROCHLOROTHIAZIDE 12.5 MG PO CAPS: 12.5000 mg | ORAL_CAPSULE | Freq: Every day | ORAL | 5 refills | Status: DC

## 2022-02-24 NOTE — Assessment & Plan Note (Signed)
Sees Dr. Alen Blew. Found to have JAK2 mutation in 02/2021. 3 month follow ups. Last visit in 01/2022. Next visit in 04/2022. Plt stable at 640s. Pt to continue hydroxyurea 500 mg qd and continue following with oncology.

## 2022-02-24 NOTE — Progress Notes (Deleted)
Pain and weakness   Thrombocythemia Follows with Dr. Alen Blew 01/09/22. CBC 01/09/22 with platelet count of 642, improved form 10 months ago at 890. JAK2 myeloproliferative disorder Hydroxyuria '1000mg'$  3 days a week, 500 mg other days -follow-up in 3 months with hematology  HLD goal <70 Lab Results  Component Value Date   CHOL 219 (H) 01/13/2022   HDL 42 01/13/2022   LDLCALC 149 (H) 01/13/2022   TRIG 156 (H) 01/13/2022   CHOLHDL 5.2 (H) 01/13/2022    Follows with lipid clinic Takign ezetimibe, previously did not tolerate statin. Trialed off rosuvastatin for 8 week and no improvement in symptoms. He agreed to restart rosuvastatin 20 mg.  PAD Prior right aortofemoral bypass 8/22  Flu shot?

## 2022-02-24 NOTE — Assessment & Plan Note (Addendum)
Patient has peripheral neuropathy and takes Gabapentin 300 mg TID. States it is helping some but still having burning pain. The pain is on same side as his graft surgery. Will increase regimen to 600 mg TID and monitor for improvement. Hopefully with time the dose can be decreased. B12 level was checked and it was normal.

## 2022-02-24 NOTE — Assessment & Plan Note (Addendum)
Pt has myalgias that have been on for one year coinciding with the time with the initiation of a statin. He states they got worse when the dose was increased to Crestor 40 mg qd. Pt did endorse moderate relieve with discontinuation of the statin but not resolution. I think pt would benefit from discontinuation of the statin and starting PCSK9 inhibitor. Pt has good strength in all extremities. Very low chance it is arthritis given the sudden onset and symmetrical distribution. Myalgias from statins can last up to 4 months from discontinuation. The dose dependant side effects make this most likely cause of pts symptoms. -Will get pt switched to PCSK9 inhibitor

## 2022-02-24 NOTE — Progress Notes (Signed)
CC: follow up  HPI:  [name] is a 61 y.o. with medical history of HTN, HLD, PAD, COPD, essential thrombocytosis presenting to [facility] for a follow up.   Please see problem-based list for further details, assessments, and plans.  Past Medical History:  Diagnosis Date   Alcohol use    6-7 beers/day (as of 01/21/21)   Aortoiliac occlusive disease ([place]) 02/06/2021   Diverticulosis 02/21/2021   Incidentally notes on CT on 8/3   Hypertension    PAD (peripheral artery disease) (HCC)    Tobacco abuse      Current Outpatient Medications (Cardiovascular):    ezetimibe (ZETIA) 10 MG tablet, Take 1 tablet (10 mg total) by mouth daily.   hydrochlorothiazide (MICROZIDE) 12.5 MG capsule, Take 1 capsule (12.5 mg total) by mouth daily.   rosuvastatin (CRESTOR) 20 MG tablet, Take 1 tablet (20 mg total) by mouth daily.   Current Outpatient Medications (Analgesics):    aspirin EC 81 MG tablet, Take 81 mg by mouth daily. Swallow whole.   Current Outpatient Medications (Other):    gabapentin (NEURONTIN) 300 MG capsule, Take 2 capsules (600 mg total) by mouth 3 (three) times daily.   hydroxyurea (HYDREA) 500 MG capsule, Take 1 capsule (500 mg total) by mouth daily. May take with food to minimize GI side effects.   Multiple Vitamin (MULTIVITAMIN WITH MINERALS) TABS tablet, Take 1 tablet by mouth daily.   polyethylene glycol (MIRALAX) 17 g packet, Take 17 g by mouth daily.  Review of Systems:  Review of system negative unless stated in the problem list or HPI.    Physical Exam:  Vitals:   02/24/22 1607  BP: (!) 148/75  Pulse: 79  Temp: 98.1 F (36.7 C)  TempSrc: Oral  SpO2: 97%  Weight: 154 lb 6.4 oz (70 kg)  Height: '5\' 6"'$  (1.676 m)    Physical Exam General: NAD HENT: NCAT Lungs: CTAB, no wheeze, rhonchi or rales.  Cardiovascular: Normal heart sounds, 2+ pulses in all extremities. No LE edema Abdomen: No TTP, normal bowel sounds MSK: No asymmetry or muscle atrophy.  Skin:  no lesions noted on exposed skin Neuro: Alert and oriented x4. CN grossly intact Psych: Normal mood and normal affect   Assessment & Plan:   Hypertension Has HTN and on HCTZ 12.5 mg qd. BP elevated here at 148/75 and pt does not check BP at home. His BP from other recent OP visits are as follows 130/70, 128/70, 123/79. Cr 0.90 in 07/2021. Will keep regimen same and have pt document his blood pressures.   Essential thrombocytosis (HCC) Sees Dr. Alen Blew. Found to have JAK2 mutation in 02/2021. 3 month follow ups. Last visit in 01/2022. Next visit in 04/2022. Plt stable at 640s. Pt to continue hydroxyurea 500 mg qd and continue following with oncology.   Peripheral neuropathy Patient has peripheral neuropathy and takes Gabapentin 300 mg TID. States it is helping some but still having burning pain. The pain is on same side as his graft surgery. Will increase regimen to 600 mg TID and monitor for improvement. Hopefully with time the dose can be decreased. B12 level was checked and it was normal.   Myalgia due to statin Pt has myalgias that have been on for one year coinciding with the time with the initiation of a statin. He states they got worse when the dose was increased to Crestor 40 mg qd. Pt did endorse moderate relieve with discontinuation of the statin but not resolution. I think pt  would benefit from discontinuation of the statin and starting PCSK9 inhibitor. Pt has good strength in all extremities. Very low chance it is arthritis given the sudden onset and symmetrical distribution. Myalgias from statins can last up to 4 months from discontinuation. The dose dependant side effects make this most likely cause of pts symptoms. -Will get pt switched to PCSK9 inhibitor    See Encounters Tab for problem based charting.  Patient discussed with Dr. Odella Aquas, MD Tillie Rung. [facility] Internal Medicine Residency, PGY-2

## 2022-02-24 NOTE — Patient Instructions (Signed)
Brandon Lara, it was a pleasure seeing you today! You endorsed feeling well today. Below are some of the things we talked about this visit. We look forward to seeing you in the follow up appointment!  Today we discussed: I will refill your medications.  We will work on getting you off of crestor. I will reach out to lipid clinic to help you schedule an appointment.  For your neuropathy, we will increase your gabapentin to 600 mg three times daily.  You can use over the counter voltaren gel or aspercreme as needed for burning pain.   You got a flu shot and gastroenterology referral for a colonoscopy.   I have ordered the following labs today:  Lab Orders  No laboratory test(s) ordered today      Referrals ordered today:    Referral Orders         Ambulatory referral to Gastroenterology      I have ordered the following medication/changed the following medications:   Stop the following medications: Medications Discontinued During This Encounter  Medication Reason   gabapentin (NEURONTIN) 300 MG capsule Reorder   hydrochlorothiazide (MICROZIDE) 12.5 MG capsule Reorder     Start the following medications: Meds ordered this encounter  Medications   hydrochlorothiazide (MICROZIDE) 12.5 MG capsule    Sig: Take 1 capsule (12.5 mg total) by mouth daily.    Dispense:  30 capsule    Refill:  5   gabapentin (NEURONTIN) 300 MG capsule    Sig: Take 2 capsules (600 mg total) by mouth 3 (three) times daily.    Dispense:  180 capsule    Refill:  2     Follow-up: 1 month follow up  Please make sure to arrive 15 minutes prior to your next appointment. If you arrive late, you may be asked to reschedule.   We look forward to seeing you next time. Please call our clinic at [phone] if you have any questions or concerns. The best time to call is Monday-Friday from 9am-4pm, but there is someone available 24/7. If after hours or the weekend, call the main hospital number and ask  for the Internal Medicine Resident On-Call. If you need medication refills, please notify your pharmacy one week in advance and they will send [place] a request.  Thank you for letting us take part in your care. Wishing you the best!  Thank you, Idamae Schuller, MD

## 2022-02-24 NOTE — Assessment & Plan Note (Signed)
Has HTN and on HCTZ 12.5 mg qd. BP elevated here at 148/75 and pt does not check BP at home. His BP from other recent OP visits are as follows 130/70, 128/70, 123/79. Cr 0.90 in 07/2021. Will keep regimen same and have pt document his blood pressures.

## 2022-03-03 ENCOUNTER — Telehealth: Payer: Self-pay | Admitting: Pharmacist

## 2022-03-03 MED ORDER — ATORVASTATIN CALCIUM 10 MG PO TABS: 10.0000 mg | ORAL_TABLET | Freq: Every day | ORAL | 3 refills | Status: DC

## 2022-03-03 NOTE — Telephone Encounter (Signed)
Received a message from patient PCP who thinks patients shoulder pain is from statin. Patient did state that his pain did get a little worse after resume rosuvastatin. He was off of rosuvastatin for 8 weeks and his pain did not completley go away, but did get a little better. I attempted a PA for Repatha, but his insurance requires that he try atorvastatin as well. Spoke with patient who is willing to try atorvastatin. I advised him to stop rosuvastatin and ezetimibe. When he starts to feel better, start atorvastatin '10mg'$  daily. Patient to let me know if he has any issues I will follow up with him in 6 weeks.

## 2022-03-05 ENCOUNTER — Encounter: Payer: Self-pay | Admitting: Gastroenterology

## 2022-03-05 NOTE — Progress Notes (Signed)
Internal Medicine Clinic Attending  Case discussed with Dr. Khan  at the time of the visit.  We reviewed the resident's history and exam and pertinent patient test results.  I agree with the assessment, diagnosis, and plan of care documented in the resident's note.  

## 2022-03-10 ENCOUNTER — Ambulatory Visit (AMBULATORY_SURGERY_CENTER): Payer: Self-pay | Admitting: *Deleted

## 2022-03-10 VITALS — Ht 66.0 in | Wt 155.0 lb

## 2022-03-10 DIAGNOSIS — Z1211 Encounter for screening for malignant neoplasm of colon: Secondary | ICD-10-CM

## 2022-03-10 MED ORDER — NA SULFATE-K SULFATE-MG SULF 17.5-3.13-1.6 GM/177ML PO SOLN: 1.0000 | Freq: Once | ORAL | 0 refills | Status: AC

## 2022-03-10 NOTE — Progress Notes (Signed)
No egg or soy allergy known to patient  No issues known to pt with past sedation with any surgeries or procedures Patient denies ever being told they had issues or difficulty with intubation  No FH of Malignant Hyperthermia Pt is not on diet pills Pt is not on  home 02  Pt is not on blood thinners  Pt denies issues with constipation  No A fib or A flutter Have any cardiac testing pending--no Pt instructed to use Single[employer].com or GoodRx for a price reduction on prep   

## 2022-03-31 ENCOUNTER — Encounter: Payer: Self-pay | Admitting: Gastroenterology

## 2022-04-03 NOTE — Telephone Encounter (Signed)
Patient called to give update on atorvastatin. States that he feels better, but now having trouble with his legs and at nightime. Advised to hold for 1 week and call me with update. We can either re-challenge or see if insurance will now pay for Doyline.

## 2022-04-09 ENCOUNTER — Ambulatory Visit (AMBULATORY_SURGERY_CENTER): Payer: BC Managed Care – PPO | Admitting: Gastroenterology

## 2022-04-09 ENCOUNTER — Encounter: Payer: Self-pay | Admitting: Gastroenterology

## 2022-04-09 VITALS — BP 124/79 | HR 66 | Temp 98.6°F | Resp 15 | Ht 66.0 in | Wt 155.0 lb

## 2022-04-09 DIAGNOSIS — D127 Benign neoplasm of rectosigmoid junction: Secondary | ICD-10-CM

## 2022-04-09 DIAGNOSIS — Z1211 Encounter for screening for malignant neoplasm of colon: Secondary | ICD-10-CM

## 2022-04-09 DIAGNOSIS — D125 Benign neoplasm of sigmoid colon: Secondary | ICD-10-CM

## 2022-04-09 DIAGNOSIS — D123 Benign neoplasm of transverse colon: Secondary | ICD-10-CM

## 2022-04-09 DIAGNOSIS — D128 Benign neoplasm of rectum: Secondary | ICD-10-CM

## 2022-04-09 HISTORY — PX: COLONOSCOPY: SHX174

## 2022-04-09 MED ORDER — SODIUM CHLORIDE 0.9 % IV SOLN: 500.0000 mL | Freq: Once | INTRAVENOUS | Status: DC

## 2022-04-09 NOTE — Op Note (Signed)
[facility] Patient Name: Brandon Lara Procedure Date: 04/09/2022 7:18 AM MRN: [id number] Endoscopist: Mauri Pole , MD, [id number] Age: 61 Referring MD:  Date of Birth: 09/23/1960 Gender: Male Account #: [id number] Procedure:                Colonoscopy Indications:              Screening for colorectal malignant neoplasm Medicines:                Monitored Anesthesia Care Procedure:                Pre-Anesthesia Assessment:                           - Prior to the procedure, a History and Physical                            was performed, and patient medications and                            allergies were reviewed. The patient's tolerance of                            previous anesthesia was also reviewed. The risks                            and benefits of the procedure and the sedation                            options and risks were discussed with the patient.                            All questions were answered, and informed consent                            was obtained. Prior Anticoagulants: The patient has                            taken no anticoagulant or antiplatelet agents. ASA                            Grade Assessment: II - A patient with mild systemic                            disease. After reviewing the risks and benefits,                            the patient was deemed in satisfactory condition to                            undergo the procedure.                           After obtaining informed consent, the colonoscope  was passed under direct vision. Throughout the                            procedure, the patient's blood pressure, pulse, and                            oxygen saturations were monitored continuously. The                            PCF-HQ190L Colonoscope was introduced through the                            anus and advanced to the the cecum, identified by                             appendiceal orifice and ileocecal valve. The                            colonoscopy was performed without difficulty. The                            patient tolerated the procedure well. The quality                            of the bowel preparation was adequate. The                            ileocecal valve, appendiceal orifice, and rectum                            were photographed. Scope In: 8:12:08 AM Scope Out: 8:32:50 AM Scope Withdrawal Time: 0 hours 18 minutes 10 seconds  Total Procedure Duration: 0 hours 20 minutes 42 seconds  Findings:                 The perianal and digital rectal examinations were                            normal.                           A 30 mm polyp was found in the recto-sigmoid colon.                            The polyp was multi-lobulated. The polyp was                            removed with a hot snare. Resection and retrieval                            were complete. Distal fold area was tattooed with                            an injection of 0.5 mL of Spot (carbon black).  Three semi-pedunculated polyps were found in the                            rectum, sigmoid colon and transverse colon. The                            polyps were 8 to 18 mm in size. These polyps were                            removed with a hot snare. Resection and retrieval                            were complete.                           Two sessile polyps were found in the transverse                            colon. The polyps were 4 to 6 mm in size. These                            polyps were removed with a cold snare. Resection                            and retrieval were complete.                           Non-bleeding external and internal hemorrhoids were                            found during retroflexion. The hemorrhoids were                            small. Complications:            No immediate complications. Estimated blood  loss:                            Minimal. Impression:               - One 30 mm polyp at the recto-sigmoid colon,                            removed with a hot snare. Resected and retrieved.                            Tattooed.                           - Three 8 to 18 mm polyps in the rectum, in the                            sigmoid colon and in the transverse colon, removed  with a hot snare. Resected and retrieved.                           - Two 4 to 6 mm polyps in the transverse colon,                            removed with a cold snare. Resected and retrieved.                           - Non-bleeding external and internal hemorrhoids. Recommendation:           - Patient has a contact number available for                            emergencies. The signs and symptoms of potential                            delayed complications were discussed with the                            patient. Return to normal activities tomorrow.                            Written discharge instructions were provided to the                            patient.                           - Resume previous diet.                           - Continue present medications.                           - Await pathology results.                           - Repeat colonoscopy in 1 year for surveillance                            based on pathology results. Mauri Pole, MD 04/09/2022 8:40:07 AM This report has been signed electronically.

## 2022-04-09 NOTE — Progress Notes (Signed)
Pt's states no medical or surgical changes since previsit or office visit. 

## 2022-04-09 NOTE — Progress Notes (Signed)
[facility] History and Physical   Primary Care Physician:  [name], MD   Reason for Procedure:  Colorectal cancer screening  Plan:    Screening colonoscopy with possible interventions as needed     HPI: Brandon Lara is a very pleasant 61 y.o. male here for screening colonoscopy. Denies any nausea, vomiting, abdominal pain, melena or bright red blood per rectum  The risks and benefits as well as alternatives of endoscopic procedure(s) have been discussed and reviewed. All questions answered. The patient agrees to proceed.    Past Medical History:  Diagnosis Date   Alcohol use    6-7 beers/day (as of 01/21/21)   Aortoiliac occlusive disease ([place]) 02/06/2021   Diverticulosis 02/21/2021   Incidentally notes on CT on 8/3   Hyperlipidemia    Hypertension    PAD (peripheral artery disease) ([place])    Tobacco abuse     Past Surgical History:  Procedure Laterality Date   AORTA - BILATERAL FEMORAL ARTERY BYPASS GRAFT Bilateral 02/06/2021   Procedure: AORTA BIFEMORAL BYPASS GRAFT with Omentopexy;  Surgeon: Cherre Robins, MD;  Location: [facility];  Service: Vascular;  Laterality: Bilateral;   COLONOSCOPY  04/09/2022   WISDOM TOOTH EXTRACTION      Prior to Admission medications   Medication Sig Start Date End Date Taking? Authorizing Provider  aspirin EC 81 MG tablet Take 81 mg by mouth daily. Swallow whole.   Yes [provider]  gabapentin (NEURONTIN) 300 MG capsule Take 2 capsules (600 mg total) by mouth 3 (three) times daily. 02/24/22 05/25/22 Yes Idamae Schuller, MD  hydrochlorothiazide (MICROZIDE) 12.5 MG capsule Take 1 capsule (12.5 mg total) by mouth daily. 02/24/22 08/23/22 Yes Idamae Schuller, MD  hydroxyurea (HYDREA) 500 MG capsule Take 1 capsule (500 mg total) by mouth daily. May take with food to minimize GI side effects. 09/18/21  Yes Wyatt Portela, MD  Multiple Vitamin (MULTIVITAMIN WITH MINERALS) TABS tablet Take 1 tablet by mouth daily.  02/16/21   Rhyne, Hulen Shouts, PA-C  polyethylene glycol (MIRALAX) 17 g packet Take 17 g by mouth daily. 03/21/21   Wyatt Portela, MD    Current Outpatient Medications  Medication Sig Dispense Refill   aspirin EC 81 MG tablet Take 81 mg by mouth daily. Swallow whole.     gabapentin (NEURONTIN) 300 MG capsule Take 2 capsules (600 mg total) by mouth 3 (three) times daily. 180 capsule 2   hydrochlorothiazide (MICROZIDE) 12.5 MG capsule Take 1 capsule (12.5 mg total) by mouth daily. 30 capsule 5   hydroxyurea (HYDREA) 500 MG capsule Take 1 capsule (500 mg total) by mouth daily. May take with food to minimize GI side effects. 60 capsule 3   Multiple Vitamin (MULTIVITAMIN WITH MINERALS) TABS tablet Take 1 tablet by mouth daily.     polyethylene glycol (MIRALAX) 17 g packet Take 17 g by mouth daily. 30 each 2   Current Facility-Administered Medications  Medication Dose Route Frequency Provider Last Rate Last Admin   0.9 %  sodium chloride infusion  500 mL Intravenous Once Mauri Pole, MD        Allergies as of 04/09/2022 - Review Complete 04/09/2022  Allergen Reaction Noted   Zetia [ezetimibe] Other (See Comments) 04/09/2022   Crestor [rosuvastatin] Other (See Comments) 04/09/2022   Lipitor [atorvastatin] Other (See Comments) 04/09/2022   Penicillins Rash 01/07/2021    Family History  Problem Relation Age of Onset   Kidney cancer Mother    Colon cancer Neg  Hx    Stomach cancer Neg Hx    Esophageal cancer Neg Hx     Social History   Socioeconomic History   Marital status: Single    Spouse name: Not on file   Number of children: Not on file   Years of education: Not on file   Highest education level: Not on file  Occupational History   Not on file  Tobacco Use   Smoking status: Former    Packs/day: 1.00    Types: Cigarettes    Quit date: 01/22/2021    Years since quitting: 1.2   Smokeless tobacco: Never   Tobacco comments:           Vaping Use   Vaping Use: Never  used  Substance and Sexual Activity   Alcohol use: Yes    Alcohol/week: 4.0 standard drinks of alcohol    Types: 4 Cans of beer per week    Comment: Beer sometimes (cut back on alcohol since 2022 surgery)   Drug use: Never   Sexual activity: Not on file  Other Topics Concern   Not on file  Social History Narrative   Not on file   Social Determinants of Health   Financial Resource Strain: Not on file  Food Insecurity: Not on file  Transportation Needs: Not on file  Physical Activity: Not on file  Stress: Not on file  Social Connections: Not on file  Intimate Partner Violence: Not on file    Review of Systems:  All other review of systems negative except as mentioned in the HPI.  Physical Exam: Vital signs in last 24 hours: Blood Pressure 124/74   Pulse 78   Temperature 98.6 F (37 C) (Temporal)   Height '5\' 6"'$  (1.676 m)   Weight 155 lb (70.3 kg)   Oxygen Saturation 98%   Body Mass Index 25.02 kg/m  General:   Alert, NAD Lungs:  Clear .   Heart:  Regular rate and rhythm Abdomen:  Soft, nontender and nondistended. Neuro/Psych:  Alert and cooperative. Normal mood and affect. A and O x 3  Reviewed labs, radiology imaging, old records and pertinent past GI work up  Patient is appropriate for planned procedure(s) and anesthesia in an ambulatory setting   K. Denzil Magnuson , MD [phone]

## 2022-04-09 NOTE — Patient Instructions (Addendum)
YOU HAD AN ENDOSCOPIC PROCEDURE TODAY AT [facility]:   Refer to the procedure report that was given to you for any specific questions about what was found during the examination.  If the procedure report does not answer your questions, please call your gastroenterologist to clarify.  If you requested that your care partner not be given the details of your procedure findings, then the procedure report has been included in a sealed envelope for you to review at your convenience later.  YOU SHOULD EXPECT: Some feelings of bloating in the abdomen. Passage of more gas than usual.  Walking can help get rid of the air that was put into your GI tract during the procedure and reduce the bloating. If you had a lower endoscopy (such as a colonoscopy or flexible sigmoidoscopy) you may notice spotting of blood in your stool or on the toilet paper. If you underwent a bowel prep for your procedure, you may not have a normal bowel movement for a few days.  Please Note:  You might notice some irritation and congestion in your nose or some drainage.  This is from the oxygen used during your procedure.  There is no need for concern and it should clear up in a day or so.  SYMPTOMS TO REPORT IMMEDIATELY:  Following lower endoscopy (colonoscopy or flexible sigmoidoscopy):  Excessive amounts of blood in the stool  Significant tenderness or worsening of abdominal pains  Swelling of the abdomen that is new, acute  Fever of 100F or higher   For urgent or emergent issues, a gastroenterologist can be reached at any hour by calling [phone]. Do not use MyChart messaging for urgent concerns.    DIET:  We do recommend a small meal at first, but then you may proceed to your regular diet.  Drink plenty of fluids but you should avoid alcoholic beverages for 24 hours.  MEDICATIONS: Continue present medications.  Please see handouts given to you by your recovery nurse: Polyps and hemorrhoids  FOLLOW  UP: Repeat colonoscopy in 1 year for surveillance based on pathology results.  Thank you for allowing [place] to provide for your healthcare needs today.   ACTIVITY:  You should plan to take it easy for the rest of today and you should NOT DRIVE or use heavy machinery until tomorrow (because of the sedation medicines used during the test).    FOLLOW UP: Our staff will call the number listed on your records the next business day following your procedure.  We will call around 7:15- 8:00 am to check on you and address any questions or concerns that you may have regarding the information given to you following your procedure. If we do not reach you, we will leave a message.     If any biopsies were taken you will be contacted by phone or by letter within the next 1-3 weeks.  Please call us at [phone] if you have not heard about the biopsies in 3 weeks.    SIGNATURES/CONFIDENTIALITY: You and/or your care partner have signed paperwork which will be entered into your electronic medical record.  These signatures attest to the fact that that the information above on your After Visit Summary has been reviewed and is understood.  Full responsibility of the confidentiality of this discharge information lies with you and/or your care-partner.

## 2022-04-09 NOTE — Progress Notes (Signed)
Sedate, gd SR, tolerated procedure well, VSS, report to RN 

## 2022-04-10 ENCOUNTER — Telehealth: Payer: Self-pay | Admitting: *Deleted

## 2022-04-10 NOTE — Telephone Encounter (Signed)
Attempted to call patient for their post-procedure follow-up call. No answer. Left voicemail.   

## 2022-04-13 MED ORDER — REPATHA SURECLICK 140 MG/ML ~~LOC~~ SOAJ: 1.0000 mL | SUBCUTANEOUS | 11 refills | Status: DC

## 2022-04-13 NOTE — Addendum Note (Signed)
Addended by: Marcelle Overlie D on: 04/13/2022 03:53 PM   Modules accepted: Orders

## 2022-04-13 NOTE — Telephone Encounter (Signed)
PA approved through 04/12/23. Patient made aware. Adivsed to go to [email or web address] card for copay card. Labs moved to 1/15.

## 2022-04-13 NOTE — Telephone Encounter (Signed)
Patient called stating that he feels much better off atorvastatin. Leg pain is gone and shoulder pain is much better than when on rosuvastatin. Will submit PA for Repatha.

## 2022-04-15 ENCOUNTER — Telehealth: Payer: Self-pay

## 2022-04-15 NOTE — Telephone Encounter (Signed)
Cover My Meds requesting a prior authorization for Repatha SureClick 155 mg/ml auto-injectors. Key: [id number]   please address

## 2022-04-16 ENCOUNTER — Inpatient Hospital Stay: Payer: BC Managed Care – PPO | Attending: Oncology

## 2022-04-16 ENCOUNTER — Other Ambulatory Visit: Payer: Self-pay

## 2022-04-16 ENCOUNTER — Inpatient Hospital Stay: Payer: BC Managed Care – PPO | Admitting: Oncology

## 2022-04-16 VITALS — BP 143/88 | HR 82 | Temp 98.1°F | Resp 16 | Wt 156.7 lb

## 2022-04-16 DIAGNOSIS — D473 Essential (hemorrhagic) thrombocythemia: Secondary | ICD-10-CM

## 2022-04-16 DIAGNOSIS — D72829 Elevated white blood cell count, unspecified: Secondary | ICD-10-CM

## 2022-04-16 DIAGNOSIS — Z880 Allergy status to penicillin: Secondary | ICD-10-CM | POA: Insufficient documentation

## 2022-04-16 DIAGNOSIS — D75839 Thrombocytosis, unspecified: Secondary | ICD-10-CM | POA: Insufficient documentation

## 2022-04-16 DIAGNOSIS — Z79899 Other long term (current) drug therapy: Secondary | ICD-10-CM | POA: Diagnosis not present

## 2022-04-16 DIAGNOSIS — D471 Chronic myeloproliferative disease: Secondary | ICD-10-CM | POA: Diagnosis not present

## 2022-04-16 LAB — CBC WITH DIFFERENTIAL (CANCER CENTER ONLY)
Abs Immature Granulocytes: 0.44 10*3/uL — ABNORMAL HIGH (ref 0.00–0.07)
Basophils Absolute: 0.2 10*3/uL — ABNORMAL HIGH (ref 0.0–0.1)
Basophils Relative: 1 %
Eosinophils Absolute: 0.3 10*3/uL (ref 0.0–0.5)
Eosinophils Relative: 2 %
HCT: 41.2 % (ref 39.0–52.0)
Hemoglobin: 13.8 g/dL (ref 13.0–17.0)
Immature Granulocytes: 3 %
Lymphocytes Relative: 23 %
Lymphs Abs: 3.4 10*3/uL (ref 0.7–4.0)
MCH: 35.5 pg — ABNORMAL HIGH (ref 26.0–34.0)
MCHC: 33.5 g/dL (ref 30.0–36.0)
MCV: 105.9 fL — ABNORMAL HIGH (ref 80.0–100.0)
Monocytes Absolute: 1.4 10*3/uL — ABNORMAL HIGH (ref 0.1–1.0)
Monocytes Relative: 9 %
Neutro Abs: 9.3 10*3/uL — ABNORMAL HIGH (ref 1.7–7.7)
Neutrophils Relative %: 62 %
Platelet Count: 779 10*3/uL — ABNORMAL HIGH (ref 150–400)
RBC: 3.89 MIL/uL — ABNORMAL LOW (ref 4.22–5.81)
RDW: 13.2 % (ref 11.5–15.5)
WBC Count: 15 10*3/uL — ABNORMAL HIGH (ref 4.0–10.5)
nRBC: 0 % (ref 0.0–0.2)

## 2022-04-16 MED ORDER — HYDROXYUREA 500 MG PO CAPS: 500.0000 mg | ORAL_CAPSULE | Freq: Every day | ORAL | 6 refills | Status: DC

## 2022-04-16 NOTE — Addendum Note (Signed)
Addended by: Wyatt Portela on: 04/16/2022 02:39 PM   Modules accepted: Orders

## 2022-04-16 NOTE — Progress Notes (Signed)
Hematology and Oncology Follow Up Visit  Brandon Lara [id number] [birth year] 61 y.o. 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  He presented with thrombocythemia and no evidence of any thrombosis.    Current therapy: Hydroxyurea 500 mg daily started in September 2022.  His dose was increased to 1000 mg 3 days a week and 500 mg daily 4 days a week in November 2022.  Interim History: Brandon Lara returns today for repeat evaluation.  Since the last visit, he reports no major changes in his health.  He has tolerated hydroxyurea at the current dose reasonably well.  He did report that he has been erratic in taking hydroxyurea in the last few weeks which could result in increased counts.  He did have a colonoscopy but no hematochezia, melena or hemoptysis.  He denies any thrombosis or bleeding episodes.      Medications: Reviewed without changes. Current Outpatient Medications  Medication Sig Dispense Refill   aspirin EC 81 MG tablet Take 81 mg by mouth daily. Swallow whole.     Evolocumab (REPATHA SURECLICK) 825 MG/ML SOAJ Inject 140 mg into the skin every 14 (fourteen) days. 2 mL 11   gabapentin (NEURONTIN) 300 MG capsule Take 2 capsules (600 mg total) by mouth 3 (three) times daily. 180 capsule 2   hydrochlorothiazide (MICROZIDE) 12.5 MG capsule Take 1 capsule (12.5 mg total) by mouth daily. 30 capsule 5   hydroxyurea (HYDREA) 500 MG capsule Take 1 capsule (500 mg total) by mouth daily. May take with food to minimize GI side effects. 60 capsule 3   Multiple Vitamin (MULTIVITAMIN WITH MINERALS) TABS tablet Take 1 tablet by mouth daily.     polyethylene glycol (MIRALAX) 17 g packet Take 17 g by mouth daily. 30 each 2   Current Facility-Administered Medications  Medication Dose Route Frequency Provider Last Rate Last Admin   0.9 %  sodium chloride infusion  500 mL  Intravenous Once Brandon Lara, Brandon Minks, MD         Allergies:  Allergies  Allergen Reactions   Zetia [Ezetimibe] Other (See Comments)    Pain, aching joints/muscles   Crestor [Rosuvastatin] Other (See Comments)    Pain, aching joints/muscles   Lipitor [Atorvastatin] Other (See Comments)    Pain, aching joints/muscles   Penicillins Rash      Physical Exam:  Blood pressure (!) 143/88, pulse 82, temperature 98.1 F (36.7 C), temperature source Temporal, resp. rate 16, weight 156 lb 11.2 oz (71.1 kg), SpO2 99 %.   ECOG:  1   General appearance: Alert, awake without any distress. Head: Atraumatic without abnormalities Oropharynx: Without any thrush or ulcers. Eyes: No scleral icterus. Lymph nodes: No lymphadenopathy noted in the cervical, supraclavicular, or axillary nodes Heart:regular rate and rhythm, without any murmurs or gallops.   Lung: Clear to auscultation without any rhonchi, wheezes or dullness to percussion. Abdomin: Soft, nontender without any shifting dullness or ascites. Musculoskeletal: No clubbing or cyanosis. Neurological: No motor or sensory deficits. Skin: No rashes or lesions.          Lab Results: Lab Results  Component Value Date   WBC 15.0 (H) 04/16/2022   HGB 13.8 04/16/2022   HCT 41.2 04/16/2022   MCV 105.9 (H) 04/16/2022   PLT 779 (H) 04/16/2022     Chemistry      Component Value Date/Time   NA 140 07/21/2021 1107   K 4.2  07/21/2021 1107   CL 95 (L) 07/21/2021 1107   CO2 29 07/21/2021 1107   BUN 12 07/21/2021 1107   CREATININE 0.90 07/21/2021 1107   CREATININE 0.84 02/26/2021 1420      Component Value Date/Time   CALCIUM 10.1 07/21/2021 1107   ALKPHOS 105 07/21/2021 1107   AST 43 (H) 07/21/2021 1107   AST 28 02/26/2021 1420   ALT 22 01/13/2022 0935   ALT 38 02/26/2021 1420   BILITOT 0.3 07/21/2021 1107   BILITOT 0.3 02/26/2021 1420         Impression and Plan:   61 year old with:  1.  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  He was found to have JAK2 positive disease.   The natural course of this disease and treatment options were discussed.  Laboratory data from today were reviewed and showed elevation in his platelet count.  This could be a result of lack of compliance but could also possible need for hydroxyurea adjustment.  After discussion today the risk of thrombosis is low and we opted to continue with the same dose and schedule and repeat counts in 3 months.  Increasing the hydroxyurea dose may be required if his platelets continues to go up.  He understands the importance of adherence to the current regimen.   2.  Thrombosis prophylaxis: Risk of thrombosis remains low at this time.  His white cell count is under reasonable control.     3.  Follow-up: He will return in 3 months for a follow-up.     30 minutes were spent on this encounter.  The time was dedicated to reviewing laboratory data, disease status update and outlining future plan of care review.  Zola Button, MD 11/9/202310:50 AM

## 2022-04-16 NOTE — Telephone Encounter (Signed)
PA approved through 04/15/23

## 2022-04-27 ENCOUNTER — Encounter: Payer: Self-pay | Admitting: Gastroenterology

## 2022-05-07 ENCOUNTER — Other Ambulatory Visit: Payer: BC Managed Care – PPO

## 2022-05-12 ENCOUNTER — Ambulatory Visit (AMBULATORY_SURGERY_CENTER): Payer: BC Managed Care – PPO

## 2022-05-12 VITALS — Ht 66.0 in | Wt 157.0 lb

## 2022-05-12 DIAGNOSIS — Z85038 Personal history of other malignant neoplasm of large intestine: Secondary | ICD-10-CM

## 2022-05-12 MED ORDER — NA SULFATE-K SULFATE-MG SULF 17.5-3.13-1.6 GM/177ML PO SOLN: 1.0000 | Freq: Once | ORAL | 0 refills | Status: AC

## 2022-05-12 NOTE — Progress Notes (Signed)
Pre visit completed via phone call; Patient verified name, DOB, and address;  No egg or soy allergy known to patient  No issues known to pt with past sedation with any surgeries or procedures Patient denies ever being told they had issues or difficulty with intubation  No FH of Malignant Hyperthermia Pt is not on diet pills Pt is not on home 02  Pt is not on blood thinners  Pt denies issues with constipation  No A fib or A flutter Have any cardiac testing pending--NO Pt instructed to use Singlecare.com or GoodRx for a price reduction on prep   Insurance verified during [employer] appt=[employer]  Patient's chart reviewed by Osvaldo Angst CNRA prior to previsit and patient appropriate for the [facility].  Previsit completed and red dot placed by patient's name on their procedure day (on provider's schedule).    Patient requested for instructions be sent to MyChart;

## 2022-05-14 ENCOUNTER — Ambulatory Visit (HOSPITAL_COMMUNITY)
Admission: RE | Admit: 2022-05-14 | Discharge: 2022-05-14 | Disposition: A | Discharge status: Home / Self Care | Payer: BC Managed Care – PPO | Source: Ambulatory Visit | Attending: Internal Medicine | Admitting: Internal Medicine

## 2022-05-14 DIAGNOSIS — I739 Peripheral vascular disease, unspecified: Secondary | ICD-10-CM

## 2022-05-14 DIAGNOSIS — I70219 Atherosclerosis of native arteries of extremities with intermittent claudication, unspecified extremity: Secondary | ICD-10-CM

## 2022-05-14 DIAGNOSIS — I6523 Occlusion and stenosis of bilateral carotid arteries: Secondary | ICD-10-CM | POA: Diagnosis not present

## 2022-05-14 DIAGNOSIS — Z79899 Other long term (current) drug therapy: Secondary | ICD-10-CM | POA: Diagnosis not present

## 2022-05-26 ENCOUNTER — Encounter: Payer: Self-pay | Admitting: Gastroenterology

## 2022-05-27 ENCOUNTER — Ambulatory Visit (AMBULATORY_SURGERY_CENTER): Payer: BC Managed Care – PPO | Admitting: Gastroenterology

## 2022-05-27 ENCOUNTER — Encounter: Payer: Self-pay | Admitting: Gastroenterology

## 2022-05-27 VITALS — BP 113/73 | HR 84 | Temp 99.3°F | Resp 10 | Ht 66.0 in | Wt 157.0 lb

## 2022-05-27 DIAGNOSIS — Z08 Encounter for follow-up examination after completed treatment for malignant neoplasm: Secondary | ICD-10-CM

## 2022-05-27 DIAGNOSIS — Z85038 Personal history of other malignant neoplasm of large intestine: Secondary | ICD-10-CM

## 2022-05-27 MED ORDER — SODIUM CHLORIDE 0.9 % IV SOLN: 500.0000 mL | Freq: Once | INTRAVENOUS | Status: DC

## 2022-05-27 NOTE — Progress Notes (Signed)
Report to pacu rn. Vss. Care resumed by rn. 

## 2022-05-27 NOTE — Patient Instructions (Signed)
Handout provided on diverticulosis.   Resume previous diet. Continue present medications.  Repeat colonoscopy in 1 year for surveillance.   YOU HAD AN ENDOSCOPIC PROCEDURE TODAY AT [facility]:   Refer to the procedure report that was given to you for any specific questions about what was found during the examination.  If the procedure report does not answer your questions, please call your gastroenterologist to clarify.  If you requested that your care partner not be given the details of your procedure findings, then the procedure report has been included in a sealed envelope for you to review at your convenience later.  YOU SHOULD EXPECT: Some feelings of bloating in the abdomen. Passage of more gas than usual.  Walking can help get rid of the air that was put into your GI tract during the procedure and reduce the bloating. If you had a lower endoscopy (such as a colonoscopy or flexible sigmoidoscopy) you may notice spotting of blood in your stool or on the toilet paper. If you underwent a bowel prep for your procedure, you may not have a normal bowel movement for a few days.  Please Note:  You might notice some irritation and congestion in your nose or some drainage.  This is from the oxygen used during your procedure.  There is no need for concern and it should clear up in a day or so.  SYMPTOMS TO REPORT IMMEDIATELY:  Following lower endoscopy (colonoscopy or flexible sigmoidoscopy):  Excessive amounts of blood in the stool  Significant tenderness or worsening of abdominal pains  Swelling of the abdomen that is new, acute  Fever of 100F or higher  For urgent or emergent issues, a gastroenterologist can be reached at any hour by calling [phone]. Do not use MyChart messaging for urgent concerns.    DIET:  We do recommend a small meal at first, but then you may proceed to your regular diet.  Drink plenty of fluids but you should avoid alcoholic beverages for 24  hours.  ACTIVITY:  You should plan to take it easy for the rest of today and you should NOT DRIVE or use heavy machinery until tomorrow (because of the sedation medicines used during the test).    FOLLOW UP: Our staff will call the number listed on your records the next business day following your procedure.  We will call around 7:15- 8:00 am to check on you and address any questions or concerns that you may have regarding the information given to you following your procedure. If we do not reach you, we will leave a message.     If any biopsies were taken you will be contacted by phone or by letter within the next 1-3 weeks.  Please call us at [phone] if you have not heard about the biopsies in 3 weeks.    SIGNATURES/CONFIDENTIALITY: You and/or your care partner have signed paperwork which will be entered into your electronic medical record.  These signatures attest to the fact that that the information above on your After Visit Summary has been reviewed and is understood.  Full responsibility of the confidentiality of this discharge information lies with you and/or your care-partner.

## 2022-05-27 NOTE — Op Note (Signed)
[facility] Patient Name: Brandon Lara Procedure Date: 05/27/2022 3:28 PM MRN: [id number] Endoscopist: Mauri Pole , MD, [id number] Age: 61 Referring MD:  Date of Birth: December 14, 1960 Gender: Male Account #: [id number] Procedure:                Colonoscopy Indications:              High risk colon cancer surveillance: Personal                            history of adenoma (10 mm or greater in size), High                            risk colon cancer surveillance: Personal history of                            adenoma with high grade dysplasia, High risk colon                            cancer surveillance: Personal history of multiple                            (3 or more) adenomas, High risk colon cancer                            surveillance: Personal history of colon cancer Medicines:                Monitored Anesthesia Care Procedure:                Pre-Anesthesia Assessment:                           - Prior to the procedure, a History and Physical                            was performed, and patient medications and                            allergies were reviewed. The patient's tolerance of                            previous anesthesia was also reviewed. The risks                            and benefits of the procedure and the sedation                            options and risks were discussed with the patient.                            All questions were answered, and informed consent                            was obtained. Prior Anticoagulants: The patient has  taken no anticoagulant or antiplatelet agents. ASA                            Grade Assessment: II - A patient with mild systemic                            disease. After reviewing the risks and benefits,                            the patient was deemed in satisfactory condition to                            undergo the procedure.                           After  obtaining informed consent, the colonoscope                            was passed under direct vision. Throughout the                            procedure, the patient's blood pressure, pulse, and                            oxygen saturations were monitored continuously. The                            PCF-HQ190L Colonoscope was introduced through the                            anus and advanced to the the cecum, identified by                            appendiceal orifice and ileocecal valve. The                            colonoscopy was performed without difficulty. The                            patient tolerated the procedure well. The quality                            of the bowel preparation was adequate. The                            ileocecal valve, appendiceal orifice, and rectum                            were photographed. Scope In: 3:41:25 PM Scope Out: 3:49:16 PM Scope Withdrawal Time: 0 hours 5 minutes 20 seconds  Total Procedure Duration: 0 hours 7 minutes 51 seconds  Findings:                 The perianal and digital rectal examinations were  normal.                           A less than 5 mm post polypectomy scar was found in                            the recto-sigmoid colon. There was no evidence of                            the previous polyp.                           A tattoo was seen in the recto-sigmoid colon. A                            post-polypectomy scar was found at the tattoo site.                            There was no evidence of residual polyp tissue.                           Scattered small-mouthed diverticula were found in                            the sigmoid colon and descending colon.                           The exam was otherwise without abnormality. Complications:            No immediate complications. Estimated Blood Loss:     Estimated blood loss was minimal. Impression:               - Post-polypectomy scar in  the recto-sigmoid colon.                           - A tattoo was seen in the recto-sigmoid colon. A                            post-polypectomy scar was found at the tattoo site.                            There was no evidence of residual polyp tissue.                           - Diverticulosis in the sigmoid colon and in the                            descending colon.                           - The examination was otherwise normal.                           - No specimens collected. Recommendation:           - Resume  previous diet.                           - Continue present medications.                           - Repeat colonoscopy in 1 year for surveillance. Mauri Pole, MD 05/27/2022 3:56:19 PM This report has been signed electronically.

## 2022-05-27 NOTE — Progress Notes (Signed)
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 male here for surveillance colonoscopy, removal of any residual polyp and marking of the site. Denies any nausea, vomiting, abdominal pain, melena or bright red blood per rectum  The risks and benefits as well as alternatives of endoscopic procedure(s) have been discussed and reviewed. All questions answered. The patient agrees to proceed.    Past Medical History:  Diagnosis Date   Alcohol use    6-7 beers/day (as of 01/21/21)   Aortoiliac occlusive disease ([facility]) 02/06/2021   Blood transfusion without reported diagnosis 2022   Diverticulosis 02/21/2021   Incidentally notes on CT on 8/3   GERD (gastroesophageal reflux disease)    OTC PRN meds   Hyperlipidemia    Hypertension    PAD (peripheral artery disease) (HCC)    Tobacco abuse     Past Surgical History:  Procedure Laterality Date   AORTA - BILATERAL FEMORAL ARTERY BYPASS GRAFT Bilateral 02/06/2021   Procedure: AORTA BIFEMORAL BYPASS GRAFT with Omentopexy;  Surgeon: Cherre Robins, MD;  Location: [facility];  Service: Vascular;  Laterality: Bilateral;   COLONOSCOPY  04/09/2022   KN-MAC-prep adeq-int hems/TA x5/tubulovillous adenoma   WISDOM TOOTH EXTRACTION      Prior to Admission medications   Medication Sig Start Date End Date Taking? Authorizing Provider  aspirin EC 81 MG tablet Take 81 mg by mouth daily. Swallow whole.   Yes [provider]  hydrochlorothiazide (MICROZIDE) 12.5 MG capsule Take 1 capsule (12.5 mg total) by mouth daily. 02/24/22 08/23/22 Yes Idamae Schuller, MD  hydroxyurea (HYDREA) 500 MG capsule Take 1 capsule (500 mg total) by mouth daily. May take with food to minimize GI side effects.  04/16/22  Yes Wyatt Portela, MD  Multiple Vitamin (MULTIVITAMIN WITH MINERALS) TABS tablet Take 1 tablet by mouth daily. 02/16/21  Yes Rhyne, Samantha J, PA-C  Evolocumab (REPATHA SURECLICK) 956 MG/ML SOAJ Inject 140 mg into the skin every 14 (fourteen) days. 04/13/22   Jerline Pain, MD  gabapentin (NEURONTIN) 300 MG capsule Take 2 capsules (600 mg total) by mouth 3 (three) times daily. 02/24/22 05/25/22  Idamae Schuller, MD  polyethylene glycol (MIRALAX) 17 g packet Take 17 g by mouth daily. Patient not taking: Reported on 05/12/2022 03/21/21   Wyatt Portela, MD    Current Outpatient Medications  Medication Sig Dispense Refill   aspirin EC 81 MG tablet Take 81 mg by mouth daily. Swallow whole.     hydrochlorothiazide (MICROZIDE) 12.5 MG capsule Take 1 capsule (12.5 mg total) by mouth daily. 30 capsule 5   hydroxyurea (HYDREA) 500 MG capsule Take 1 capsule (500 mg total) by mouth daily. May take with food to minimize GI side effects. 60 capsule 6   Multiple Vitamin (MULTIVITAMIN WITH MINERALS) TABS tablet Take 1 tablet by mouth daily.     Evolocumab (REPATHA SURECLICK) 387 MG/ML SOAJ Inject 140 mg into the skin every 14 (fourteen) days. 2 mL 11   gabapentin (NEURONTIN) 300 MG capsule Take 2 capsules (600 mg total) by mouth 3 (three) times daily. 180 capsule 2   polyethylene glycol (MIRALAX) 17 g packet Take 17 g by mouth daily. (Patient not taking: Reported on 05/12/2022) 30 each 2  Current Facility-Administered Medications  Medication Dose Route Frequency Provider Last Rate Last Admin   0.9 %  sodium chloride infusion  500 mL Intravenous Once Mauri Pole, MD        Allergies as of 05/27/2022 - Review Complete 05/27/2022  Allergen Reaction Noted   Zetia [ezetimibe] Other (See Comments) 04/09/2022   Crestor [rosuvastatin] Other (See Comments) 04/09/2022   Lipitor [atorvastatin] Other (See Comments) 04/09/2022   Penicillins Rash 01/07/2021    Family History  Problem Relation Age  of Onset   Kidney cancer Mother    Colon cancer Neg Hx    Stomach cancer Neg Hx    Esophageal cancer Neg Hx     Social History   Socioeconomic History   Marital status: Single    Spouse name: Not on file   Number of children: Not on file   Years of education: Not on file   Highest education level: Not on file  Occupational History   Not on file  Tobacco Use   Smoking status: Former    Packs/day: 1.00    Types: Cigarettes    Quit date: 01/22/2021    Years since quitting: 1.3   Smokeless tobacco: Never   Tobacco comments:           Vaping Use   Vaping Use: Never used  Substance and Sexual Activity   Alcohol use: Yes    Alcohol/week: 4.0 standard drinks of alcohol    Types: 4 Cans of beer per week    Comment: Beer sometimes (cut back on alcohol since 2022 surgery)   Drug use: Never   Sexual activity: Not on file  Other Topics Concern   Not on file  Social History Narrative   Not on file   Social Determinants of Health   Financial Resource Strain: Not on file  Food Insecurity: Not on file  Transportation Needs: Not on file  Physical Activity: Not on file  Stress: Not on file  Social Connections: Not on file  Intimate Partner Violence: Not on file    Review of Systems:  All other review of systems negative except as mentioned in the HPI.  Physical Exam: Vital signs in last 24 hours: Blood Pressure 117/73 (BP Location: Right Arm, Patient Position: Sitting, Cuff Size: Normal)   Pulse 82   Temperature 99.3 F (37.4 C) (Temporal)   Height _0  (1.676 m)   Weight 157 lb (71.2 kg)   Oxygen Saturation 99%   Body Mass Index 25.34 kg/m  General:   Alert, NAD Lungs:  Clear .   Heart:  Regular rate and rhythm Abdomen:  Soft, nontender and nondistended. Neuro/Psych:  Alert and cooperative. Normal mood and affect. A and O x 3  Reviewed labs, radiology imaging, old records and pertinent past GI work up  Patient is appropriate for planned procedure(s) and  anesthesia in an ambulatory setting   K. Denzil Magnuson , MD [phone]

## 2022-05-28 ENCOUNTER — Telehealth: Payer: Self-pay

## 2022-05-28 NOTE — Telephone Encounter (Signed)
  Follow up Call-     05/27/2022    2:42 PM 05/27/2022    2:38 PM 04/09/2022    7:14 AM  Call back number  Post procedure Call Back phone  # [phone]  [phone]  Permission to leave phone message  Yes Yes     Patient questions:  Do you have a fever, pain , or abdominal swelling? No. Pain Score  0 *  Have you tolerated food without any problems? Yes.    Have you been able to return to your normal activities? Yes.    Do you have any questions about your discharge instructions: Diet   No. Medications  No. Follow up visit  No.  Do you have questions or concerns about your Care? No.  Actions: * If pain score is 4 or above: No action needed, pain <4.

## 2022-06-02 ENCOUNTER — Other Ambulatory Visit: Payer: Self-pay | Admitting: Internal Medicine

## 2022-06-02 DIAGNOSIS — G629 Polyneuropathy, unspecified: Secondary | ICD-10-CM

## 2022-06-18 ENCOUNTER — Telehealth: Payer: Self-pay | Admitting: Oncology

## 2022-06-18 NOTE — Telephone Encounter (Signed)
Called patient regarding providers departure and rescheduled February appointment with a new provider.

## 2022-06-22 ENCOUNTER — Ambulatory Visit: Payer: BC Managed Care – PPO | Attending: Internal Medicine

## 2022-06-22 DIAGNOSIS — E782 Mixed hyperlipidemia: Secondary | ICD-10-CM

## 2022-06-22 DIAGNOSIS — I739 Peripheral vascular disease, unspecified: Secondary | ICD-10-CM

## 2022-06-23 LAB — HEPATIC FUNCTION PANEL
ALT: 22 IU/L (ref 0–44)
AST: 20 IU/L (ref 0–40)
Albumin: 4.4 g/dL (ref 3.8–4.9)
Alkaline Phosphatase: 115 IU/L (ref 44–121)
Bilirubin Total: 0.5 mg/dL (ref 0.0–1.2)
Bilirubin, Direct: 0.16 mg/dL (ref 0.00–0.40)
Total Protein: 6.5 g/dL (ref 6.0–8.5)

## 2022-06-23 LAB — LIPID PANEL
Chol/HDL Ratio: 2.2 ratio (ref 0.0–5.0)
Cholesterol, Total: 91 mg/dL — ABNORMAL LOW (ref 100–199)
HDL: 41 mg/dL (ref >39)
LDL Chol Calc (NIH): 31 mg/dL (ref 0–99)
Triglycerides: 100 mg/dL (ref 0–149)
VLDL Cholesterol Cal: 19 mg/dL (ref 5–40)

## 2022-06-23 LAB — APOLIPOPROTEIN B: Apolipoprotein B: 42 mg/dL (ref <90)

## 2022-07-02 ENCOUNTER — Other Ambulatory Visit: Payer: Self-pay | Admitting: *Deleted

## 2022-07-02 DIAGNOSIS — I739 Peripheral vascular disease, unspecified: Secondary | ICD-10-CM

## 2022-07-03 ENCOUNTER — Ambulatory Visit (HOSPITAL_COMMUNITY)
Admission: RE | Admit: 2022-07-03 | Discharge: 2022-07-03 | Disposition: A | Discharge status: Home / Self Care | Payer: BC Managed Care – PPO | Source: Ambulatory Visit | Attending: Vascular Surgery | Admitting: Vascular Surgery

## 2022-07-03 DIAGNOSIS — I739 Peripheral vascular disease, unspecified: Secondary | ICD-10-CM | POA: Diagnosis present

## 2022-07-05 LAB — VAS US ABI WITH/WO TBI
Left ABI: 1.09
Right ABI: 1.01

## 2022-07-07 ENCOUNTER — Ambulatory Visit: Payer: BC Managed Care – PPO | Admitting: Vascular Surgery

## 2022-07-07 ENCOUNTER — Encounter: Payer: Self-pay | Admitting: Vascular Surgery

## 2022-07-07 VITALS — BP 114/80 | HR 73 | Temp 98.6°F | Resp 20 | Ht 66.0 in | Wt 161.0 lb

## 2022-07-07 DIAGNOSIS — I739 Peripheral vascular disease, unspecified: Secondary | ICD-10-CM | POA: Diagnosis not present

## 2022-07-07 NOTE — Progress Notes (Signed)
[facility] [place]  ASSESSMENT / PLAN: Brandon Lara is a 62 y.o. 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  His ABI has normalized.  Recommend the following which can slow the progression of atherosclerosis and reduce the risk of major adverse cardiac / limb events:  Complete cessation from all tobacco products. Blood glucose control with goal A1c < 7%. Blood pressure control with goal blood pressure < 140/90 mmHg. Lipid reduction therapy with goal LDL-C <100 mg/dL (<70 if symptomatic from PAD).  Aspirin '81mg'$  PO QD.  Atorvastatin 40-'80mg'$  PO QD (or other "high intensity" statin therapy).  Follow up in 1 year with repeat ABI to monitor PAD.   CHIEF COMPLAINT: Bilateral leg pain  HISTORY OF PRESENT ILLNESS: Brandon Lara is a 62 y.o. male referred to clinic for evaluation of severe right lower extremity pain at rest.  He was seen by Dr. Gladstone Lighter with orthopedics who identified pulseless right leg and referred him for urgent evaluation.  The patient reports progressive symptoms of intermittent claudication which have worsened over the past several months.  He now has pain at rest.  The right leg is worse.  He has no ulceration about his lower extremities.  He is a longtime smoker.  He is otherwise healthy to the best of his knowledge.  03/18/21: Patient returns to clinic for postoperative evaluation he is doing well.  He has intermittent constipation.  He feels this is because of potassium limitation.  He continues to follow-up with internal medicine and hematology for his leukocytosis.  His feet feel improved.  He still has occasional numbness and paresthesia in his right foot, but this seems to be improving.  He is deconditioned.  His appetite has been diminished.  He has lost weight.  Globally, however, he feels improved.  07/07/22: Patient returns to clinic for  surveillance.  He still would ports some radicular and neuropathic type discomfort in his right lower extremity.  He reports pain in his right buttock radiating down the back of the right leg.  He also reports some sensory disturbance in his right foot.  Pain in the buttock is relieved by sitting.  Pain in the buttock is worsened by standing for long period of time.  He has no claudication or rest pain type symptoms.  VASCULAR SURGICAL HISTORY:  Aorto-bi-femoral bypass with 18x33m Dacron graft. Omentopexy performed to cover retroperitoneum.   VASCULAR RISK FACTORS: Negative history of cerebrovascular disease / stroke / transient ischemic attack. Negative history of coronary artery disease.  Negative history of diabetes mellitus.  Positive history of smoking.  He has quit smoking. Negative history of hypertension.  Negative history of chronic kidney disease. Negative history of chronic obstructive pulmonary disease.  AMBULATORY STATUS: Ambulatory within the community without limits   Past medical history: denies Past surgical history: denies Family history: denies Social history: active smoker as above. Works as gChief Financial Officer Medications: none Allergies: NKDA  PHYSICAL EXAM Vitals:   07/07/22 0816  BP: 114/80  Pulse: 73  Resp: 20  Temp: 98.6 F (37 C)  SpO2: 99%  Weight: 161 lb (73 kg)  Height: '5\' 6"'$  (1.676 m)     Constitutional: well appearing. no distress. Appears marginally nourished.  Neurologic: CN intact. no focal findings. no sensory loss. Psychiatric:  Mood and affect symmetric and appropriate. Eyes:  No icterus. No conjunctival pallor. Ears, nose, throat:  mucous membranes moist. Midline trachea.  Cardiac: regular  rate and rhythm.  Respiratory:  unlabored. Abdominal:  soft, non-tender, non-distended.   Peripheral vascular: 2+ femoral pulses. 2+ dorsalis pedis pulses. Extremity: no edema. no cyanosis. no pallor.  Skin: no gangrene. no ulceration.   Lymphatic: no Stemmer's sign. no palpable lymphadenopathy.  PERTINENT LABORATORY AND RADIOLOGIC DATA   +-------+-----------+-----------+------------+------------+  ABI/TBIToday's ABIToday's TBIPrevious ABIPrevious TBI  +-------+-----------+-----------+------------+------------+  Right 1.01       0.67       1.05        0.86          +-------+-----------+-----------+------------+------------+  Left  1.09       0.68       1.22        0.92          +-------+-----------+-----------+------------+------------+   Yevonne Aline. Stanford Breed, MD [facility] Phone Number: [phone] 07/07/2022 8:51 AM

## 2022-07-17 ENCOUNTER — Other Ambulatory Visit: Payer: Self-pay | Admitting: *Deleted

## 2022-07-17 ENCOUNTER — Other Ambulatory Visit: Payer: BC Managed Care – PPO

## 2022-07-17 ENCOUNTER — Ambulatory Visit: Payer: BC Managed Care – PPO | Admitting: Oncology

## 2022-07-17 DIAGNOSIS — D473 Essential (hemorrhagic) thrombocythemia: Secondary | ICD-10-CM

## 2022-07-17 NOTE — Progress Notes (Signed)
   Patient Care Team: [name], MD as PCP - General Werner Lean, MD as PCP - Cardiology (Cardiology)  DIAGNOSIS: No diagnosis found.  SUMMARY OF ONCOLOGIC HISTORY: Oncology History   No history exists.    CHIEF COMPLIANT: Hydroxyurea Establish oncology care with Dr. 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  He presented with thrombocythemia and no evidence of any thrombosis.    ALLERGIES:  is allergic to zetia [ezetimibe], crestor [rosuvastatin], lipitor [atorvastatin], and penicillins.  MEDICATIONS:  Current Outpatient Medications  Medication Sig Dispense Refill   aspirin EC 81 MG tablet Take 81 mg by mouth daily. Swallow whole.     Evolocumab (REPATHA SURECLICK) 865 MG/ML SOAJ Inject 140 mg into the skin every 14 (fourteen) days. 2 mL 11   gabapentin (NEURONTIN) 300 MG capsule Take 2 capsules (600 mg total) by mouth 3 (three) times daily. 180 capsule 2   hydrochlorothiazide (MICROZIDE) 12.5 MG capsule Take 1 capsule (12.5 mg total) by mouth daily. 30 capsule 5   hydroxyurea (HYDREA) 500 MG capsule Take 1 capsule (500 mg total) by mouth daily. May take with food to minimize GI side effects. 60 capsule 6   Multiple Vitamin (MULTIVITAMIN WITH MINERALS) TABS tablet Take 1 tablet by mouth daily.     polyethylene glycol (MIRALAX) 17 g packet Take 17 g by mouth daily. (Patient not taking: Reported on 05/12/2022) 30 each 2   No current facility-administered medications for this visit.    PHYSICAL EXAMINATION: ECOG PERFORMANCE STATUS: {CHL ONC ECOG PS:[id number]}  There were no vitals filed for this visit. There were no vitals filed for this visit.  BREAST:*** No palpable masses or nodules in either right or left breasts. No palpable axillary supraclavicular or infraclavicular adenopathy no breast tenderness or nipple discharge. (exam performed in the presence of a  chaperone)  LABORATORY DATA:  I have reviewed the data as listed    Latest Ref Rng & Units 06/22/2022    8:53 AM 01/13/2022    9:35 AM 08/11/2021    9:24 AM  CMP  Total Protein 6.0 - 8.5 g/dL 6.5     Total Bilirubin 0.0 - 1.2 mg/dL 0.5     Alkaline Phos 44 - 121 IU/L 115     AST 0 - 40 IU/L 20     ALT 0 - 44 IU/L 22  22  41     Lab Results  Component Value Date   WBC 15.0 (H) 04/16/2022   HGB 13.8 04/16/2022   HCT 41.2 04/16/2022   MCV 105.9 (H) 04/16/2022   PLT 779 (H) 04/16/2022   NEUTROABS 9.3 (H) 04/16/2022    ASSESSMENT & PLAN:  No problem-specific Assessment & Plan notes found for this encounter.    No orders of the defined types were placed in this encounter.  The patient has a good understanding of the overall plan. he agrees with it. he will call with any problems that may develop before the next visit here. Total time spent: 30 mins including face to face time and time spent for planning, charting and co-ordination of care   Suzzette Righter, [place] 07/17/22    I Gardiner Coins am acting as a Education administrator for [employer]  ***

## 2022-07-20 ENCOUNTER — Inpatient Hospital Stay: Payer: BC Managed Care – PPO

## 2022-07-20 ENCOUNTER — Other Ambulatory Visit: Payer: Self-pay

## 2022-07-20 ENCOUNTER — Inpatient Hospital Stay: Payer: BC Managed Care – PPO | Attending: Oncology | Admitting: Hematology and Oncology

## 2022-07-20 VITALS — BP 118/81 | HR 81 | Temp 98.1°F | Resp 19 | Wt 162.2 lb

## 2022-07-20 DIAGNOSIS — Z79899 Other long term (current) drug therapy: Secondary | ICD-10-CM | POA: Insufficient documentation

## 2022-07-20 DIAGNOSIS — D473 Essential (hemorrhagic) thrombocythemia: Secondary | ICD-10-CM | POA: Diagnosis not present

## 2022-07-20 DIAGNOSIS — R42 Dizziness and giddiness: Secondary | ICD-10-CM | POA: Diagnosis not present

## 2022-07-20 DIAGNOSIS — Z880 Allergy status to penicillin: Secondary | ICD-10-CM | POA: Diagnosis not present

## 2022-07-20 DIAGNOSIS — D471 Chronic myeloproliferative disease: Secondary | ICD-10-CM | POA: Insufficient documentation

## 2022-07-20 DIAGNOSIS — D75839 Thrombocytosis, unspecified: Secondary | ICD-10-CM | POA: Insufficient documentation

## 2022-07-20 LAB — CMP (CANCER CENTER ONLY)
ALT: 17 U/L (ref 0–44)
AST: 16 U/L (ref 15–41)
Albumin: 4.2 g/dL (ref 3.5–5.0)
Alkaline Phosphatase: 97 U/L (ref 38–126)
Anion gap: 7 (ref 5–15)
BUN: 13 mg/dL (ref 6–20)
CO2: 31 mmol/L (ref 22–32)
Calcium: 8.9 mg/dL (ref 8.9–10.3)
Chloride: 101 mmol/L (ref 98–111)
Creatinine: 0.92 mg/dL (ref 0.61–1.24)
GFR, Estimated: >60 mL/min (ref >60)
Glucose, Bld: 83 mg/dL (ref 70–99)
Potassium: 3.6 mmol/L (ref 3.5–5.1)
Sodium: 139 mmol/L (ref 135–145)
Total Bilirubin: 0.4 mg/dL (ref 0.3–1.2)
Total Protein: 7.1 g/dL (ref 6.5–8.1)

## 2022-07-20 LAB — CBC WITH DIFFERENTIAL (CANCER CENTER ONLY)
Abs Immature Granulocytes: 0.28 K/uL — ABNORMAL HIGH (ref 0.00–0.07)
Basophils Absolute: 0.2 K/uL — ABNORMAL HIGH (ref 0.0–0.1)
Basophils Relative: 1 %
Eosinophils Absolute: 0.3 K/uL (ref 0.0–0.5)
Eosinophils Relative: 2 %
HCT: 39.9 % (ref 39.0–52.0)
Hemoglobin: 13.6 g/dL (ref 13.0–17.0)
Immature Granulocytes: 2 %
Lymphocytes Relative: 21 %
Lymphs Abs: 3.5 K/uL (ref 0.7–4.0)
MCH: 35.8 pg — ABNORMAL HIGH (ref 26.0–34.0)
MCHC: 34.1 g/dL (ref 30.0–36.0)
MCV: 105 fL — ABNORMAL HIGH (ref 80.0–100.0)
Monocytes Absolute: 1.8 K/uL — ABNORMAL HIGH (ref 0.1–1.0)
Monocytes Relative: 10 %
Neutro Abs: 11.2 K/uL — ABNORMAL HIGH (ref 1.7–7.7)
Neutrophils Relative %: 64 %
Platelet Count: 683 K/uL — ABNORMAL HIGH (ref 150–400)
RBC: 3.8 MIL/uL — ABNORMAL LOW (ref 4.22–5.81)
RDW: 13.9 % (ref 11.5–15.5)
WBC Count: 17.3 K/uL — ABNORMAL HIGH (ref 4.0–10.5)
nRBC: 0 % (ref 0.0–0.2)

## 2022-07-20 MED ORDER — B COMPLEX VITAMINS PO CAPS: 1.0000 | ORAL_CAPSULE | Freq: Every day | ORAL | Status: AC

## 2022-07-20 NOTE — Assessment & Plan Note (Signed)
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  He was found to have JAK2 positive disease.  Current treatment: Hydroxyurea 500 mg daily Hydroxyurea toxicities:  Lab review:   Return to clinic in 3 months with labs and follow-up

## 2022-08-10 ENCOUNTER — Ambulatory Visit: Payer: BC Managed Care – PPO

## 2022-08-10 ENCOUNTER — Other Ambulatory Visit: Payer: Self-pay

## 2022-08-10 VITALS — BP 124/78 | HR 87 | Temp 98.1°F | Ht 66.0 in | Wt 160.4 lb

## 2022-08-10 DIAGNOSIS — I700 Atherosclerosis of aorta: Secondary | ICD-10-CM | POA: Diagnosis not present

## 2022-08-10 DIAGNOSIS — I10 Essential (primary) hypertension: Secondary | ICD-10-CM

## 2022-08-10 DIAGNOSIS — J439 Emphysema, unspecified: Secondary | ICD-10-CM | POA: Diagnosis not present

## 2022-08-10 NOTE — Progress Notes (Deleted)
HTN Has HTN and on HCTZ 12.5 mg qd. BP elevated here at 148/75 and pt does not check BP at home. His BP from other recent OP visits are as follows 130/70, 128/70, 123/79. Cr 0.90 in 07/2021. Will keep regimen same and have pt document his blood pressures.    PAD Follows with Dr. Roselie Awkward aorto-bi-femoral bypass 9/1/2  repatha -- Continue ASA 81 mg daily and Crestor 40 mg daily -- Increase gabapentin to 300 mg 3 times a day -F/u LDL 1/15 31, apo B 42  COPD? No treatment PFT?  Essential thrombocythemia JAK2 positive Dr. Lindi Adie, f/u3 months Hydroxyurea 500 Cbc 2/12 platelet 683

## 2022-08-10 NOTE — Patient Instructions (Signed)
Thank you, Mr.Jonh D Wolthuis for allowing [place] to provide your care today.     Follow up: 6 months    We look forward to seeing you next time. Please call our clinic at [phone] if you have any questions or concerns. The best time to call is Monday-Friday from 9am-4pm, but there is someone available 24/7. If after hours or the weekend, call the main hospital number and ask for the Internal Medicine Resident On-Call. If you need medication refills, please notify your pharmacy one week in advance and they will send [place] a request.   Thank you for trusting me with your care. Wishing you the best!   [name], MD [place]

## 2022-08-10 NOTE — Assessment & Plan Note (Signed)
Emphysematous changes noted on CXR 01/2021. Patient has never had PFTs or CT chest. Stopped smoking August 2022, prior to that smoked 1.5-2 ppd for >20 years. Says he only gets a cough once in a blue moon, has no exertional dyspnea. LCTAB, normal wob on exam. Will not get PFTs at this time. Will discuss CT lung cancer screening at next visit. Continue to monitor.

## 2022-08-10 NOTE — Progress Notes (Signed)
   Established Patient Office Visit  Subjective   Patient ID: Brandon Lara, male    DOB: 1960-06-20  Age: 62 y.o. MRN: [id number]  Chief Complaint  Patient presents with   Follow-up    ROUTINE OFFICE VISIT/ CONGESTION AT NIGHT-requesting what to take.    MR. Bulger is a 61 y/o male with a pmh outlined below. Please see A&P for HPI information.      Review of Systems  All other systems reviewed and are negative.     Objective:     BP 124/78 (BP Location: Right Arm, Patient Position: Sitting, Cuff Size: Normal)   Pulse 87   Temp 98.1 F (36.7 C) (Oral)   Ht '5\' 6"'$  (1.676 m)   Wt 160 lb 6.4 oz (72.8 kg)   SpO2 97%   BMI 25.89 kg/m    Physical Exam Constitutional:      General: He is not in acute distress.    Appearance: Normal appearance. He is normal weight.  Eyes:     General: No scleral icterus.    Conjunctiva/sclera: Conjunctivae normal.  Cardiovascular:     Rate and Rhythm: Normal rate and regular rhythm.     Pulses: Normal pulses.     Heart sounds: Normal heart sounds. No murmur heard.    No gallop.  Pulmonary:     Effort: Pulmonary effort is normal. No respiratory distress.     Breath sounds: Normal breath sounds. No wheezing or rales.  Musculoskeletal:     Right lower leg: No edema.  Skin:    General: Skin is warm and dry.  Neurological:     Mental Status: He is alert.      No results found for any visits on 08/10/22.    The ASCVD Risk score (Arnett DK, et al., 2019) failed to calculate for the following reasons:   The valid total cholesterol range is 130 to 320 mg/dL    Assessment & Plan:   Problem List Items Addressed This Visit       Cardiovascular and Mediastinum   Hypertension - Primary    BP today 124/78 and well controlled. Continue HCTZ 12.'5mg'$  daily. CMP 2/12 with normal renal function and electrolytes, no repeat at this time.      Atherosclerosis of aorta ([place])    Most recent LDL 06/22/22 was 31. Patient not able to  tolerate statins. Currently on rapatha. Repeat lipid panel every 6 months to 1 year. Stable and no changes to management at this time.        Respiratory   COPD (chronic obstructive pulmonary disease) (HCC)    Emphysematous changes noted on CXR 01/2021. Patient has never had PFTs or CT chest. Stopped smoking August 2022, prior to that smoked 1.5-2 ppd for >20 years. Says he only gets a cough once in a blue moon, has no exertional dyspnea. LCTAB, normal wob on exam. Will not get PFTs at this time. Will discuss CT lung cancer screening at next visit. Continue to monitor.       Return in about 6 months (around 02/10/2023).    [name], MD

## 2022-08-10 NOTE — Assessment & Plan Note (Signed)
Most recent LDL 06/22/22 was 31. Patient not able to tolerate statins. Currently on rapatha. Repeat lipid panel every 6 months to 1 year. Stable and no changes to management at this time.

## 2022-08-10 NOTE — Assessment & Plan Note (Addendum)
BP today 124/78 and well controlled. Continue HCTZ 12.'5mg'$  daily. CMP 2/12 with normal renal function and electrolytes, no repeat at this time.

## 2022-08-12 NOTE — Progress Notes (Signed)
Internal Medicine Clinic Attending  Case discussed with the resident at the time of the visit.  We reviewed the resident's history and exam and pertinent patient test results.  I agree with the assessment, diagnosis, and plan of care documented in the resident's note.  

## 2022-08-29 IMAGING — CT CT ANGIO AOBIFEM WO/W CM
2 of 6 series · 7 of 46 positions shown, 8 images · IV contrast (omnipaque)
Comparison: None.

CLINICAL DATA: Peripheral arterial disease. Claudication or leg
ischemia. Right leg and foot numbness with pain.

EXAM:
CT ANGIOGRAPHY OF ABDOMINAL AORTA WITH ILIOFEMORAL RUNOFF
TECHNIQUE: Multidetector CT imaging of the abdomen, pelvis and lower
extremities was performed using the standard protocol during bolus
administration of intravenous contrast. Multiplanar CT image
reconstructions and MIPs were obtained to evaluate the vascular
anatomy.
CONTRAST:  100mL OMNIPAQUE IOHEXOL 350 MG/ML SOLN

[Series 4: axial arterial upper · axial · arterial · 0.66mm/px · z∈[-1148,-270]mm · 4 of 441 slices shown, 5 images]
[im 74/441  soft-tissue]
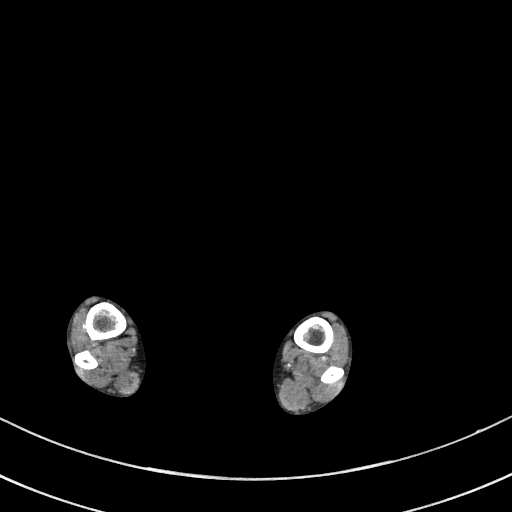
[im 74/441  bone]
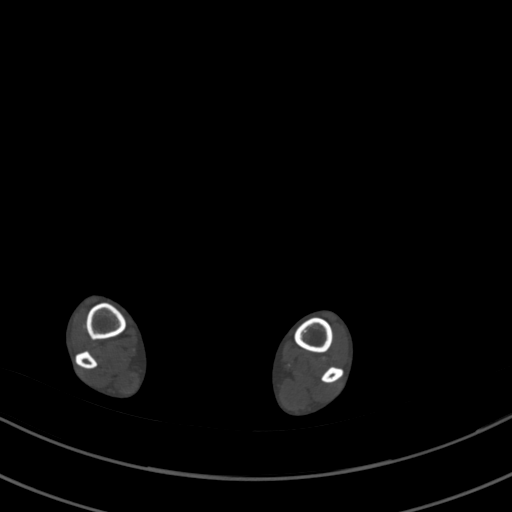
[im 184/441  soft-tissue]
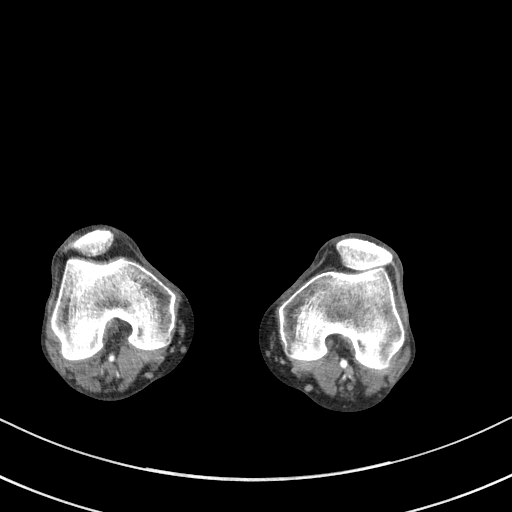
[im 257/441  soft-tissue]
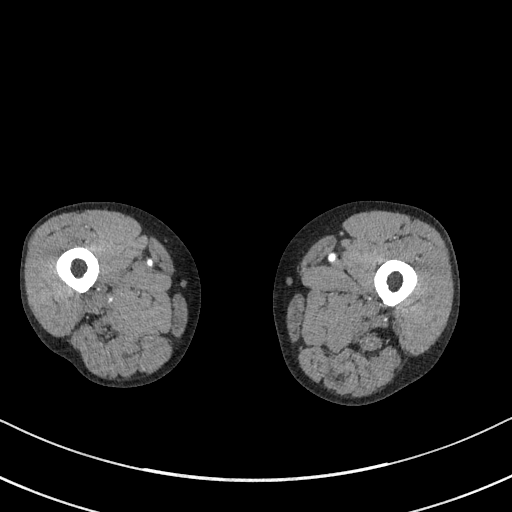
[im 367/441  soft-tissue]
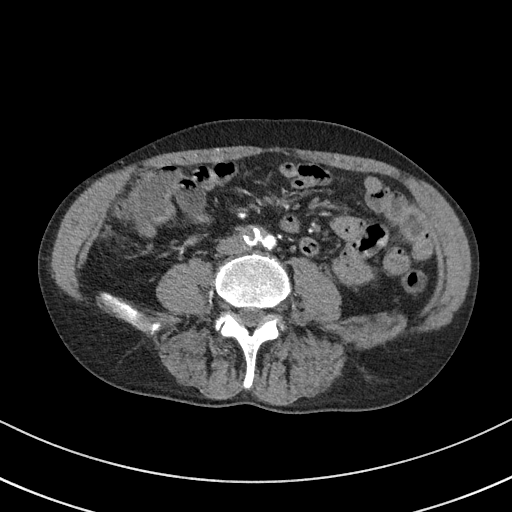

[Series 5: coronal upper · coronal · 0.69mm/px · 3 of 97 slices shown]
[im 25/97  soft-tissue]
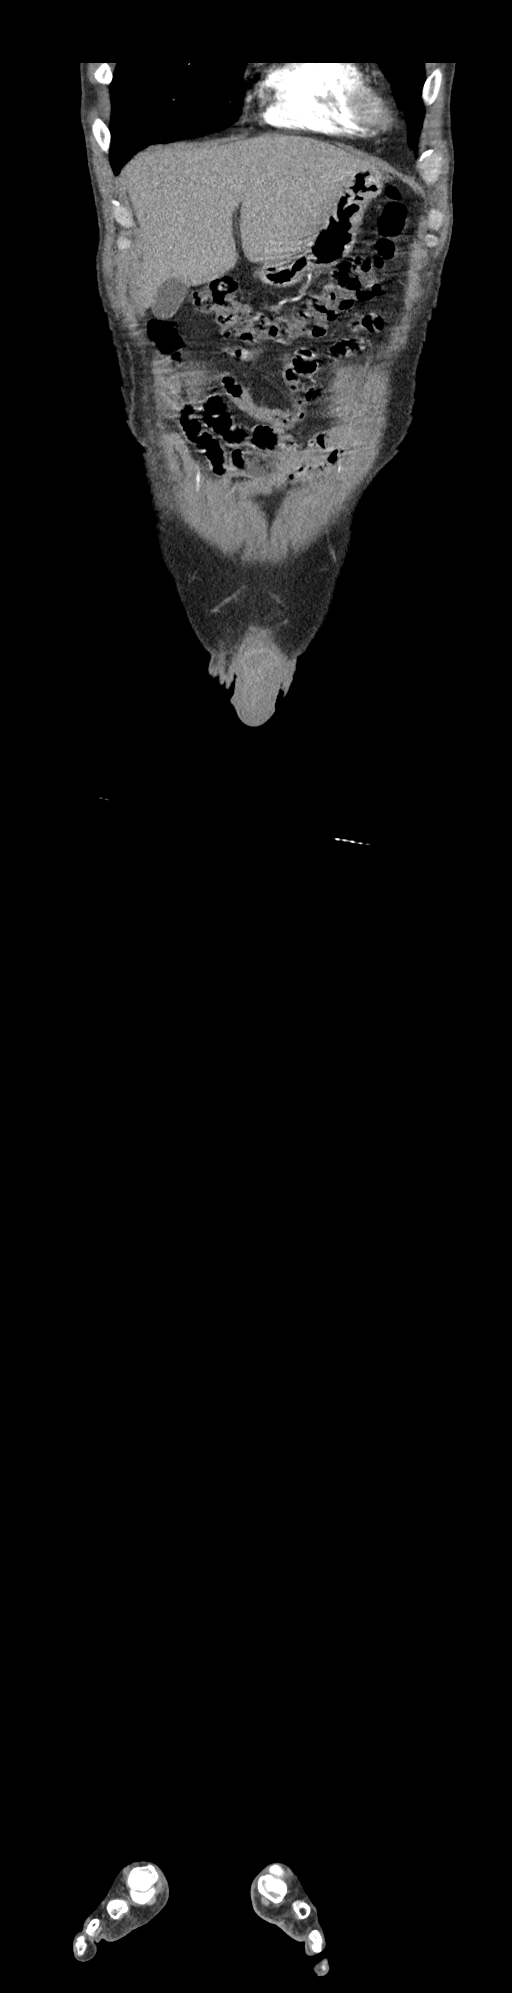
[im 49/97  soft-tissue]
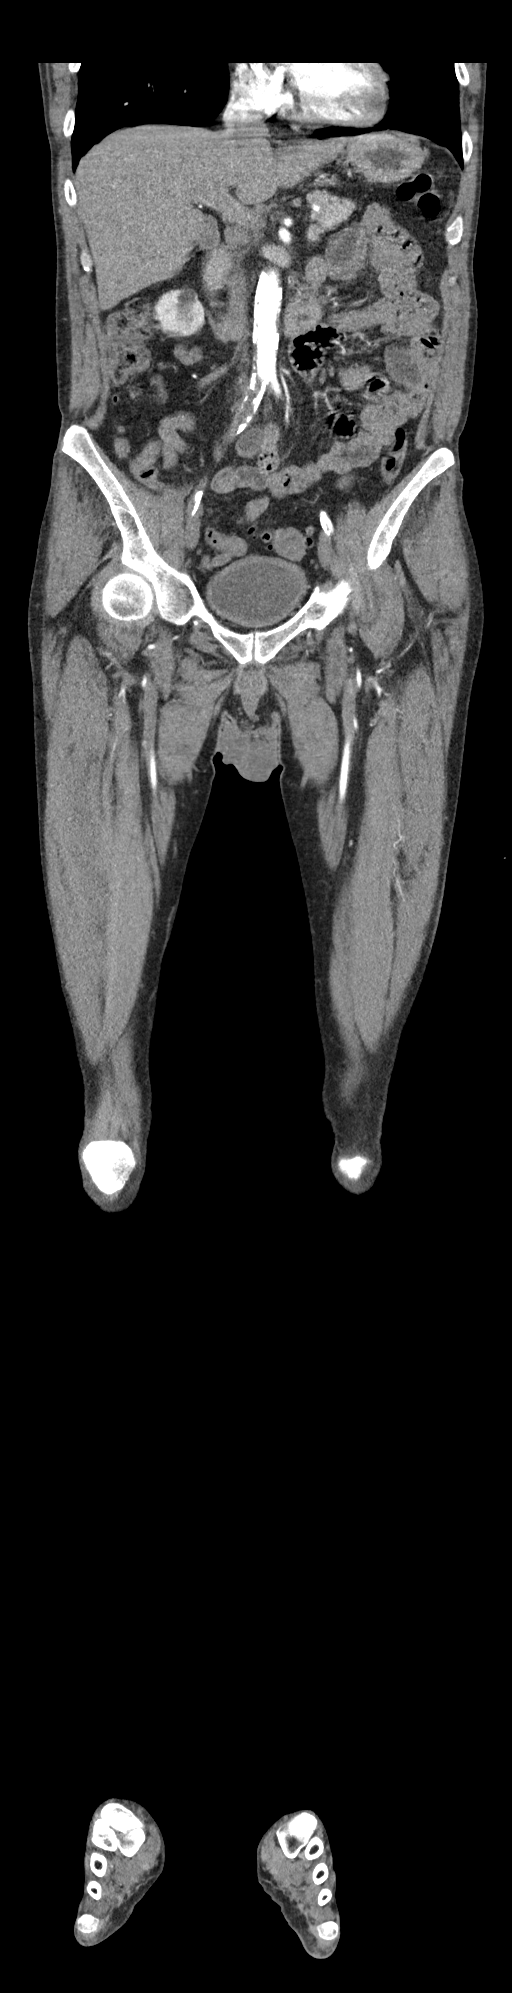
[im 73/97  soft-tissue]
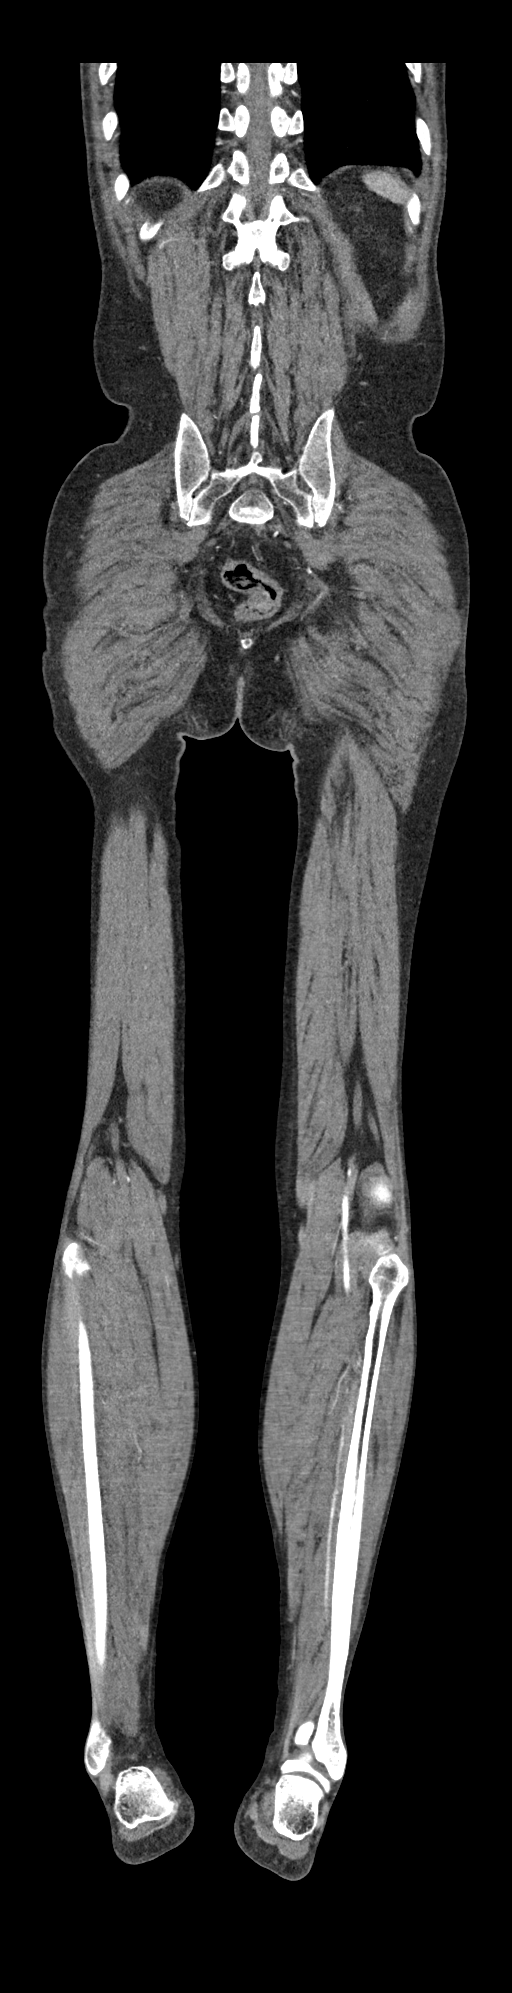

[7 of 46 positions shown; findings below may reference images not displayed]

FINDINGS: VASCULAR

Aorta: Diffuse calcified plaque involving the abdominal aorta.
Abdominal aorta is patent without significant stenosis and no
evidence for aneurysm or dissection.

Celiac: Patent without evidence of aneurysm, dissection, vasculitis
or significant stenosis.

SMA: Patent without evidence of aneurysm, dissection, vasculitis or
significant stenosis.

Renals: Both renal arteries are patent without evidence of aneurysm,
dissection, vasculitis, fibromuscular dysplasia or significant
stenosis.

IMA: Patent without evidence of aneurysm, dissection, vasculitis or
significant stenosis.

RIGHT Lower Extremity

Inflow: Right common iliac artery is occluded at the origin. Right
internal iliac artery is occluded. Right external iliac artery is
occluded. Right inferior epigastric artery is patent.

Outflow: Right common femoral artery is small but patent. Right
profunda femoral arteries are patent. Right SFA is patent. Right
popliteal artery is patent.

Runoff: Anterior tibial artery is patent. Tibioperoneal trunk is
patent. Peroneal artery is patent to the ankle. Proximal posterior
tibial artery is small and appears to be diseased but the posterior
tibial artery is patent to the ankle. Dominant runoff vessel is the
anterior tibial artery.

LEFT Lower Extremity

Inflow: Left common iliac artery is patent without significant
stenosis. Left internal iliac artery is patent. Left external iliac
artery is patent with areas of mild stenosis and areas of plaque
positive remodeling.

Outflow: Left common femoral artery is patent. Left profunda femoral
arteries are patent. Left SFA and left popliteal artery are patent.

Runoff: Proximal left anterior tibial artery is patent but the
vessel appears to occlude in the mid/distal thigh calf.
Tibioperoneal trunk is patent. Peroneal artery is patent to the
ankle. Peroneal artery reconstitutes the dorsalis pedis artery.
Posterior tibial artery is patent in the proximal calf. There may be
small segments of occlusive disease in the mid and distal posterior
tibial artery. Posterior tibial artery is patent at the ankle.

Veins: No obvious venous abnormality within the limitations of this
arterial phase study.

Review of the MIP images confirms the above findings.

NON-VASCULAR

Lower chest: Lung bases are clear.

Hepatobiliary: Normal appearance of the liver and gallbladder.

Pancreas: Unremarkable. No pancreatic ductal dilatation or
surrounding inflammatory changes.

Spleen: Normal in size without focal abnormality.

Adrenals/Urinary Tract: Adrenal glands are within normal limits.
Normal appearance of both kidneys without hydronephrosis. Evidence
for low-density right renal cyst. Mild wall thickening in the
urinary bladder.

Stomach/Bowel: Colonic diverticulosis particularly in the sigmoid
colon. Evidence for normal appendix in the right abdomen. No focal
bowel inflammation and no evidence for bowel obstruction. Normal
appearance of the stomach.

Lymphatic: No lymph node enlargement in the abdomen or pelvis.

Reproductive: Prostate is unremarkable.

Other: Negative for free fluid.  Negative for free air.

Musculoskeletal: No acute bone abnormality.
IMPRESSION: VASCULAR

1. Right inflow disease. Occlusion of the right common iliac artery
and right external iliac artery. Occlusion of the proximal right
internal iliac artery.
2. Bilateral outflow vessels are patent without significant
stenosis.
3. Mild bilateral runoff disease, left side worse than right.
4. Main visceral arteries are patent without significant stenosis.

NON-VASCULAR

1. Colonic diverticulosis.
2. No acute abnormality involving the nonvascular structures.

These results will be called to the ordering clinician or
representative by the Radiologist Assistant, and communication
documented in the PACS or [REDACTED].

## 2022-09-02 ENCOUNTER — Other Ambulatory Visit: Payer: Self-pay | Admitting: Student

## 2022-09-02 DIAGNOSIS — G629 Polyneuropathy, unspecified: Secondary | ICD-10-CM

## 2022-09-11 IMAGING — CR DG CHEST 2V
2 series · 2 of 2 positions shown · non-contrast
Comparison: None

CLINICAL DATA: Smoker, leukocytosis, hypertension

EXAM:
CHEST - 2 VIEW

[chest lat]
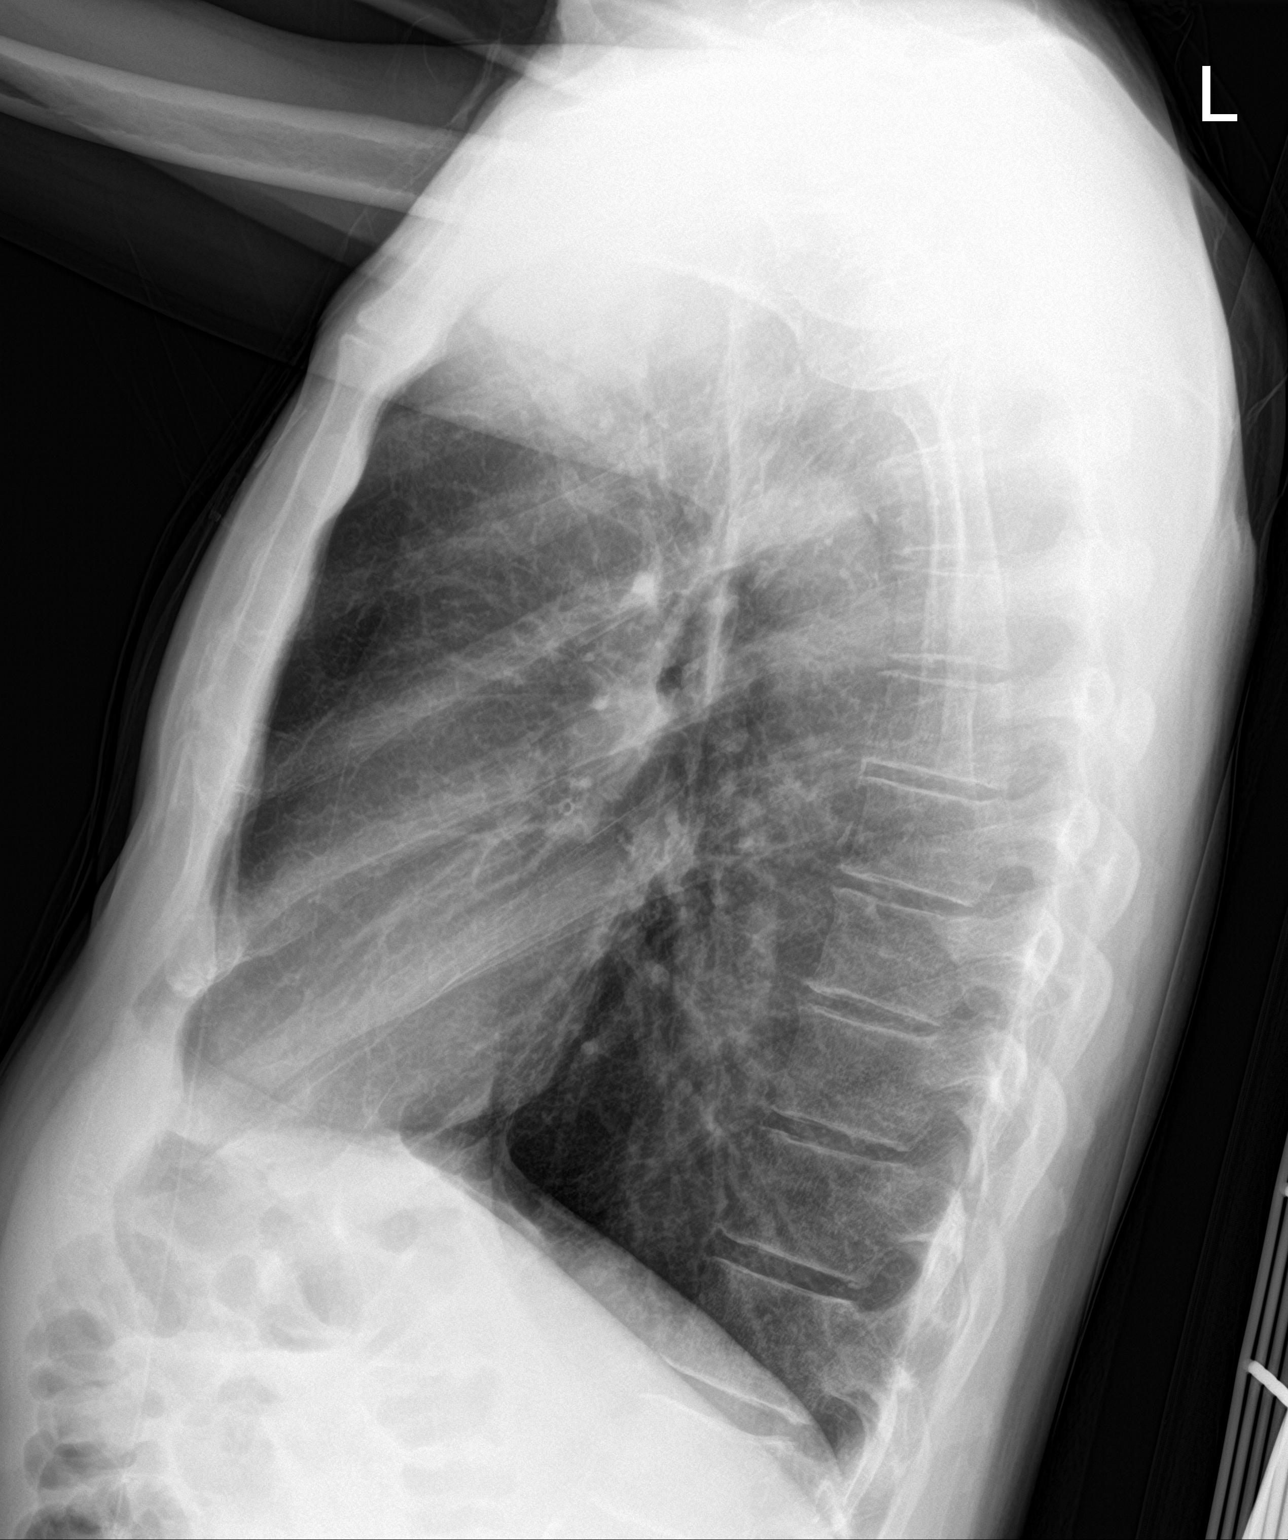

[chest ap]
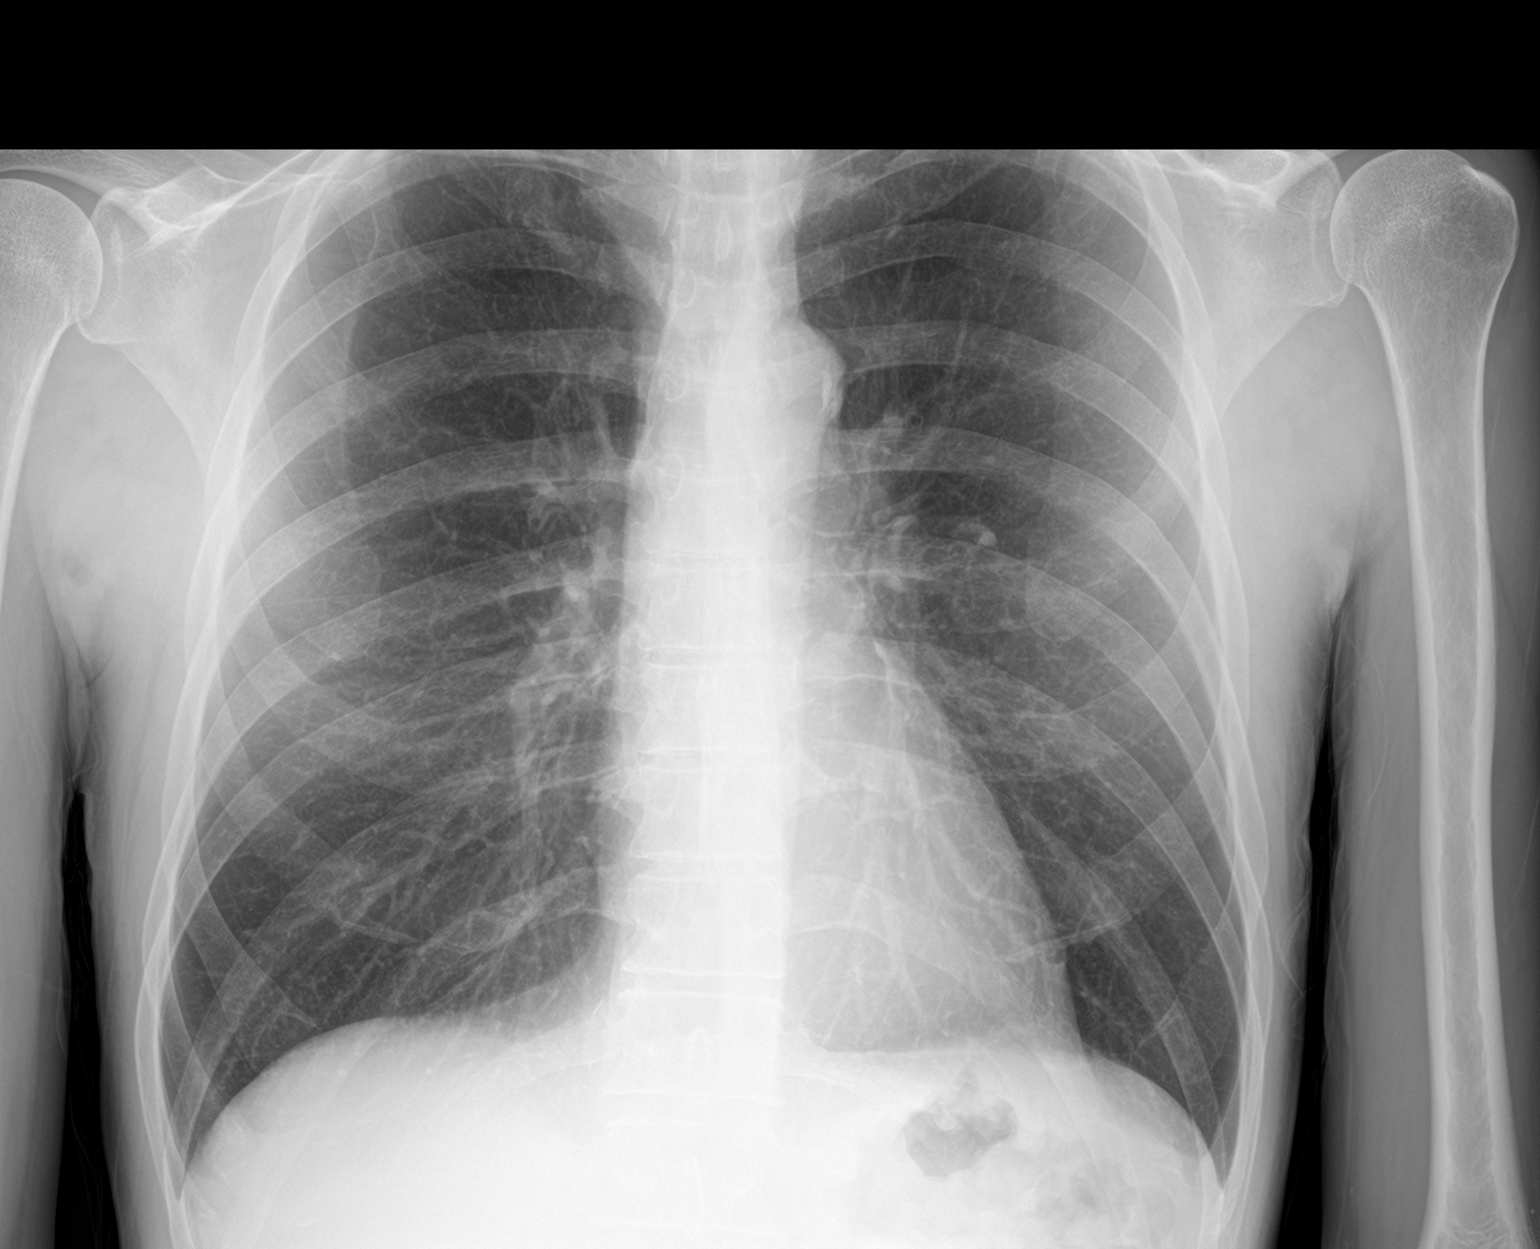

[2 of 2 positions shown; findings below may reference images not displayed]

FINDINGS: Normal heart size, mediastinal contours, and pulmonary vascularity.

Atherosclerotic calcification aorta.

Emphysematous lungs central peribronchial thickening consistent with
COPD.

No acute infiltrate, pleural effusion, or pneumothorax.

No acute osseous findings.
IMPRESSION: COPD changes without acute infiltrate.

Aortic Atherosclerosis (JBN2Y-7OK.K) and Emphysema ([id number]-VAR.F).

## 2022-09-28 IMAGING — DX DG CHEST 1V PORT
1 series · 1 of 1 positions shown · non-contrast
Comparison: February 06, 2021.

CLINICAL DATA: Chest pain after vascular surgery.

EXAM:
PORTABLE CHEST 1 VIEW

[chest ap]
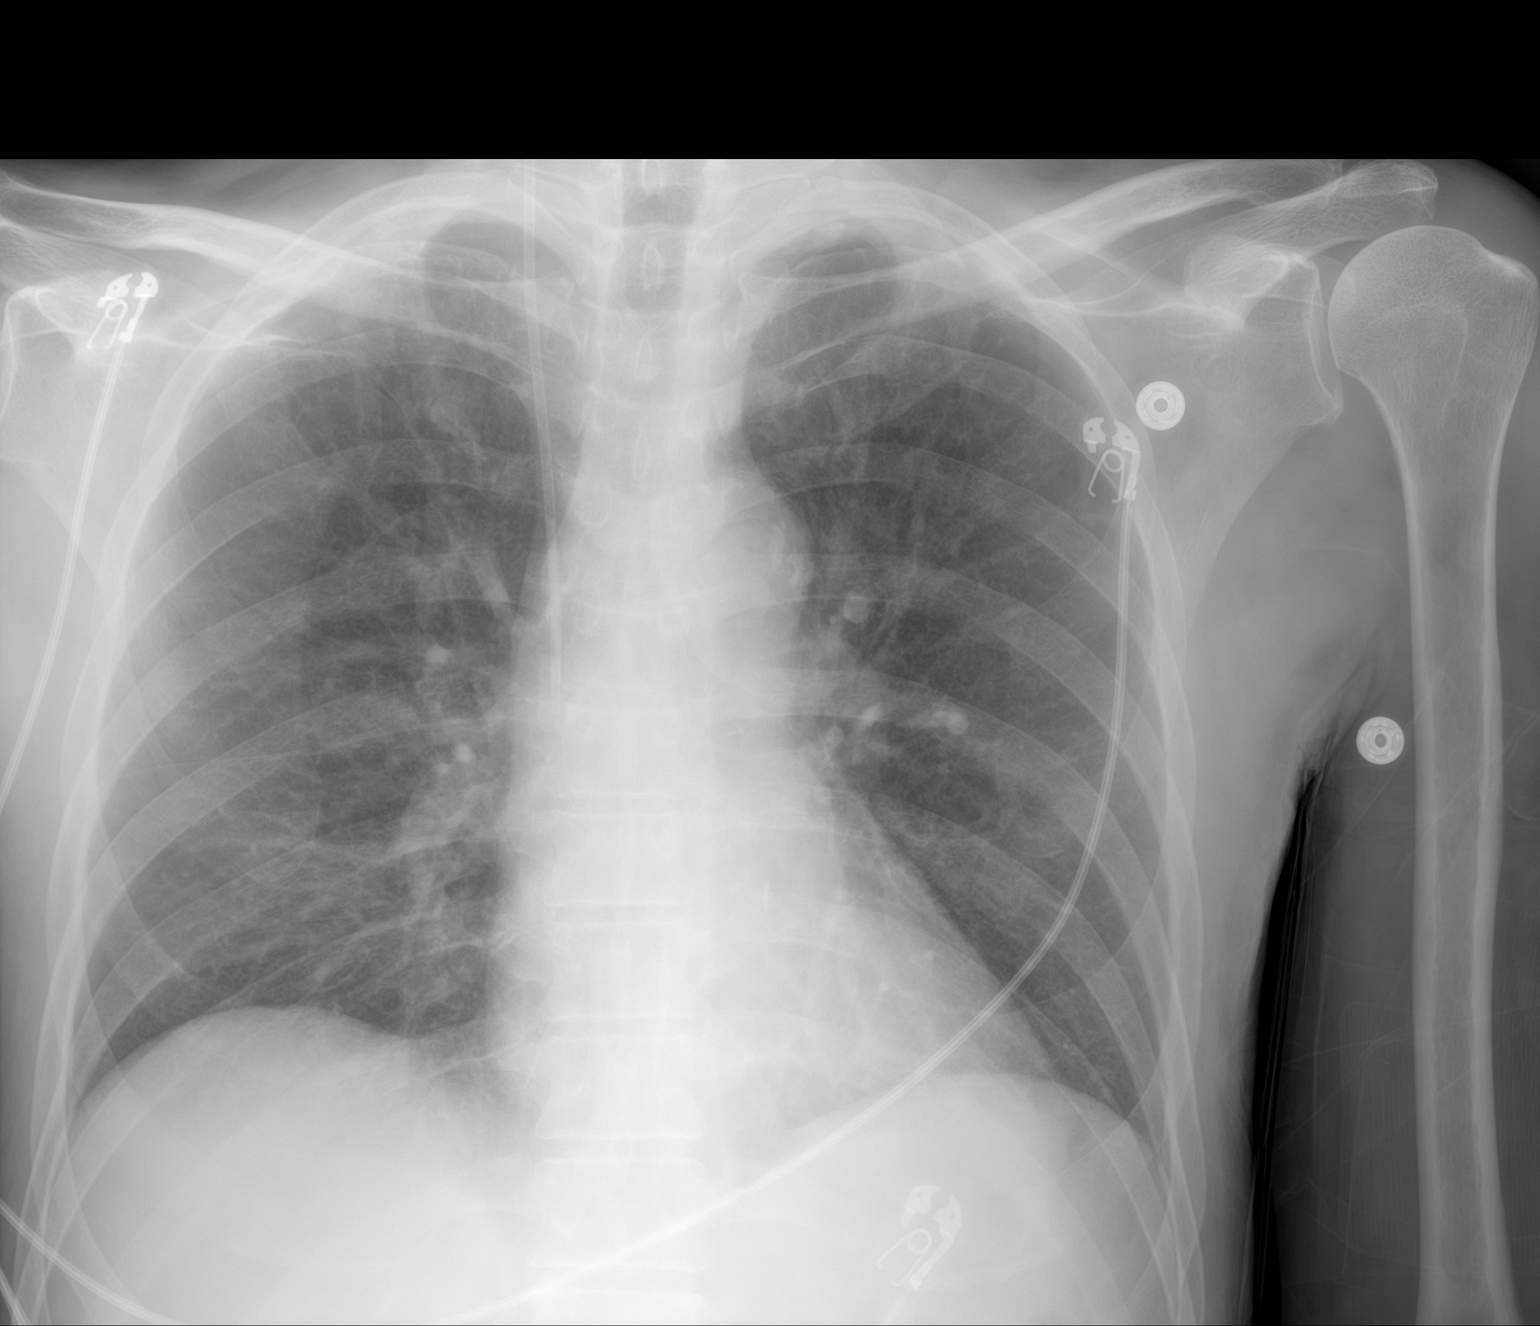

[1 of 1 positions shown; findings below may reference images not displayed]

FINDINGS: The heart size and mediastinal contours are within normal limits.
Right internal jugular catheter is unchanged in position. Both lungs
are clear. The visualized skeletal structures are unremarkable.
IMPRESSION: No active disease.

## 2022-10-01 ENCOUNTER — Other Ambulatory Visit: Payer: Self-pay

## 2022-10-01 DIAGNOSIS — G629 Polyneuropathy, unspecified: Secondary | ICD-10-CM

## 2022-10-09 ENCOUNTER — Other Ambulatory Visit: Payer: Self-pay

## 2022-10-09 DIAGNOSIS — D473 Essential (hemorrhagic) thrombocythemia: Secondary | ICD-10-CM

## 2022-10-10 NOTE — Progress Notes (Signed)
Patient Care Team: Willette Cluster, MD as PCP - General Christell Constant, MD as PCP - Cardiology (Cardiology)  DIAGNOSIS: No diagnosis found.  SUMMARY OF ONCOLOGIC HISTORY: Oncology History   No history exists.    CHIEF COMPLIANT: Thrombocythemia/Hydroxyurea    INTERVAL HISTORY: Brandon Lara is a  62 year old man with JAK2 positive myeloproliferative disorder. He presents to the clinic for a follow-up.    ALLERGIES:  is allergic to zetia [ezetimibe], crestor [rosuvastatin], lipitor [atorvastatin], and penicillins.  MEDICATIONS:  Current Outpatient Medications  Medication Sig Dispense Refill   aspirin EC 81 MG tablet Take 81 mg by mouth daily. Swallow whole.     b complex vitamins capsule Take 1 capsule by mouth daily.     Evolocumab (REPATHA SURECLICK) 140 MG/ML SOAJ Inject 140 mg into the skin every 14 (fourteen) days. 2 mL 11   gabapentin (NEURONTIN) 300 MG capsule TAKE 2 CAPSULES BY MOUTH THREE TIMES DAILY 180 capsule 0   hydrochlorothiazide (MICROZIDE) 12.5 MG capsule Take 1 capsule (12.5 mg total) by mouth daily. 30 capsule 5   hydroxyurea (HYDREA) 500 MG capsule Take 1 capsule (500 mg total) by mouth daily. May take with food to minimize GI side effects. 60 capsule 6   Multiple Vitamin (MULTIVITAMIN WITH MINERALS) TABS tablet Take 1 tablet by mouth daily.     polyethylene glycol (MIRALAX) 17 g packet Take 17 g by mouth daily. 30 each 2   No current facility-administered medications for this visit.    PHYSICAL EXAMINATION: ECOG PERFORMANCE STATUS: {CHL ONC ECOG PS:[id number]}  There were no vitals filed for this visit. There were no vitals filed for this visit.  BREAST:*** No palpable masses or nodules in either right or left breasts. No palpable axillary supraclavicular or infraclavicular adenopathy no breast tenderness or nipple discharge. (exam performed in the presence of a chaperone)  LABORATORY DATA:  I have reviewed the data as listed    Latest  Ref Rng & Units 07/20/2022    1:00 PM 06/22/2022    8:53 AM 01/13/2022    9:35 AM  CMP  Glucose 70 - 99 mg/dL 83     BUN 6 - 20 mg/dL 13     Creatinine 4.09 - 1.24 mg/dL 8.11     Sodium 914 - 782 mmol/L 139     Potassium 3.5 - 5.1 mmol/L 3.6     Chloride 98 - 111 mmol/L 101     CO2 22 - 32 mmol/L 31     Calcium 8.9 - 10.3 mg/dL 8.9     Total Protein 6.5 - 8.1 g/dL 7.1  6.5    Total Bilirubin 0.3 - 1.2 mg/dL 0.4  0.5    Alkaline Phos 38 - 126 U/L 97  115    AST 15 - 41 U/L 16  20    ALT 0 - 44 U/L 17  22  22      Lab Results  Component Value Date   WBC 17.3 (H) 07/20/2022   HGB 13.6 07/20/2022   HCT 39.9 07/20/2022   MCV 105.0 (H) 07/20/2022   PLT 683 (H) 07/20/2022   NEUTROABS 11.2 (H) 07/20/2022    ASSESSMENT & PLAN:  No problem-specific Assessment & Plan notes found for this encounter.    No orders of the defined types were placed in this encounter.  The patient has a good understanding of the overall plan. he agrees with it. he will call with any problems that may develop before the next visit  here. Total time spent: 30 mins including face to face time and time spent for planning, charting and co-ordination of care   Sherlyn Lick, CMA 10/10/22    I Janan Ridge am acting as a Neurosurgeon for [employer]  ***

## 2022-10-12 ENCOUNTER — Other Ambulatory Visit: Payer: Self-pay

## 2022-10-12 ENCOUNTER — Inpatient Hospital Stay: Payer: BC Managed Care – PPO | Admitting: Hematology and Oncology

## 2022-10-12 ENCOUNTER — Inpatient Hospital Stay: Payer: BC Managed Care – PPO | Attending: Oncology

## 2022-10-12 VITALS — BP 116/75 | HR 88 | Temp 97.7°F | Resp 18 | Ht 66.0 in | Wt 161.6 lb

## 2022-10-12 DIAGNOSIS — D473 Essential (hemorrhagic) thrombocythemia: Secondary | ICD-10-CM

## 2022-10-12 DIAGNOSIS — D471 Chronic myeloproliferative disease: Secondary | ICD-10-CM | POA: Insufficient documentation

## 2022-10-12 DIAGNOSIS — D72829 Elevated white blood cell count, unspecified: Secondary | ICD-10-CM

## 2022-10-12 DIAGNOSIS — D75839 Thrombocytosis, unspecified: Secondary | ICD-10-CM | POA: Diagnosis present

## 2022-10-12 DIAGNOSIS — Z880 Allergy status to penicillin: Secondary | ICD-10-CM | POA: Insufficient documentation

## 2022-10-12 DIAGNOSIS — Z79899 Other long term (current) drug therapy: Secondary | ICD-10-CM | POA: Diagnosis not present

## 2022-10-12 LAB — CMP (CANCER CENTER ONLY)
ALT: 17 U/L (ref 0–44)
AST: 17 U/L (ref 15–41)
Albumin: 4.3 g/dL (ref 3.5–5.0)
Alkaline Phosphatase: 98 U/L (ref 38–126)
Anion gap: 7 (ref 5–15)
BUN: 15 mg/dL (ref 8–23)
CO2: 31 mmol/L (ref 22–32)
Calcium: 9 mg/dL (ref 8.9–10.3)
Chloride: 101 mmol/L (ref 98–111)
Creatinine: 0.92 mg/dL (ref 0.61–1.24)
GFR, Estimated: >60 mL/min (ref >60)
Glucose, Bld: 88 mg/dL (ref 70–99)
Potassium: 3.6 mmol/L (ref 3.5–5.1)
Sodium: 139 mmol/L (ref 135–145)
Total Bilirubin: 0.3 mg/dL (ref 0.3–1.2)
Total Protein: 7.1 g/dL (ref 6.5–8.1)

## 2022-10-12 LAB — CBC WITH DIFFERENTIAL (CANCER CENTER ONLY)
Abs Immature Granulocytes: 0.3 10*3/uL — ABNORMAL HIGH (ref 0.00–0.07)
Basophils Absolute: 0.2 10*3/uL — ABNORMAL HIGH (ref 0.0–0.1)
Basophils Relative: 1 %
Eosinophils Absolute: 0.3 10*3/uL (ref 0.0–0.5)
Eosinophils Relative: 2 %
HCT: 41.5 % (ref 39.0–52.0)
Hemoglobin: 14.1 g/dL (ref 13.0–17.0)
Immature Granulocytes: 2 %
Lymphocytes Relative: 22 %
Lymphs Abs: 3.2 10*3/uL (ref 0.7–4.0)
MCH: 36 pg — ABNORMAL HIGH (ref 26.0–34.0)
MCHC: 34 g/dL (ref 30.0–36.0)
MCV: 105.9 fL — ABNORMAL HIGH (ref 80.0–100.0)
Monocytes Absolute: 1.4 10*3/uL — ABNORMAL HIGH (ref 0.1–1.0)
Monocytes Relative: 10 %
Neutro Abs: 9.2 10*3/uL — ABNORMAL HIGH (ref 1.7–7.7)
Neutrophils Relative %: 63 %
Platelet Count: 796 10*3/uL — ABNORMAL HIGH (ref 150–400)
RBC: 3.92 MIL/uL — ABNORMAL LOW (ref 4.22–5.81)
RDW: 13.6 % (ref 11.5–15.5)
WBC Count: 14.6 10*3/uL — ABNORMAL HIGH (ref 4.0–10.5)
nRBC: 0 % (ref 0.0–0.2)

## 2022-10-12 MED ORDER — HYDROXYUREA 500 MG PO CAPS: 1000.0000 mg | ORAL_CAPSULE | Freq: Every day | ORAL | 3 refills | Status: DC

## 2022-10-12 NOTE — Assessment & Plan Note (Signed)
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  He was found to have JAK2 positive disease.  Current treatment: Hydroxyurea 500 mg daily (on Tuesdays and Thursdays he takes 1000 mg) Hydroxyurea toxicities: Denies any side effects to Hydrea   Lab review: 07/20/2022: WBC 17.3, hemoglobin 13.6, platelets 683, ANC 11.2, AMC 1.8 I discussed with the patient the labs represent stable findings and therefore we decided to keep the dosage the same.   Return to clinic in 3 months with labs and follow-up after that we could see the patient every 4 months

## 2022-11-02 NOTE — Progress Notes (Signed)
Patient Care Team: Willette Cluster, MD as PCP - General Christell Constant, MD as PCP - Cardiology (Cardiology)  DIAGNOSIS:  Encounter Diagnosis  Name Primary?   Essential thrombocytosis (HCC) Yes      CHIEF COMPLIANT: Thrombocythemia/Hydroxyurea   INTERVAL HISTORY: Brandon Lara is a  62 year old man with JAK2 positive myeloproliferative disorder. He presents to the clinic for a follow-up. He reports that he has been doing good. He denies any diarrhea or nausea.   ALLERGIES:  is allergic to zetia [ezetimibe], crestor [rosuvastatin], lipitor [atorvastatin], and penicillins.  MEDICATIONS:  Current Outpatient Medications  Medication Sig Dispense Refill   aspirin EC 81 MG tablet Take 81 mg by mouth daily. Swallow whole.     b complex vitamins capsule Take 1 capsule by mouth daily.     Evolocumab (REPATHA SURECLICK) 140 MG/ML SOAJ Inject 140 mg into the skin every 14 (fourteen) days. 2 mL 11   gabapentin (NEURONTIN) 300 MG capsule TAKE 2 CAPSULES BY MOUTH THREE TIMES DAILY 180 capsule 0   hydroxyurea (HYDREA) 500 MG capsule Take 2 capsules (1,000 mg total) by mouth daily. 180 capsule 3   Multiple Vitamin (MULTIVITAMIN WITH MINERALS) TABS tablet Take 1 tablet by mouth daily.     polyethylene glycol (MIRALAX) 17 g packet Take 17 g by mouth daily. 30 each 2   hydrochlorothiazide (MICROZIDE) 12.5 MG capsule Take 1 capsule (12.5 mg total) by mouth daily. 30 capsule 5   No current facility-administered medications for this visit.    PHYSICAL EXAMINATION: ECOG PERFORMANCE STATUS: 1 - Symptomatic but completely ambulatory  Vitals:   11/12/22 1350  BP: 118/74  Pulse: 80  Resp: 18  Temp: 97.9 F (36.6 C)  SpO2: 100%   Filed Weights   11/12/22 1350  Weight: 161 lb 12.8 oz (73.4 kg)      LABORATORY DATA:  I have reviewed the data as listed    Latest Ref Rng & Units 10/12/2022    1:29 PM 07/20/2022    1:00 PM 06/22/2022    8:53 AM  CMP  Glucose 70 - 99 mg/dL 88  83     BUN 8 - 23 mg/dL 15  13    Creatinine 1.61 - 1.24 mg/dL 0.96  0.45    Sodium 409 - 145 mmol/L 139  139    Potassium 3.5 - 5.1 mmol/L 3.6  3.6    Chloride 98 - 111 mmol/L 101  101    CO2 22 - 32 mmol/L 31  31    Calcium 8.9 - 10.3 mg/dL 9.0  8.9    Total Protein 6.5 - 8.1 g/dL 7.1  7.1  6.5   Total Bilirubin 0.3 - 1.2 mg/dL 0.3  0.4  0.5   Alkaline Phos 38 - 126 U/L 98  97  115   AST 15 - 41 U/L 17  16  20    ALT 0 - 44 U/L 17  17  22      Lab Results  Component Value Date   WBC 14.7 (H) 11/12/2022   HGB 13.8 11/12/2022   HCT 41.0 11/12/2022   MCV 107.3 (H) 11/12/2022   PLT 697 (H) 11/12/2022   NEUTROABS 9.3 (H) 11/12/2022    ASSESSMENT & PLAN:  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  He was found to have JAK2 positive disease.  Current treatment: Hydroxyurea 500 mg daily (on Tuesdays and Thursdays he takes 1000 mg) Hydroxyurea toxicities: Denies any side effects to Hydrea  Lab review: 07/20/2022: WBC 17.3, hemoglobin 13.6, platelets 683, ANC 11.2, AMC 1.8 10/12/2022: WBC 14.6, hemoglobin 14.1, platelets 796, ANC 9.2, AMC 1.4 11/12/2022: WBC 14.1, hemoglobin 13.8, platelets 697 (currently on 1000 mg hydroxyurea daily)   Return to clinic in 3 months with labs and follow-up.  Orders Placed This Encounter  Procedures   CBC with Differential (Cancer Center Only)    Standing Status:   Future    Standing Expiration Date:   11/12/2023   The patient has a good understanding of the overall plan. he agrees with it. he will call with any problems that may develop before the next visit here. Total time spent: 30 mins including face to face time and time spent for planning, charting and co-ordination of care   Tamsen Meek, MD 11/12/22    I Janan Ridge am acting as a Neurosurgeon for [employer]  I have reviewed the above documentation for accuracy and completeness, and I agree with the above.

## 2022-11-05 ENCOUNTER — Other Ambulatory Visit: Payer: Self-pay

## 2022-11-05 DIAGNOSIS — G629 Polyneuropathy, unspecified: Secondary | ICD-10-CM

## 2022-11-12 ENCOUNTER — Inpatient Hospital Stay: Payer: BC Managed Care – PPO | Attending: Oncology

## 2022-11-12 ENCOUNTER — Inpatient Hospital Stay (HOSPITAL_BASED_OUTPATIENT_CLINIC_OR_DEPARTMENT_OTHER): Payer: BC Managed Care – PPO | Admitting: Hematology and Oncology

## 2022-11-12 ENCOUNTER — Other Ambulatory Visit: Payer: Self-pay

## 2022-11-12 VITALS — BP 118/74 | HR 80 | Temp 97.9°F | Resp 18 | Ht 66.0 in | Wt 161.8 lb

## 2022-11-12 DIAGNOSIS — D471 Chronic myeloproliferative disease: Secondary | ICD-10-CM | POA: Insufficient documentation

## 2022-11-12 DIAGNOSIS — Z79899 Other long term (current) drug therapy: Secondary | ICD-10-CM | POA: Diagnosis not present

## 2022-11-12 DIAGNOSIS — D473 Essential (hemorrhagic) thrombocythemia: Secondary | ICD-10-CM | POA: Diagnosis present

## 2022-11-12 DIAGNOSIS — Z880 Allergy status to penicillin: Secondary | ICD-10-CM | POA: Diagnosis not present

## 2022-11-12 LAB — CBC WITH DIFFERENTIAL (CANCER CENTER ONLY)
Abs Immature Granulocytes: 0.35 10*3/uL — ABNORMAL HIGH (ref 0.00–0.07)
Basophils Absolute: 0.2 10*3/uL — ABNORMAL HIGH (ref 0.0–0.1)
Basophils Relative: 1 %
Eosinophils Absolute: 0.3 10*3/uL (ref 0.0–0.5)
Eosinophils Relative: 2 %
HCT: 41 % (ref 39.0–52.0)
Hemoglobin: 13.8 g/dL (ref 13.0–17.0)
Immature Granulocytes: 2 %
Lymphocytes Relative: 22 %
Lymphs Abs: 3.2 10*3/uL (ref 0.7–4.0)
MCH: 36.1 pg — ABNORMAL HIGH (ref 26.0–34.0)
MCHC: 33.7 g/dL (ref 30.0–36.0)
MCV: 107.3 fL — ABNORMAL HIGH (ref 80.0–100.0)
Monocytes Absolute: 1.3 10*3/uL — ABNORMAL HIGH (ref 0.1–1.0)
Monocytes Relative: 9 %
Neutro Abs: 9.3 10*3/uL — ABNORMAL HIGH (ref 1.7–7.7)
Neutrophils Relative %: 64 %
Platelet Count: 697 10*3/uL — ABNORMAL HIGH (ref 150–400)
RBC: 3.82 MIL/uL — ABNORMAL LOW (ref 4.22–5.81)
RDW: 14.3 % (ref 11.5–15.5)
WBC Count: 14.7 10*3/uL — ABNORMAL HIGH (ref 4.0–10.5)
nRBC: 0 % (ref 0.0–0.2)

## 2022-11-12 NOTE — Assessment & Plan Note (Signed)
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  He was found to have JAK2 positive disease.  Current treatment: Hydroxyurea 500 mg daily (on Tuesdays and Thursdays he takes 1000 mg) Hydroxyurea toxicities: Denies any side effects to Hydrea   Lab review: 07/20/2022: WBC 17.3, hemoglobin 13.6, platelets 683, ANC 11.2, AMC 1.8 10/12/2022: WBC 14.6, hemoglobin 14.1, platelets 796, ANC 9.2, AMC 1.4 11/12/2022:   Patient did not take Hydrea for 4 days.  It is unclear if the elevation of the platelet count is related to that.  Therefore we will recheck labs in 1 month and follow-up.  If he still has very high platelet counts and we will increase the Hydrea dosage accordingly.

## 2022-12-01 ENCOUNTER — Other Ambulatory Visit: Payer: Self-pay

## 2022-12-01 DIAGNOSIS — G629 Polyneuropathy, unspecified: Secondary | ICD-10-CM

## 2022-12-07 NOTE — Progress Notes (Unsigned)
  Cardiology Office Note:  .   Date:  12/08/2022  ID:  Brandon Lara, DOB [birth year], MRN [id number] PCP: Kathleen Lime, MD  [facility] Providers Cardiologist:  Christell Constant, MD    Patient Profile: .      PMH PAD  Aortic atherosclerosis S/p aortobifemoral bypass graft  Followed by Dr. 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   Brandon Lara is a very pleasant 62 y.o. male here alone today for follow-up. Reports he has difficulty getting comfortable enough to fall asleep. Has chronic right leg pain since bypass graft and has restless leg syndrome if he lies down for too long without falling asleep. Also has neuropathy, gabapentin no longer helps.  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 No regular exercise. He denies chest pain, shortness of breath, lower extremity edema, fatigue, presyncope, syncope, orthopnea, and PND.  Notices occasional palpitations which are not bothersome to him.  No problems with medications.  ROS: + leg discomfort       Studies Reviewed: .         Risk Assessment/Calculations:             Physical Exam:   VS:  BP 128/76   Pulse 76   Ht 5\' 6"  (1.676 m)   Wt 160 lb 12.8 oz (72.9 kg)   SpO2 98%   BMI 25.95 kg/m    Wt Readings from Last 3 Encounters:  12/08/22 160 lb 12.8 oz (72.9 kg)  11/12/22 161 lb 12.8 oz (73.4 kg)  10/12/22 161 lb 9.6 oz (73.3 kg)    GEN: Well nourished, well developed in no acute distress NECK: No JVD; No carotid bruits CARDIAC: RRR, no murmurs, rubs, gallops RESPIRATORY:  Clear to auscultation without rales, wheezing or rhonchi  ABDOMEN: Soft, non-tender, non-distended EXTREMITIES:  No edema; No deformity     ASSESSMENT AND PLAN: .     Palpitations/PVCs: Occasional palpitations that have not been burdensome. PVC noted on EKG today.  Encouraged him to continue to monitor and notify us if these become more frequent.  Dyslipidemia: LDL 31 (down from 149) on 09/15/79, normal apo B. Had significant myalgia on statins. Continue Repatha.  Encouraged heart healthy, mostly plant-based diet and 150 minutes of moderate intensity exercise each week. Encouraged him to try walking in the evenings to improve sleep.   PAD: S/p aorto-bi-femoral bypass 02/06/2021. He is having leg discomfort that impairs sleep at times. No pain with walking. Encouraged 150 minutes moderate intensity exercise each week. Management per [facility].  Continue good cholesterol control and aspirin.  Hypertension: BP is well controlled. Stable renal function on lab work 10/12/22.         Dispo: 1 year with Dr. Izora Ribas or APP  Signed, Eligha Bridegroom, NP-C

## 2022-12-08 ENCOUNTER — Ambulatory Visit: Payer: BC Managed Care – PPO | Attending: Nurse Practitioner | Admitting: Nurse Practitioner

## 2022-12-08 ENCOUNTER — Encounter: Payer: Self-pay | Admitting: Nurse Practitioner

## 2022-12-08 VITALS — BP 128/76 | HR 76 | Ht 66.0 in | Wt 160.8 lb

## 2022-12-08 DIAGNOSIS — I700 Atherosclerosis of aorta: Secondary | ICD-10-CM | POA: Diagnosis not present

## 2022-12-08 DIAGNOSIS — I10 Essential (primary) hypertension: Secondary | ICD-10-CM | POA: Diagnosis not present

## 2022-12-08 NOTE — Patient Instructions (Addendum)
Medication Instructions:  Your physician recommends that you continue on your current medications as directed. Please refer to the Current Medication list given to you today. *If you need a refill on your cardiac medications before your next appointment, please call your pharmacy*   Lab Work: None ordered   Testing/Procedures: Your physician has requested that you have a carotid duplex. This test is an ultrasound of the carotid arteries in your neck. It looks at blood flow through these arteries that supply the brain with blood. Allow one hour for this exam. There are no restrictions or special instructions. TEST WILL BE PERFORMED AT [address] PLEASE SCHEDULE AFTER 05/15/2023   Follow-Up: At [facility], you and your health needs are our priority.  As part of our continuing mission to provide you with exceptional heart care, we have created designated Provider Care Teams.  These Care Teams include your primary Cardiologist (physician) and Advanced Practice Providers (APPs -  Physician Assistants and Nurse Practitioners) who all work together to provide you with the care you need, when you need it.  We recommend signing up for the patient portal called "MyChart".  Sign up information is provided on this After Visit Summary.  MyChart is used to connect with patients for Virtual Visits (Telemedicine).  Patients are able to view lab/test results, encounter notes, upcoming appointments, etc.  Non-urgent messages can be sent to your provider as well.   To learn more about what you can do with MyChart, go to [email or web address].    Your next appointment:   12 month(s)  Provider:   Christell Constant, MD     Other Instructions

## 2023-01-02 ENCOUNTER — Other Ambulatory Visit: Payer: Self-pay | Admitting: Internal Medicine

## 2023-01-02 DIAGNOSIS — I10 Essential (primary) hypertension: Secondary | ICD-10-CM

## 2023-02-03 ENCOUNTER — Other Ambulatory Visit: Payer: Self-pay | Admitting: Student

## 2023-02-03 DIAGNOSIS — I10 Essential (primary) hypertension: Secondary | ICD-10-CM

## 2023-02-03 NOTE — Telephone Encounter (Signed)
LOV 08/10/22. Pt called to schedule his 6 month f/u appt; he's agreeable. Call transferred to the front office. Appt schedule w/Dr Carlynn Purl on 03/29/23.

## 2023-02-12 ENCOUNTER — Inpatient Hospital Stay: Payer: BC Managed Care – PPO

## 2023-02-12 ENCOUNTER — Inpatient Hospital Stay: Payer: BC Managed Care – PPO | Attending: Oncology | Admitting: Hematology and Oncology

## 2023-02-12 VITALS — BP 108/70 | HR 96 | Temp 97.7°F | Resp 18 | Ht 66.0 in | Wt 160.3 lb

## 2023-02-12 DIAGNOSIS — D471 Chronic myeloproliferative disease: Secondary | ICD-10-CM | POA: Diagnosis present

## 2023-02-12 DIAGNOSIS — G2581 Restless legs syndrome: Secondary | ICD-10-CM | POA: Diagnosis not present

## 2023-02-12 DIAGNOSIS — G479 Sleep disorder, unspecified: Secondary | ICD-10-CM | POA: Insufficient documentation

## 2023-02-12 DIAGNOSIS — Z880 Allergy status to penicillin: Secondary | ICD-10-CM | POA: Insufficient documentation

## 2023-02-12 DIAGNOSIS — Z79899 Other long term (current) drug therapy: Secondary | ICD-10-CM | POA: Insufficient documentation

## 2023-02-12 DIAGNOSIS — D473 Essential (hemorrhagic) thrombocythemia: Secondary | ICD-10-CM

## 2023-02-12 DIAGNOSIS — G629 Polyneuropathy, unspecified: Secondary | ICD-10-CM | POA: Diagnosis not present

## 2023-02-12 LAB — CBC WITH DIFFERENTIAL (CANCER CENTER ONLY)
Abs Immature Granulocytes: 0.27 10*3/uL — ABNORMAL HIGH (ref 0.00–0.07)
Basophils Absolute: 0.2 10*3/uL — ABNORMAL HIGH (ref 0.0–0.1)
Basophils Relative: 1 %
Eosinophils Absolute: 0.2 10*3/uL (ref 0.0–0.5)
Eosinophils Relative: 1 %
HCT: 40.7 % (ref 39.0–52.0)
Hemoglobin: 14.5 g/dL (ref 13.0–17.0)
Immature Granulocytes: 2 %
Lymphocytes Relative: 19 %
Lymphs Abs: 2.8 10*3/uL (ref 0.7–4.0)
MCH: 39.5 pg — ABNORMAL HIGH (ref 26.0–34.0)
MCHC: 35.6 g/dL (ref 30.0–36.0)
MCV: 110.9 fL — ABNORMAL HIGH (ref 80.0–100.0)
Monocytes Absolute: 1.3 10*3/uL — ABNORMAL HIGH (ref 0.1–1.0)
Monocytes Relative: 8 %
Neutro Abs: 10.4 10*3/uL — ABNORMAL HIGH (ref 1.7–7.7)
Neutrophils Relative %: 69 %
Platelet Count: 685 10*3/uL — ABNORMAL HIGH (ref 150–400)
RBC: 3.67 MIL/uL — ABNORMAL LOW (ref 4.22–5.81)
RDW: 13.9 % (ref 11.5–15.5)
WBC Count: 15.2 10*3/uL — ABNORMAL HIGH (ref 4.0–10.5)
nRBC: 0 % (ref 0.0–0.2)

## 2023-02-12 MED ORDER — GABAPENTIN 300 MG PO CAPS: 600.0000 mg | ORAL_CAPSULE | Freq: Three times a day (TID) | ORAL | 3 refills | Status: DC

## 2023-02-12 NOTE — Progress Notes (Signed)
Patient Care Team: Kathleen Lime, MD as PCP - General Christell Constant, MD as PCP - Cardiology (Cardiology)  DIAGNOSIS:  Encounter Diagnoses  Name Primary?   Essential thrombocytosis (HCC) Yes   Peripheral polyneuropathy      CHIEF COMPLIANT: Thrombocythemia/Hydroxyurea     INTERVAL HISTORY: Brandon Lara is a 62 year old man with JAK2 positive myeloproliferative disorder. He presents to the clinic for a follow-up.  Patient reports he can not sleep at night. He also is having dry flaky skin. He is having restless leg syndrome. Complains of not having a appetite anymore. Denies any diarrhea or nausea.  ALLERGIES:  is allergic to zetia [ezetimibe], crestor [rosuvastatin], lipitor [atorvastatin], and penicillins.  MEDICATIONS:  Current Outpatient Medications  Medication Sig Dispense Refill   aspirin EC 81 MG tablet Take 81 mg by mouth daily. Swallow whole.     b complex vitamins capsule Take 1 capsule by mouth daily.     Evolocumab (REPATHA SURECLICK) 140 MG/ML SOAJ Inject 140 mg into the skin every 14 (fourteen) days. 2 mL 11   gabapentin (NEURONTIN) 300 MG capsule Take 2 capsules (600 mg total) by mouth 3 (three) times daily. 180 capsule 3   hydrochlorothiazide (MICROZIDE) 12.5 MG capsule Take 1 capsule by mouth once daily 30 capsule 0   hydroxyurea (HYDREA) 500 MG capsule Take 2 capsules (1,000 mg total) by mouth daily. 180 capsule 3   Multiple Vitamin (MULTIVITAMIN WITH MINERALS) TABS tablet Take 1 tablet by mouth daily.     polyethylene glycol (MIRALAX) 17 g packet Take 17 g by mouth daily. 30 each 2   No current facility-administered medications for this visit.    PHYSICAL EXAMINATION: ECOG PERFORMANCE STATUS: 1 - Symptomatic but completely ambulatory  Vitals:   02/12/23 1041  BP: 108/70  Pulse: 96  Resp: 18  Temp: 97.7 F (36.5 C)  SpO2: 100%   Filed Weights   02/12/23 1041  Weight: 160 lb 4.8 oz (72.7 kg)      LABORATORY DATA:  I have  reviewed the data as listed    Latest Ref Rng & Units 10/12/2022    1:29 PM 07/20/2022    1:00 PM 06/22/2022    8:53 AM  CMP  Glucose 70 - 99 mg/dL 88  83    BUN 8 - 23 mg/dL 15  13    Creatinine 1.61 - 1.24 mg/dL 0.96  0.45    Sodium 409 - 145 mmol/L 139  139    Potassium 3.5 - 5.1 mmol/L 3.6  3.6    Chloride 98 - 111 mmol/L 101  101    CO2 22 - 32 mmol/L 31  31    Calcium 8.9 - 10.3 mg/dL 9.0  8.9    Total Protein 6.5 - 8.1 g/dL 7.1  7.1  6.5   Total Bilirubin 0.3 - 1.2 mg/dL 0.3  0.4  0.5   Alkaline Phos 38 - 126 U/L 98  97  115   AST 15 - 41 U/L 17  16  20    ALT 0 - 44 U/L 17  17  22      Lab Results  Component Value Date   WBC 15.2 (H) 02/12/2023   HGB 14.5 02/12/2023   HCT 40.7 02/12/2023   MCV 110.9 (H) 02/12/2023   PLT 685 (H) 02/12/2023   NEUTROABS 10.4 (H) 02/12/2023    ASSESSMENT & PLAN:  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  He was found to have JAK2 positive  disease.  Current treatment: Hydroxyurea (1000 mg daily)  Hydroxyurea toxicities:   Dryness of the skin especially on the nose Restless legs: I renewed his prescription for gabapentin.   Lab review: 07/20/2022: WBC 17.3, hemoglobin 13.6, platelets 683, ANC 11.2, AMC 1.8 10/12/2022: WBC 14.6, hemoglobin 14.1, platelets 796, ANC 9.2, AMC 1.4 11/12/2022: WBC 14.1, hemoglobin 13.8, platelets 697 (currently on 1000 mg hydroxyurea daily) 11/26/2022: WBC 15.2, hemoglobin 14.5, platelets 685   Given the fact that the labs look relatively stable we will plan to obtain labs every 4 months and follow-up.     Orders Placed This Encounter  Procedures   CBC with Differential (Cancer Center Only)    Standing Status:   Future    Standing Expiration Date:   02/12/2024   The patient has a good understanding of the overall plan. he agrees with it. he will call with any problems that may develop before the next visit here. Total time spent: 30 mins including face to face time  and time spent for planning, charting and co-ordination of care   Tamsen Meek, MD 02/12/23    I Janan Ridge am acting as a Neurosurgeon for [employer]  I have reviewed the above documentation for accuracy and completeness, and I agree with the above.

## 2023-02-12 NOTE — Assessment & Plan Note (Addendum)
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  He was found to have JAK2 positive disease.  Current treatment: Hydroxyurea (1000 mg daily)  Hydroxyurea toxicities:   Dryness of the skin especially on the nose Restless legs: I renewed his prescription for gabapentin.   Lab review: 07/20/2022: WBC 17.3, hemoglobin 13.6, platelets 683, ANC 11.2, AMC 1.8 10/12/2022: WBC 14.6, hemoglobin 14.1, platelets 796, ANC 9.2, AMC 1.4 11/12/2022: WBC 14.1, hemoglobin 13.8, platelets 697 (currently on 1000 mg hydroxyurea daily) 11/26/2022: WBC 15.2, hemoglobin 14.5, platelets 685   Given the fact that the labs look relatively stable we will plan to obtain labs every 4 months and follow-up.

## 2023-03-07 ENCOUNTER — Other Ambulatory Visit: Payer: Self-pay | Admitting: Cardiology

## 2023-03-09 ENCOUNTER — Other Ambulatory Visit: Payer: Self-pay | Admitting: Student

## 2023-03-09 DIAGNOSIS — I10 Essential (primary) hypertension: Secondary | ICD-10-CM

## 2023-03-09 NOTE — Telephone Encounter (Signed)
Next appt scheduled 10/21 with Dr Carlynn Purl.

## 2023-03-29 ENCOUNTER — Ambulatory Visit: Payer: BC Managed Care – PPO | Admitting: Internal Medicine

## 2023-03-29 ENCOUNTER — Encounter: Payer: Self-pay | Admitting: Internal Medicine

## 2023-03-29 VITALS — BP 138/89 | HR 74 | Wt 161.8 lb

## 2023-03-29 DIAGNOSIS — E785 Hyperlipidemia, unspecified: Secondary | ICD-10-CM

## 2023-03-29 DIAGNOSIS — I10 Essential (primary) hypertension: Secondary | ICD-10-CM | POA: Diagnosis not present

## 2023-03-29 DIAGNOSIS — Z23 Encounter for immunization: Secondary | ICD-10-CM

## 2023-03-29 DIAGNOSIS — G629 Polyneuropathy, unspecified: Secondary | ICD-10-CM

## 2023-03-29 DIAGNOSIS — J439 Emphysema, unspecified: Secondary | ICD-10-CM

## 2023-03-29 DIAGNOSIS — J449 Chronic obstructive pulmonary disease, unspecified: Secondary | ICD-10-CM | POA: Diagnosis not present

## 2023-03-29 DIAGNOSIS — I739 Peripheral vascular disease, unspecified: Secondary | ICD-10-CM | POA: Diagnosis not present

## 2023-03-29 DIAGNOSIS — Z0000 Encounter for general adult medical examination without abnormal findings: Secondary | ICD-10-CM

## 2023-03-29 DIAGNOSIS — F17211 Nicotine dependence, cigarettes, in remission: Secondary | ICD-10-CM

## 2023-03-29 MED ORDER — HYDROCHLOROTHIAZIDE 12.5 MG PO CAPS
12.5000 mg | ORAL_CAPSULE | Freq: Every day | ORAL | 0 refills | Status: DC
Start: 2023-03-29 — End: 2023-05-05

## 2023-03-29 MED ORDER — GABAPENTIN 300 MG PO CAPS
600.0000 mg | ORAL_CAPSULE | Freq: Three times a day (TID) | ORAL | 3 refills | Status: DC
Start: 2023-03-29 — End: 2024-04-27

## 2023-03-29 MED ORDER — GABAPENTIN 300 MG PO CAPS
600.0000 mg | ORAL_CAPSULE | Freq: Three times a day (TID) | ORAL | 3 refills | Status: DC
Start: 2023-03-29 — End: 2023-03-29

## 2023-03-29 NOTE — Assessment & Plan Note (Addendum)
Flu shot given today

## 2023-03-29 NOTE — Progress Notes (Signed)
    Subjective:  CC: routine visit  HPI:  Brandon Lara is a 62 y.o. male with a past medical history stated below and presents today for above. Please see problem based assessment and plan for additional details.  Past Medical History:  Diagnosis Date   Alcohol use    6-7 beers/day (as of 01/21/21)   Aortoiliac occlusive disease (HCC) 02/06/2021   Blood transfusion without reported diagnosis 2022   Diverticulosis 02/21/2021   Incidentally notes on CT on 8/3   GERD (gastroesophageal reflux disease)    OTC PRN meds   Hyperlipidemia    Hypertension    Myalgia due to statin 11/12/2021   PAD (peripheral artery disease) (HCC)    Tobacco abuse     Current Outpatient Medications on File Prior to Visit  Medication Sig Dispense Refill   aspirin EC 81 MG tablet Take 81 mg by mouth daily. Swallow whole.     b complex vitamins capsule Take 1 capsule by mouth daily.     Evolocumab (REPATHA SURECLICK) 140 MG/ML SOAJ INJECT 140 MG INTO THE SKIN EVERY 14 DAYS 2 mL 11   hydroxyurea (HYDREA) 500 MG capsule Take 2 capsules (1,000 mg total) by mouth daily. 180 capsule 3   Multiple Vitamin (MULTIVITAMIN WITH MINERALS) TABS tablet Take 1 tablet by mouth daily.     polyethylene glycol (MIRALAX) 17 g packet Take 17 g by mouth daily. 30 each 2   No current facility-administered medications on file prior to visit.    Review of Systems: ROS negative except for as is noted on the assessment and plan.  Objective:   Vitals:   03/29/23 0919  BP: 138/89  Pulse: 74  SpO2: 99%  Weight: 161 lb 12.8 oz (73.4 kg)    Physical Exam: Constitutional: well-appearing, in no acute distress HENT: normocephalic atraumatic, mucous membranes moist Eyes: conjunctiva non-erythematous Neck: supple Cardiovascular: regular rate and rhythm, no m/r/g Pulmonary/Chest: normal work of breathing on room air, lungs clear to auscultation bilaterally Abdominal: soft, non-tender, non-distended MSK: normal bulk  and tone Neurological: alert & oriented x 3, 5/5 strength in bilateral upper and lower extremities, normal gait Skin: warm and dry  Assessment & Plan:  PAD (peripheral artery disease) (HCC) Reports some continued neuropathic type pain after right aortofemoral bypass performed in 2022. Gabapentin is effective for this pain as well as helpful for his restless leg syndrome and insomnia.  -Continue ASA 81mg   Hypertension BP today 138/89, at goal on current regimen hydrochlorothiazide 12.5mg  daily. Last BMP was within normal limits.  -Continue current regimen hydrochlorothiazide 12.5mg  daily -Check BMP at next visit  COPD (chronic obstructive pulmonary disease) (HCC) Has quit smoking with assistance of Chantix. Denies any current respiratory symptoms.  -Low dose chest CT for lung cancer screening ordered  Hyperlipidemia On Repatha for secondary prevention. Reports severe myalgias when he has been on statin therapy in the past. Last lipid panel was within goal.  -Continue Repatha -Check lipid panel at next visit in approximately 6 months  Health care maintenance Flu short given today  Peripheral neuropathy Gabapentin is effective for neuropathic pain after bypass procedure for PAD in right foot.  -Gabapentin refilled    Patient seen with Dr. Alan Ripper MD [facility]  PGY-1 Pager: [phone] Date 03/29/2023  Time 9:50 AM

## 2023-03-29 NOTE — Assessment & Plan Note (Signed)
Gabapentin is effective for neuropathic pain after bypass procedure for PAD in right foot.  -Gabapentin refilled

## 2023-03-29 NOTE — Assessment & Plan Note (Signed)
Has quit smoking with assistance of Chantix. Denies any current respiratory symptoms.  -Low dose chest CT for lung cancer screening ordered

## 2023-03-29 NOTE — Assessment & Plan Note (Addendum)
Reports some continued neuropathic type pain after right aortofemoral bypass performed in 2022. Gabapentin is effective for this pain as well as helpful for his restless leg syndrome and insomnia.  -Continue ASA 81mg 

## 2023-03-29 NOTE — Patient Instructions (Signed)
It was a pleasure to meet you today. We will plan to see you again in about six months for an annual wellness visit.   Dr Carlynn Purl

## 2023-03-29 NOTE — Assessment & Plan Note (Signed)
On Repatha for secondary prevention. Reports severe myalgias when he has been on statin therapy in the past. Last lipid panel was within goal.  -Continue Repatha -Check lipid panel at next visit in approximately 6 months

## 2023-03-29 NOTE — Assessment & Plan Note (Signed)
BP today 138/89, at goal on current regimen hydrochlorothiazide 12.5mg  daily. Last BMP was within normal limits.  -Continue current regimen hydrochlorothiazide 12.5mg  daily -Check BMP at next visit

## 2023-03-30 NOTE — Addendum Note (Signed)
Addended by: Burnell Blanks on: 03/30/2023 02:38 PM   Modules accepted: Level of Service

## 2023-03-30 NOTE — Progress Notes (Signed)
Internal Medicine Clinic Attending  I was physically present during the key portions of the resident provided service and participated in the medical decision making of patient's management care. I reviewed pertinent patient test results.  The assessment, diagnosis, and plan were formulated together and I agree with the documentation in the resident's note.  Reymundo Poll, MD

## 2023-05-05 ENCOUNTER — Other Ambulatory Visit: Payer: Self-pay | Admitting: Internal Medicine

## 2023-05-05 DIAGNOSIS — I10 Essential (primary) hypertension: Secondary | ICD-10-CM

## 2023-05-10 ENCOUNTER — Other Ambulatory Visit (HOSPITAL_COMMUNITY): Payer: Self-pay

## 2023-05-10 ENCOUNTER — Telehealth: Payer: Self-pay | Admitting: Pharmacy Technician

## 2023-05-10 NOTE — Telephone Encounter (Signed)
Pharmacy Patient Advocate Encounter   Received notification from CoverMyMeds that prior authorization for repatha is required/requested.   Insurance verification completed.   The patient is insured through [facility] .   Per test claim: PA required; PA submitted to above mentioned insurance via CoverMyMeds Key/confirmation #/EOC [id number] Status is pending

## 2023-05-10 NOTE — Telephone Encounter (Signed)
Pharmacy Patient Advocate Encounter  Received notification from [facility] that Prior Authorization for repatha has been APPROVED from 05/10/23 to 05/09/24. Ran test claim, Copay is $100.00- one month. This test claim was processed through [facility]- copay amounts may vary at other pharmacies due to pharmacy/plan contracts, or as the patient moves through the different stages of their insurance plan.   PA #/Case ID/Reference #: [id number]

## 2023-05-14 ENCOUNTER — Ambulatory Visit (HOSPITAL_BASED_OUTPATIENT_CLINIC_OR_DEPARTMENT_OTHER): Payer: BC Managed Care – PPO

## 2023-05-15 ENCOUNTER — Ambulatory Visit (HOSPITAL_BASED_OUTPATIENT_CLINIC_OR_DEPARTMENT_OTHER): Payer: BC Managed Care – PPO

## 2023-05-17 ENCOUNTER — Ambulatory Visit (HOSPITAL_COMMUNITY): Admission: RE | Admit: 2023-05-17 | Payer: BC Managed Care – PPO | Source: Ambulatory Visit

## 2023-05-19 ENCOUNTER — Ambulatory Visit (HOSPITAL_COMMUNITY)
Admission: RE | Admit: 2023-05-19 | Discharge: 2023-05-19 | Disposition: A | Discharge status: Home / Self Care | Payer: BC Managed Care – PPO | Source: Ambulatory Visit | Attending: Nurse Practitioner | Admitting: Nurse Practitioner

## 2023-05-19 DIAGNOSIS — I6523 Occlusion and stenosis of bilateral carotid arteries: Secondary | ICD-10-CM | POA: Insufficient documentation

## 2023-05-19 DIAGNOSIS — I700 Atherosclerosis of aorta: Secondary | ICD-10-CM | POA: Insufficient documentation

## 2023-05-19 DIAGNOSIS — I10 Essential (primary) hypertension: Secondary | ICD-10-CM | POA: Diagnosis present

## 2023-06-03 ENCOUNTER — Other Ambulatory Visit: Payer: Self-pay | Admitting: Student

## 2023-06-03 DIAGNOSIS — I10 Essential (primary) hypertension: Secondary | ICD-10-CM

## 2023-06-04 NOTE — Telephone Encounter (Signed)
Medication sent to pharmacy  

## 2023-06-21 ENCOUNTER — Inpatient Hospital Stay: Payer: No Typology Code available for payment source | Attending: Hematology and Oncology

## 2023-06-21 ENCOUNTER — Inpatient Hospital Stay (HOSPITAL_BASED_OUTPATIENT_CLINIC_OR_DEPARTMENT_OTHER): Payer: No Typology Code available for payment source | Admitting: Hematology and Oncology

## 2023-06-21 VITALS — BP 144/75 | HR 70 | Temp 97.4°F | Resp 18 | Ht 66.0 in | Wt 161.7 lb

## 2023-06-21 DIAGNOSIS — I999 Unspecified disorder of circulatory system: Secondary | ICD-10-CM | POA: Diagnosis not present

## 2023-06-21 DIAGNOSIS — D75839 Thrombocytosis, unspecified: Secondary | ICD-10-CM | POA: Insufficient documentation

## 2023-06-21 DIAGNOSIS — D473 Essential (hemorrhagic) thrombocythemia: Secondary | ICD-10-CM | POA: Diagnosis present

## 2023-06-21 DIAGNOSIS — Z79899 Other long term (current) drug therapy: Secondary | ICD-10-CM | POA: Diagnosis not present

## 2023-06-21 DIAGNOSIS — G2581 Restless legs syndrome: Secondary | ICD-10-CM | POA: Diagnosis not present

## 2023-06-21 DIAGNOSIS — L853 Xerosis cutis: Secondary | ICD-10-CM | POA: Diagnosis not present

## 2023-06-21 DIAGNOSIS — Z880 Allergy status to penicillin: Secondary | ICD-10-CM | POA: Diagnosis not present

## 2023-06-21 LAB — CBC WITH DIFFERENTIAL (CANCER CENTER ONLY)
Abs Immature Granulocytes: 0.33 10*3/uL — ABNORMAL HIGH (ref 0.00–0.07)
Basophils Absolute: 0.3 10*3/uL — ABNORMAL HIGH (ref 0.0–0.1)
Basophils Relative: 2 %
Eosinophils Absolute: 0.2 10*3/uL (ref 0.0–0.5)
Eosinophils Relative: 2 %
HCT: 43.5 % (ref 39.0–52.0)
Hemoglobin: 14.9 g/dL (ref 13.0–17.0)
Immature Granulocytes: 2 %
Lymphocytes Relative: 16 %
Lymphs Abs: 2.4 10*3/uL (ref 0.7–4.0)
MCH: 37.2 pg — ABNORMAL HIGH (ref 26.0–34.0)
MCHC: 34.3 g/dL (ref 30.0–36.0)
MCV: 108.5 fL — ABNORMAL HIGH (ref 80.0–100.0)
Monocytes Absolute: 1.7 10*3/uL — ABNORMAL HIGH (ref 0.1–1.0)
Monocytes Relative: 11 %
Neutro Abs: 10.2 10*3/uL — ABNORMAL HIGH (ref 1.7–7.7)
Neutrophils Relative %: 67 %
Platelet Count: 758 10*3/uL — ABNORMAL HIGH (ref 150–400)
RBC: 4.01 MIL/uL — ABNORMAL LOW (ref 4.22–5.81)
RDW: 13.4 % (ref 11.5–15.5)
WBC Count: 15.1 10*3/uL — ABNORMAL HIGH (ref 4.0–10.5)
nRBC: 0 % (ref 0.0–0.2)

## 2023-06-21 NOTE — Assessment & Plan Note (Signed)
 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  He was found to have JAK2 positive disease.  Current treatment: Hydroxyurea  (1000 mg daily)   Hydroxyurea  toxicities:   Dryness of the skin especially on the nose Restless legs: I renewed his prescription for gabapentin .   Lab review: 07/20/2022: WBC 17.3, hemoglobin 13.6, platelets 683, ANC 11.2, AMC 1.8 10/12/2022: WBC 14.6, hemoglobin 14.1, platelets 796, ANC 9.2, AMC 1.4 11/12/2022: WBC 14.1, hemoglobin 13.8, platelets 697 (currently on 1000 mg hydroxyurea  daily) 02/12/2023: WBC 15.2, hemoglobin 14.5, MCV 110.9, platelets 685   Given the fact that the labs look relatively stable we will plan to obtain labs every 4 months and follow-up.

## 2023-06-21 NOTE — Progress Notes (Signed)
 Patient Care Team: Renne Homans, MD as PCP - General Santo Stanly LABOR, MD as PCP - Cardiology (Cardiology)  DIAGNOSIS:  Encounter Diagnosis  Name Primary?   Essential thrombocytosis (HCC) Yes    CHIEF COMPLIANT: Follow-up of essential thrombocytosis  HISTORY OF PRESENT ILLNESS:  History of Present Illness   The patient, with a history of high platelet count managed with Hydrea , presents for a routine follow-up. He reports dry skin, a known side effect of Hydrea , but otherwise tolerates the medication well. He has been compliant with the medication regimen, taking two tablets daily. Despite this, his platelet count has increased slightly from the six hundreds to seven fifty eight. He also has a history of high white blood cell count, which has remained stable.  In addition to his hematological concerns, the patient has a history of vascular issues. He has had surgery for blockage in his legs in 2022 and recently had an ultrasound revealing some blockage going down to his left arm. He is scheduled to follow up with a vascular doctor in February.         ALLERGIES:  is allergic to zetia  [ezetimibe ], crestor  [rosuvastatin ], lipitor [atorvastatin ], and penicillins.  MEDICATIONS:  Current Outpatient Medications  Medication Sig Dispense Refill   aspirin  EC 81 MG tablet Take 81 mg by mouth daily. Swallow whole.     b complex vitamins capsule Take 1 capsule by mouth daily.     Evolocumab  (REPATHA  SURECLICK) 140 MG/ML SOAJ INJECT 140 MG INTO THE SKIN EVERY 14 DAYS 2 mL 11   gabapentin  (NEURONTIN ) 300 MG capsule Take 2 capsules (600 mg total) by mouth 3 (three) times daily. 180 capsule 3   hydrochlorothiazide  (MICROZIDE ) 12.5 MG capsule Take 1 capsule by mouth once daily 30 capsule 0   hydroxyurea  (HYDREA ) 500 MG capsule Take 2 capsules (1,000 mg total) by mouth daily. 180 capsule 3   Multiple Vitamin (MULTIVITAMIN WITH MINERALS) TABS tablet Take 1 tablet by mouth daily.      polyethylene glycol (MIRALAX ) 17 g packet Take 17 g by mouth daily. 30 each 2   No current facility-administered medications for this visit.    PHYSICAL EXAMINATION: ECOG PERFORMANCE STATUS: 1 - Symptomatic but completely ambulatory  Vitals:   06/21/23 0951  BP: (!) 144/75  Pulse: 70  Resp: 18  Temp: (!) 97.4 F (36.3 C)  SpO2: 100%   Filed Weights   06/21/23 0951  Weight: 161 lb 11.2 oz (73.3 kg)     LABORATORY DATA:  I have reviewed the data as listed    Latest Ref Rng & Units 10/12/2022    1:29 PM 07/20/2022    1:00 PM 06/22/2022    8:53 AM  CMP  Glucose 70 - 99 mg/dL 88  83    BUN 8 - 23 mg/dL 15  13    Creatinine 9.38 - 1.24 mg/dL 9.07  9.07    Sodium 864 - 145 mmol/L 139  139    Potassium 3.5 - 5.1 mmol/L 3.6  3.6    Chloride 98 - 111 mmol/L 101  101    CO2 22 - 32 mmol/L 31  31    Calcium  8.9 - 10.3 mg/dL 9.0  8.9    Total Protein 6.5 - 8.1 g/dL 7.1  7.1  6.5   Total Bilirubin 0.3 - 1.2 mg/dL 0.3  0.4  0.5   Alkaline Phos 38 - 126 U/L 98  97  115   AST 15 - 41 U/L 17  16  20   ALT 0 - 44 U/L 17  17  22      Lab Results  Component Value Date   WBC 15.1 (H) 06/21/2023   HGB 14.9 06/21/2023   HCT 43.5 06/21/2023   MCV 108.5 (H) 06/21/2023   PLT 758 (H) 06/21/2023   NEUTROABS 10.2 (H) 06/21/2023    ASSESSMENT & PLAN:  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  He was found to have JAK2 positive disease.  Current treatment: Hydroxyurea  (1000 mg daily)   Hydroxyurea  toxicities:   Dryness of the skin especially on the nose Restless legs: I renewed his prescription for gabapentin .   Lab review: 07/20/2022: WBC 17.3, hemoglobin 13.6, platelets 683, ANC 11.2, AMC 1.8 10/12/2022: WBC 14.6, hemoglobin 14.1, platelets 796, ANC 9.2, AMC 1.4 11/12/2022: WBC 14.1, hemoglobin 13.8, platelets 697 (currently on 1000 mg hydroxyurea  daily) 02/12/2023: WBC 15.2, hemoglobin 14.5, MCV 110.9, platelets 685   Given the fact that the  labs look relatively stable we will plan to obtain labs every 4 months and follow-up. ------------------------------------- Assessment and Plan    Essential Thrombocythemia Platelet count increased to 758 from 685, despite consistent use of Hydrea  2 tablets daily. Discussed the possibility of increasing Hydrea  dose or switching to a different medication if platelet count continues to rise. Noted patient's insurance coverage for Hydrea . -Continue Hydrea  2 tablets daily. -Check platelet count in 3 months to assess trend.  Vascular Disease Patient reports blockage in artery leading to left arm, identified on recent carotid ultrasound. Patient is scheduled to see a vascular specialist for follow-up. -Continue daily aspirin  for platelet inhibition. -Follow-up with vascular specialist as scheduled.  Dry Skin Likely side effect of Hydrea . -No change in management.          Orders Placed This Encounter  Procedures   CBC with Differential (Cancer Center Only)    Standing Status:   Future    Expiration Date:   06/20/2024   The patient has a good understanding of the overall plan. he agrees with it. he will call with any problems that may develop before the next visit here. Total time spent: 30 mins including face to face time and time spent for planning, charting and co-ordination of care   Brandon MARLA Chad, MD 06/21/23

## 2023-06-30 ENCOUNTER — Other Ambulatory Visit: Payer: Self-pay

## 2023-06-30 DIAGNOSIS — I739 Peripheral vascular disease, unspecified: Secondary | ICD-10-CM

## 2023-07-03 ENCOUNTER — Other Ambulatory Visit: Payer: Self-pay | Admitting: Student

## 2023-07-03 DIAGNOSIS — I10 Essential (primary) hypertension: Secondary | ICD-10-CM

## 2023-07-05 NOTE — Telephone Encounter (Signed)
Medication sent to pharmacy

## 2023-07-12 NOTE — Progress Notes (Signed)
 [facility] [place]  ASSESSMENT / PLAN: Brandon Lara is a 63 y.o. male with:  1) atherosclerosis of native arteries of aorta/iliac causing ischemic rest pain treated with aorto-bi-femoral bypass 02/06/21.  His ABI has normalized.  2) asymptomatic, moderate, bilateral carotid artery stenosis. Asymptomatic left subclavian artery stenosis. Asymptomatic, severe right vertebral artery stenosis  Recommend:  Complete cessation from all tobacco products. Blood glucose control with goal A1c < 7%. Blood pressure control with goal blood pressure < 140/90 mmHg. Lipid reduction therapy with goal LDL-C <100 mg/dL (<29 if symptomatic from PAD).  Aspirin  81mg  PO QD.  Atorvastatin  40-80mg  PO QD (or other high intensity statin therapy).  Follow up in 1 year with repeat ABI and carotid duplex.   CHIEF COMPLAINT: Bilateral leg pain  HISTORY OF PRESENT ILLNESS: Brandon Lara is a 63 y.o. male referred to clinic for evaluation of severe right lower extremity pain at rest.  He was seen by Dr. Heide with orthopedics who identified pulseless right leg and referred him for urgent evaluation.  The patient reports progressive symptoms of intermittent claudication which have worsened over the past several months.  He now has pain at rest.  The right leg is worse.  He has no ulceration about his lower extremities.  He is a longtime smoker.  He is otherwise healthy to the best of his knowledge.  03/18/21: Patient returns to clinic for postoperative evaluation he is doing well.  He has intermittent constipation.  He feels this is because of potassium limitation.  He continues to follow-up with internal medicine and hematology for his leukocytosis.  His feet feel improved.  He still has occasional numbness and paresthesia in his right foot, but this seems to be improving.  He is deconditioned.  His appetite has been diminished.  He has lost weight.  Globally, however, he feels  improved.  07/07/22: Patient returns to clinic for surveillance.  He still would ports some radicular and neuropathic type discomfort in his right lower extremity.  He reports pain in his right buttock radiating down the back of the right leg.  He also reports some sensory disturbance in his right foot.  Pain in the buttock is relieved by sitting.  Pain in the buttock is worsened by standing for long period of time.  He has no claudication or rest pain type symptoms.  07/13/23: Patient returns to clinic for surveillance.  He also had a carotid duplex performed which showed left subclavian artery stenosis and right vertebral artery near occlusion.  Patient is asymptomatic from a neurologic standpoint.  He has no symptoms in his left upper extremity.  Duplex was performed because of asymptomatic, asymmetric upper extremity blood pressure readings.  We reviewed these findings in detail.  We also reviewed his ABI, which remains normal.  He has no lower extremity symptoms.  VASCULAR SURGICAL HISTORY:  Aorto-bi-femoral bypass with 18x64mm Dacron graft. Omentopexy performed to cover retroperitoneum.   VASCULAR RISK FACTORS: Negative history of cerebrovascular disease / stroke / transient ischemic attack. Negative history of coronary artery disease.  Negative history of diabetes mellitus.  Positive history of smoking.  He has quit smoking. Negative history of hypertension.  Negative history of chronic kidney disease. Negative history of chronic obstructive pulmonary disease.  AMBULATORY STATUS: Ambulatory within the community without limits   Past medical history: denies Past surgical history: denies Family history: denies Social history: active smoker as above. Works as control and instrumentation engineer. Medications: none Allergies: NKDA  PHYSICAL EXAM There  were no vitals filed for this visit.    Constitutional: well appearing. no distress. Appears marginally nourished.  Neurologic: CN intact. no focal  findings. no sensory loss. Psychiatric:  Mood and affect symmetric and appropriate. Eyes:  No icterus. No conjunctival pallor. Ears, nose, throat:  mucous membranes moist. Midline trachea.  Cardiac: regular rate and rhythm.  Respiratory:  unlabored. Abdominal:  soft, non-tender, non-distended.   Peripheral vascular: 2+ femoral pulses. 2+ dorsalis pedis pulses. Extremity: no edema. no cyanosis. no pallor.  Skin: no gangrene. no ulceration.  Lymphatic: no Stemmer's sign. no palpable lymphadenopathy.  PERTINENT LABORATORY AND RADIOLOGIC DATA   +-------+-----------+-----------+------------+------------+  ABI/TBIToday's ABIToday's TBIPrevious ABIPrevious TBI  +-------+-----------+-----------+------------+------------+  Right 1.01       0.67       1.05        0.86          +-------+-----------+-----------+------------+------------+  Left  1.09       0.68       1.22        0.92          +-------+-----------+-----------+------------+------------+   Right Carotid: Velocities in the right ICA are consistent with a 40-59% stenosis. Non-hemodynamically significant plaque <50% noted in the CCA.   Left Carotid: Velocities in the left ICA are consistent with a 40-59%  stenosis.   Vertebrals: Left vertebral artery demonstrates antegrade flow. Right  vertebral artery demonstrates high resistant flow. The right vertebral artery appears nearly occluded.   Subclavians: Left subclavian artery was stenotic. Normal flow hemodynamics  were seen in the right subclavian artery. Right arm SBP 134 mmHg left arm SBP 118 mmHg.   Brandon Lara. Magda, MD [facility] Phone Number: [phone] 07/12/2023 7:42 AM

## 2023-07-13 ENCOUNTER — Ambulatory Visit (HOSPITAL_COMMUNITY)
Admission: RE | Admit: 2023-07-13 | Discharge: 2023-07-13 | Disposition: A | Discharge status: Home / Self Care | Payer: No Typology Code available for payment source | Source: Ambulatory Visit | Attending: Vascular Surgery | Admitting: Vascular Surgery

## 2023-07-13 ENCOUNTER — Ambulatory Visit (INDEPENDENT_AMBULATORY_CARE_PROVIDER_SITE_OTHER): Payer: No Typology Code available for payment source | Admitting: Vascular Surgery

## 2023-07-13 ENCOUNTER — Encounter: Payer: Self-pay | Admitting: Vascular Surgery

## 2023-07-13 VITALS — BP 154/84 | HR 77 | Temp 98.1°F | Resp 20 | Ht 66.0 in | Wt 161.0 lb

## 2023-07-13 DIAGNOSIS — I6501 Occlusion and stenosis of right vertebral artery: Secondary | ICD-10-CM | POA: Diagnosis not present

## 2023-07-13 DIAGNOSIS — I6523 Occlusion and stenosis of bilateral carotid arteries: Secondary | ICD-10-CM

## 2023-07-13 DIAGNOSIS — I771 Stricture of artery: Secondary | ICD-10-CM

## 2023-07-13 DIAGNOSIS — Z95828 Presence of other vascular implants and grafts: Secondary | ICD-10-CM | POA: Diagnosis not present

## 2023-07-13 DIAGNOSIS — I739 Peripheral vascular disease, unspecified: Secondary | ICD-10-CM | POA: Insufficient documentation

## 2023-07-13 LAB — VAS US ABI WITH/WO TBI
Left ABI: 1.28
Right ABI: 1.2

## 2023-07-14 ENCOUNTER — Telehealth: Payer: Self-pay

## 2023-07-14 ENCOUNTER — Other Ambulatory Visit (HOSPITAL_COMMUNITY): Payer: Self-pay

## 2023-07-14 NOTE — Telephone Encounter (Signed)
Pharmacy Patient Advocate Encounter   Received notification from CoverMyMeds that prior authorization for REPATHA is required/requested.   Insurance verification completed.   The patient is insured through [facility] .   Per test claim: PA required; PA submitted to above mentioned insurance via CoverMyMeds Key/confirmation #/EOC [id number] Status is pending

## 2023-07-15 ENCOUNTER — Other Ambulatory Visit (HOSPITAL_COMMUNITY): Payer: Self-pay

## 2023-07-15 NOTE — Telephone Encounter (Signed)
 Pharmacy Patient Advocate Encounter  Received notification from [employer] that Prior Authorization for REPATHA  has been APPROVED from 07/14/23 to 07/13/24. Ran test claim, Copay is $90. This test claim was processed through [facility]- copay amounts may vary at other pharmacies due to pharmacy/plan contracts, or as the patient moves through the different stages of their insurance plan.

## 2023-07-23 ENCOUNTER — Other Ambulatory Visit: Payer: Self-pay

## 2023-07-23 DIAGNOSIS — I739 Peripheral vascular disease, unspecified: Secondary | ICD-10-CM

## 2023-07-23 DIAGNOSIS — I6523 Occlusion and stenosis of bilateral carotid arteries: Secondary | ICD-10-CM

## 2023-08-13 ENCOUNTER — Ambulatory Visit (INDEPENDENT_AMBULATORY_CARE_PROVIDER_SITE_OTHER): Payer: BC Managed Care – PPO | Admitting: Student

## 2023-08-13 VITALS — BP 132/72 | HR 68 | Temp 97.5°F | Ht 66.0 in | Wt 161.9 lb

## 2023-08-13 DIAGNOSIS — I10 Essential (primary) hypertension: Secondary | ICD-10-CM | POA: Diagnosis not present

## 2023-08-13 DIAGNOSIS — F17211 Nicotine dependence, cigarettes, in remission: Secondary | ICD-10-CM | POA: Diagnosis not present

## 2023-08-13 DIAGNOSIS — E785 Hyperlipidemia, unspecified: Secondary | ICD-10-CM

## 2023-08-13 DIAGNOSIS — F17290 Nicotine dependence, other tobacco product, uncomplicated: Secondary | ICD-10-CM

## 2023-08-13 DIAGNOSIS — Z0000 Encounter for general adult medical examination without abnormal findings: Secondary | ICD-10-CM

## 2023-08-13 DIAGNOSIS — I739 Peripheral vascular disease, unspecified: Secondary | ICD-10-CM | POA: Diagnosis not present

## 2023-08-13 DIAGNOSIS — Z1211 Encounter for screening for malignant neoplasm of colon: Secondary | ICD-10-CM | POA: Diagnosis not present

## 2023-08-13 DIAGNOSIS — Z122 Encounter for screening for malignant neoplasm of respiratory organs: Secondary | ICD-10-CM

## 2023-08-13 DIAGNOSIS — E782 Mixed hyperlipidemia: Secondary | ICD-10-CM

## 2023-08-13 DIAGNOSIS — Z87891 Personal history of nicotine dependence: Secondary | ICD-10-CM | POA: Insufficient documentation

## 2023-08-13 NOTE — Assessment & Plan Note (Addendum)
 BP Readings from Last 3 Encounters:  08/13/23 132/72  07/13/23 (!) 154/84  06/21/23 (!) 144/75  Patient blood pressure in the office today is at borderline goal of 132/72.  I do not think there is any indication to adjust antihypertensive at this time.  Patient is advised to check his blood pressure at home and if it is persistently high, he should give [place] a call for medication modification.  I am also getting a BMP today to assess electrolytes since he has been on thiazide for a while now.  He however denies any chest pain, palpitations or shortness of breath.  Will follow-up with this patient in 2 to 3 months.

## 2023-08-13 NOTE — Assessment & Plan Note (Addendum)
 History of hyperlipidemia on Repatha.  His lipid profile was at goal a year ago.  Will recheck it again today to see if there is any need for medication modification. -Continue Repatha 140 mg every 14 days

## 2023-08-13 NOTE — Patient Instructions (Addendum)
 Thank you, [name] for allowing [place] to provide your care today. Today we discussed your general health.I will get labs today to access your kidney function.   I will send you for the repeat colonoscopy and also get a chest CT to assess for lung cancers.  BP Readings from Last 3 Encounters:  08/13/23 132/72  07/13/23 (!) 154/84  06/21/23 (!) 144/75     I have ordered the following labs for you:  Lab Orders         Lipid Profile         Basic metabolic panel      Tests ordered today:    Referrals ordered today:   Referral Orders         Ambulatory referral to Gastroenterology      I have ordered the following medication/changed the following medications:   Stop the following medications: There are no discontinued medications.   Start the following medications: No orders of the defined types were placed in this encounter.    Follow up: 3 months   Remember:   Should you have any questions or concerns please call the internal medicine clinic at [phone].    Kathleen Lime, M.D [facility]

## 2023-08-13 NOTE — Progress Notes (Addendum)
 CC: Annual Wellness Visit   HPI:  [name] is a 63 y.o. male living with a history stated below and presents today for annual wellness visit . Please see problem based assessment and plan for additional details.  Past Medical History:  Diagnosis Date   Alcohol use    6-7 beers/day (as of 01/21/21)   Aortoiliac occlusive disease (HCC) 02/06/2021   Blood transfusion without reported diagnosis 2022   Diverticulosis 02/21/2021   Incidentally notes on CT on 8/3   GERD (gastroesophageal reflux disease)    OTC PRN meds   Hyperlipidemia    Hypertension    Myalgia due to statin 11/12/2021   PAD (peripheral artery disease) (HCC)    Tobacco abuse     Current Outpatient Medications on File Prior to Visit  Medication Sig Dispense Refill   aspirin EC 81 MG tablet Take 81 mg by mouth daily. Swallow whole.     b complex vitamins capsule Take 1 capsule by mouth daily.     Evolocumab (REPATHA SURECLICK) 140 MG/ML SOAJ INJECT 140 MG INTO THE SKIN EVERY 14 DAYS 2 mL 11   gabapentin (NEURONTIN) 300 MG capsule Take 2 capsules (600 mg total) by mouth 3 (three) times daily. 180 capsule 3   hydrochlorothiazide (MICROZIDE) 12.5 MG capsule Take 1 capsule by mouth once daily 30 capsule 3   hydroxyurea (HYDREA) 500 MG capsule Take 2 capsules (1,000 mg total) by mouth daily. 180 capsule 3   Multiple Vitamin (MULTIVITAMIN WITH MINERALS) TABS tablet Take 1 tablet by mouth daily.     polyethylene glycol (MIRALAX) 17 g packet Take 17 g by mouth daily. 30 each 2   No current facility-administered medications on file prior to visit.    Family History  Problem Relation Age of Onset   Kidney cancer Mother    Colon cancer Neg Hx    Stomach cancer Neg Hx    Esophageal cancer Neg Hx     Social History   Socioeconomic History   Marital status: Single    Spouse name: Not on file   Number of children: Not on file   Years of education: Not on file   Highest education level: Not on file   Occupational History   Not on file  Tobacco Use   Smoking status: Former    Current packs/day: 0.00    Types: Cigarettes    Quit date: 01/22/2021    Years since quitting: 2.5   Smokeless tobacco: Never   Tobacco comments:           Vaping Use   Vaping status: Never Used  Substance and Sexual Activity   Alcohol use: Yes    Alcohol/week: 4.0 standard drinks of alcohol    Types: 4 Cans of beer per week    Comment: Beer sometimes (cut back on alcohol since 2022 surgery)   Drug use: Never   Sexual activity: Not on file  Other Topics Concern   Not on file  Social History Narrative   Not on file   Social Drivers of Health   Financial Resource Strain: Not on file  Food Insecurity: No Food Insecurity (08/10/2022)   Hunger Vital Sign    Worried About Running Out of Food in the Last Year: Never true    Ran Out of Food in the Last Year: Never true  Transportation Needs: No Transportation Needs (08/10/2022)   PRAPARE - Transportation    Lack of Transportation (Medical): No    Lack of  Transportation (Non-Medical): No  Physical Activity: Not on file  Stress: Not on file  Social Connections: Not on file  Intimate Partner Violence: Not At Risk (08/10/2022)   Humiliation, Afraid, Rape, and Kick questionnaire    Fear of Current or Ex-Partner: No    Emotionally Abused: No    Physically Abused: No    Sexually Abused: No    Review of Systems: ROS negative except for what is noted on the assessment and plan.  Vitals:   08/13/23 1013 08/13/23 1045  BP: (!) 152/76 132/72  Pulse: 68 68  Temp: (!) 97.5 F (36.4 C)   TempSrc: Oral   SpO2: 100%   Weight: 161 lb 14.4 oz (73.4 kg)   Height: 5\' 6"  (1.676 m)     Physical Exam: Constitutional: well-appearing man , sitting in chair , in no acute distress HENT: normocephalic atraumatic, mucous membranes moist Eyes: conjunctiva non-erythematous Cardiovascular: regular rate and rhythm, no m/r/g Pulmonary/Chest: normal work of breathing on  room air, lungs clear to auscultation bilaterally Abdominal: soft, non-tender, non-distended MSK: normal bulk and tone Neurological: alert & oriented x 3, no focal deficit Skin: warm and dry Psych: normal mood and behavior  Assessment & Plan:   PAD (peripheral artery disease) (HCC) Chronically stable on aspirin 81 mg daily. No  active concerns at this time  Hypertension BP Readings from Last 3 Encounters:  08/13/23 132/72  07/13/23 (!) 154/84  06/21/23 (!) 144/75  Patient blood pressure in the office today is at borderline goal of 132/72.  I do not think there is any indication to adjust antihypertensive at this time.  Patient is advised to check his blood pressure at home and if it is persistently high, he should give [place] a call for medication modification.  I am also getting a BMP today to assess electrolytes since he has been on thiazide for a while now.  He however denies any chest pain, palpitations or shortness of breath.  Will follow-up with this patient in 2 to 3 months.  Hyperlipidemia History of hyperlipidemia on Repatha.  His lipid profile was at goal a year ago.  Will recheck it again today to see if there is any need for medication modification. -Continue Repatha 140 mg every 14 days   Former heavy tobacco smoker [name] is a 63 year old male who has a history of heavy cigarette smoking.  He reports he used to smoke more than a pack a day since his teenage years.  He currently does not smoke cigarettes but does smoke cigars every now and then.  Since he has  had more than 20 pack year history of smoking and has not stopped in the last 15 years,I think  he meets criteria for low-dose CT chest for lung cancer screening. - Schedule low-dose CT chest    Patient discussed with Dr. Rip Harbour, M.D [facility] Phone: [phone] Date 08/14/2023 Time 1:03 AM

## 2023-08-13 NOTE — Assessment & Plan Note (Addendum)
 Chronically stable on aspirin 81 mg daily. No  active concerns at this time

## 2023-08-13 NOTE — Assessment & Plan Note (Addendum)
 [name] is a 63 year old male who has a history of heavy cigarette smoking.  He reports he used to smoke more than a pack a day since his teenage years.  He currently does not smoke cigarettes but does smoke cigars every now and then.  Since he has  had more than 20 pack year history of smoking and has not stopped in the last 15 years,I think  he meets criteria for low-dose CT chest for lung cancer screening. - Schedule low-dose CT chest

## 2023-08-14 LAB — LIPID PANEL
Chol/HDL Ratio: 2.4 ratio (ref 0.0–5.0)
Cholesterol, Total: 90 mg/dL — ABNORMAL LOW (ref 100–199)
HDL: 38 mg/dL — ABNORMAL LOW (ref >39)
LDL Chol Calc (NIH): 28 mg/dL (ref 0–99)
Triglycerides: 142 mg/dL (ref 0–149)
VLDL Cholesterol Cal: 24 mg/dL (ref 5–40)

## 2023-08-14 LAB — BASIC METABOLIC PANEL WITH GFR
BUN/Creatinine Ratio: 12 (ref 10–24)
BUN: 10 mg/dL (ref 8–27)
CO2: 26 mmol/L (ref 20–29)
Calcium: 9.4 mg/dL (ref 8.6–10.2)
Chloride: 100 mmol/L (ref 96–106)
Creatinine, Ser: 0.86 mg/dL (ref 0.76–1.27)
Glucose: 75 mg/dL (ref 70–99)
Potassium: 4.9 mmol/L (ref 3.5–5.2)
Sodium: 140 mmol/L (ref 134–144)
eGFR: 99 mL/min/1.73

## 2023-08-16 NOTE — Progress Notes (Signed)
 Internal Medicine Clinic Attending  Case discussed with the resident at the time of the visit.  We reviewed the resident's history and exam and pertinent patient test results.  I agree with the assessment, diagnosis, and plan of care documented in the resident's note.

## 2023-09-20 ENCOUNTER — Other Ambulatory Visit: Payer: Self-pay

## 2023-09-20 ENCOUNTER — Inpatient Hospital Stay: Payer: No Typology Code available for payment source | Attending: Hematology and Oncology

## 2023-09-20 DIAGNOSIS — Z79899 Other long term (current) drug therapy: Secondary | ICD-10-CM | POA: Insufficient documentation

## 2023-09-20 DIAGNOSIS — D75839 Thrombocytosis, unspecified: Secondary | ICD-10-CM | POA: Diagnosis present

## 2023-09-20 DIAGNOSIS — D473 Essential (hemorrhagic) thrombocythemia: Secondary | ICD-10-CM | POA: Diagnosis present

## 2023-09-20 LAB — CBC WITH DIFFERENTIAL (CANCER CENTER ONLY)
Abs Immature Granulocytes: 0.53 10*3/uL — ABNORMAL HIGH (ref 0.00–0.07)
Basophils Absolute: 0.3 10*3/uL — ABNORMAL HIGH (ref 0.0–0.1)
Basophils Relative: 2 %
Eosinophils Absolute: 0.3 10*3/uL (ref 0.0–0.5)
Eosinophils Relative: 2 %
HCT: 42.8 % (ref 39.0–52.0)
Hemoglobin: 14.5 g/dL (ref 13.0–17.0)
Immature Granulocytes: 3 %
Lymphocytes Relative: 14 %
Lymphs Abs: 2.6 10*3/uL (ref 0.7–4.0)
MCH: 37.2 pg — ABNORMAL HIGH (ref 26.0–34.0)
MCHC: 33.9 g/dL (ref 30.0–36.0)
MCV: 109.7 fL — ABNORMAL HIGH (ref 80.0–100.0)
Monocytes Absolute: 2 10*3/uL — ABNORMAL HIGH (ref 0.1–1.0)
Monocytes Relative: 11 %
Neutro Abs: 12.2 10*3/uL — ABNORMAL HIGH (ref 1.7–7.7)
Neutrophils Relative %: 68 %
Platelet Count: 765 10*3/uL — ABNORMAL HIGH (ref 150–400)
RBC: 3.9 MIL/uL — ABNORMAL LOW (ref 4.22–5.81)
RDW: 13.2 % (ref 11.5–15.5)
WBC Count: 17.8 10*3/uL — ABNORMAL HIGH (ref 4.0–10.5)
nRBC: 0 % (ref 0.0–0.2)

## 2023-10-04 ENCOUNTER — Other Ambulatory Visit: Payer: Self-pay | Admitting: Hematology and Oncology

## 2023-10-04 DIAGNOSIS — D72829 Elevated white blood cell count, unspecified: Secondary | ICD-10-CM

## 2023-10-27 ENCOUNTER — Other Ambulatory Visit: Payer: Self-pay | Admitting: Student

## 2023-10-27 DIAGNOSIS — I10 Essential (primary) hypertension: Secondary | ICD-10-CM

## 2023-10-27 NOTE — Telephone Encounter (Signed)
 Medication sent to pharmacy

## 2023-11-11 ENCOUNTER — Ambulatory Visit (AMBULATORY_SURGERY_CENTER)

## 2023-11-11 ENCOUNTER — Encounter: Payer: Self-pay | Admitting: Gastroenterology

## 2023-11-11 VITALS — Ht 66.0 in | Wt 162.0 lb

## 2023-11-11 DIAGNOSIS — Z860100 Personal history of colon polyps, unspecified: Secondary | ICD-10-CM

## 2023-11-11 MED ORDER — NA SULFATE-K SULFATE-MG SULF 17.5-3.13-1.6 GM/177ML PO SOLN
1.0000 | Freq: Once | ORAL | 0 refills | Status: AC
Start: 2023-11-11 — End: 2023-11-11

## 2023-11-11 NOTE — Progress Notes (Signed)

## 2023-11-24 ENCOUNTER — Other Ambulatory Visit: Payer: Self-pay | Admitting: Student

## 2023-11-24 DIAGNOSIS — I10 Essential (primary) hypertension: Secondary | ICD-10-CM

## 2023-11-24 NOTE — Telephone Encounter (Signed)
 Medication sent to pharmacy

## 2023-11-25 ENCOUNTER — Encounter: Payer: Self-pay | Admitting: Gastroenterology

## 2023-11-25 ENCOUNTER — Ambulatory Visit: Admitting: Gastroenterology

## 2023-11-25 VITALS — BP 108/69 | HR 73 | Temp 98.8°F | Resp 15 | Ht 66.0 in | Wt 162.0 lb

## 2023-11-25 DIAGNOSIS — D128 Benign neoplasm of rectum: Secondary | ICD-10-CM | POA: Diagnosis not present

## 2023-11-25 DIAGNOSIS — K648 Other hemorrhoids: Secondary | ICD-10-CM | POA: Diagnosis not present

## 2023-11-25 DIAGNOSIS — Z1211 Encounter for screening for malignant neoplasm of colon: Secondary | ICD-10-CM | POA: Diagnosis present

## 2023-11-25 DIAGNOSIS — K644 Residual hemorrhoidal skin tags: Secondary | ICD-10-CM

## 2023-11-25 DIAGNOSIS — Z860100 Personal history of colon polyps, unspecified: Secondary | ICD-10-CM

## 2023-11-25 MED ORDER — SODIUM CHLORIDE 0.9 % IV SOLN: 500.0000 mL | Freq: Once | INTRAVENOUS | Status: DC

## 2023-11-25 NOTE — Progress Notes (Unsigned)
 Sedate, gd SR, tolerated procedure well, VSS, report to RN

## 2023-11-25 NOTE — Progress Notes (Unsigned)
 [facility] History and Physical   Primary Care Physician:  Fay Hoop, MD   Reason for Procedure:  History of adenomatous colon polyps  Plan:    Surveillance colonoscopy with possible interventions as needed     HPI: Brandon Lara is a very pleasant 63 y.o. male here for surveillance colonoscopy. Denies any nausea, vomiting, abdominal pain, melena or bright red blood per rectum  The risks and benefits as well as alternatives of endoscopic procedure(s) have been discussed and reviewed. All questions answered. The patient agrees to proceed.    Past Medical History:  Diagnosis Date   Alcohol use    6-7 beers/day (as of 01/21/21)   Aortoiliac occlusive disease (HCC) 02/06/2021   Blood transfusion without reported diagnosis 2022   Diverticulosis 02/21/2021   Incidentally notes on CT on 8/3   GERD (gastroesophageal reflux disease)    OTC PRN meds   Hyperlipidemia    Hypertension    Myalgia due to statin 11/12/2021   PAD (peripheral artery disease) (HCC)    Tobacco abuse     Past Surgical History:  Procedure Laterality Date   AORTA - BILATERAL FEMORAL ARTERY BYPASS GRAFT Bilateral 02/06/2021   Procedure: AORTA BIFEMORAL BYPASS GRAFT with Omentopexy;  Surgeon: Carlene Che, MD;  Location: [facility] OR;  Service: Vascular;  Laterality: Bilateral;   COLONOSCOPY  04/09/2022   KN-MAC-prep adeq-int hems/TA x5/tubulovillous adenoma   WISDOM TOOTH EXTRACTION      Prior to Admission medications   Medication Sig Start Date End Date Taking? Authorizing Provider  aspirin  EC 81 MG tablet Take 81 mg by mouth daily. Swallow whole.   Yes [provider]  b complex vitamins capsule Take 1 capsule by mouth daily. 07/20/22  Yes Gudena, Vinay, MD  gabapentin  (NEURONTIN ) 300 MG capsule Take 2 capsules (600 mg total) by mouth 3 (three) times daily. 03/29/23  Yes Jayson Michael, MD  hydrochlorothiazide  (MICROZIDE ) 12.5 MG capsule Take 1 capsule by mouth once daily  11/24/23  Yes Amoako, Prince, MD  hydroxyurea  (HYDREA ) 500 MG capsule Take 2 capsules by mouth once daily 10/04/23  Yes Gudena, Vinay, MD  polyethylene glycol (MIRALAX ) 17 g packet Take 17 g by mouth daily. 03/21/21  Yes Renna Cary, MD  Evolocumab  (REPATHA  SURECLICK) 140 MG/ML SOAJ INJECT 140 MG INTO THE SKIN EVERY 14 DAYS 03/08/23   Jann Melody, MD  Multiple Vitamin (MULTIVITAMIN WITH MINERALS) TABS tablet Take 1 tablet by mouth daily. Patient not taking: Reported on 11/11/2023 02/16/21   Rhyne, Samantha J, PA-C    Current Outpatient Medications  Medication Sig Dispense Refill   aspirin  EC 81 MG tablet Take 81 mg by mouth daily. Swallow whole.     b complex vitamins capsule Take 1 capsule by mouth daily.     gabapentin  (NEURONTIN ) 300 MG capsule Take 2 capsules (600 mg total) by mouth 3 (three) times daily. 180 capsule 3   hydrochlorothiazide  (MICROZIDE ) 12.5 MG capsule Take 1 capsule by mouth once daily 30 capsule 0   hydroxyurea  (HYDREA ) 500 MG capsule Take 2 capsules by mouth once daily 180 capsule 0   polyethylene glycol (MIRALAX ) 17 g packet Take 17 g by mouth daily. 30 each 2   Evolocumab  (REPATHA  SURECLICK) 140 MG/ML SOAJ INJECT 140 MG INTO THE SKIN EVERY 14 DAYS 2 mL 11   Multiple Vitamin (MULTIVITAMIN WITH MINERALS) TABS tablet Take 1 tablet by mouth daily. (Patient not taking: Reported on 11/11/2023)     Current Facility-Administered Medications  Medication  Dose Route Frequency Provider Last Rate Last Admin   0.9 %  sodium chloride  infusion  500 mL Intravenous Once Harshaan Whang V, MD        Allergies as of 11/25/2023 - Review Complete 11/25/2023  Allergen Reaction Noted   Zetia  [ezetimibe ] Other (See Comments) 04/09/2022   Crestor  [rosuvastatin ] Other (See Comments) 04/09/2022   Lipitor [atorvastatin ] Other (See Comments) 04/09/2022   Penicillins Rash 01/07/2021    Family History  Problem Relation Age of Onset   Kidney cancer Mother    Colon cancer Neg Hx     Stomach cancer Neg Hx    Esophageal cancer Neg Hx    Rectal cancer Neg Hx     Social History   Socioeconomic History   Marital status: Single    Spouse name: Not on file   Number of children: Not on file   Years of education: Not on file   Highest education level: Not on file  Occupational History   Not on file  Tobacco Use   Smoking status: Former    Current packs/day: 0.00    Types: Cigarettes    Quit date: 01/22/2021    Years since quitting: 2.8   Smokeless tobacco: Never   Tobacco comments:           Vaping Use   Vaping status: Never Used  Substance and Sexual Activity   Alcohol use: Yes    Alcohol/week: 4.0 standard drinks of alcohol    Types: 4 Cans of beer per week    Comment: Beer sometimes (cut back on alcohol since 2022 surgery)   Drug use: Never   Sexual activity: Not on file  Other Topics Concern   Not on file  Social History Narrative   Not on file   Social Drivers of Health   Financial Resource Strain: Not on file  Food Insecurity: No Food Insecurity (08/10/2022)   Hunger Vital Sign    Worried About Running Out of Food in the Last Year: Never true    Ran Out of Food in the Last Year: Never true  Transportation Needs: No Transportation Needs (08/10/2022)   PRAPARE - Administrator, Civil Service (Medical): No    Lack of Transportation (Non-Medical): No  Physical Activity: Not on file  Stress: Not on file  Social Connections: Not on file  Intimate Partner Violence: Not At Risk (08/10/2022)   Humiliation, Afraid, Rape, and Kick questionnaire    Fear of Current or Ex-Partner: No    Emotionally Abused: No    Physically Abused: No    Sexually Abused: No    Review of Systems:  All other review of systems negative except as mentioned in the HPI.  Physical Exam: Vital signs in last 24 hours: BP (!) 144/89   Pulse 89   Temp 98.8 F (37.1 C)   Ht 5' 6 (1.676 m)   Wt 162 lb (73.5 kg)   SpO2 96%   BMI 26.15 kg/m  General:   Alert,  NAD Lungs:  Clear .   Heart:  Regular rate and rhythm Abdomen:  Soft, nontender and nondistended. Neuro/Psych:  Alert and cooperative. Normal mood and affect. A and O x 3  Reviewed labs, radiology imaging, old records and pertinent past GI work up  Patient is appropriate for planned procedure(s) and anesthesia in an ambulatory setting   K. Veena Inetha Maret , MD [phone]

## 2023-11-25 NOTE — Progress Notes (Signed)
 Pt's states no medical or surgical changes since previsit or office visit.

## 2023-11-25 NOTE — Op Note (Signed)
 [facility] Patient Name: Brandon Lara Procedure Date: 11/25/2023 2:14 PM MRN: [id number] Endoscopist: Sergio Dandy , MD, [id number] Age: 63 Referring MD:  Date of Birth: [birth year] Gender: Male Account #: [id number] Procedure:                Colonoscopy Indications:              High risk colon cancer surveillance: Personal                            history of colonic polyps, Surveillance: Piecemeal                            removal of large sessile adenoma last colonoscopy                            (< 3 yrs) 2023, High risk colon cancer                            surveillance: Personal history of adenoma (10 mm or                            greater in size), High risk colon cancer                            surveillance: Personal history of multiple (3 or                            more) adenomas Medicines:                Monitored Anesthesia Care Procedure:                Pre-Anesthesia Assessment:                           - Prior to the procedure, a History and Physical                            was performed, and patient medications and                            allergies were reviewed. The patient's tolerance of                            previous anesthesia was also reviewed. The risks                            and benefits of the procedure and the sedation                            options and risks were discussed with the patient.                            All questions were answered, and informed consent  was obtained. Prior Anticoagulants: The patient has                            taken no anticoagulant or antiplatelet agents. ASA                            Grade Assessment: II - A patient with mild systemic                            disease. After reviewing the risks and benefits,                            the patient was deemed in satisfactory condition to                            undergo the procedure.                            After obtaining informed consent, the colonoscope                            was passed under direct vision. Throughout the                            procedure, the patient's blood pressure, pulse, and                            oxygen saturations were monitored continuously. The                            [id number]-HQ190L Colonoscope [id number] was introduced                            through the anus and advanced to the the cecum,                            identified by appendiceal orifice and ileocecal                            valve. The colonoscopy was performed without                            difficulty. The patient tolerated the procedure                            well. The quality of the bowel preparation was                            evaluated using the BBPS [facility] Bowel Preparation                            Scale) with scores of: Right Colon = 1 (portion of  mucosa seen, but other areas not well seen due to                            staining, residual stool and/or opaque liquid),                            Transverse Colon = 2 (minor amount of residual                            staining, small fragments of stool and/or opaque                            liquid, but mucosa seen well) and Left Colon = 3                            (entire mucosa seen well with no residual staining,                            small fragments of stool or opaque liquid). The                            total BBPS score equals 6. The quality of the bowel                            preparation was inadequate. Scope In: 2:20:52 PM Scope Out: 2:30:39 PM Scope Withdrawal Time: 0 hours 7 minutes 18 seconds  Total Procedure Duration: 0 hours 9 minutes 47 seconds  Findings:                 The perianal and digital rectal examinations were                            normal.                           A 10 mm polyp was found in the rectum. The polyp                             was sessile. The polyp was removed with a cold                            snare. Resection and retrieval were complete.                           Non-bleeding external and internal hemorrhoids were                            found during retroflexion. The hemorrhoids were                            small. Complications:            No immediate complications. Estimated Blood Loss:     Estimated blood loss was minimal. Impression:               -  Preparation of the colon was inadequate.                           - One 10 mm polyp in the rectum, removed with a                            cold snare. Resected and retrieved.                           - Non-bleeding external and internal hemorrhoids. Recommendation:           - Patient has a contact number available for                            emergencies. The signs and symptoms of potential                            delayed complications were discussed with the                            patient. Return to normal activities tomorrow.                            Written discharge instructions were provided to the                            patient.                           - Resume previous diet.                           - Continue present medications.                           - Await pathology results.                           - Repeat colonoscopy in 1 year because the bowel                            preparation was suboptimal and for surveillance.                           - For future colonoscopy the patient will require                            an extended preparation. If there are any                            questions, please contact the gastroenterologist. Sergio Dandy, MD 11/25/2023 2:40:25 PM This report has been signed electronically.

## 2023-11-25 NOTE — Patient Instructions (Signed)
 Resume previous diet Continue present medications Await pathology results Repeat colonoscopy in 1 year because the bowel prep was suboptimal     YOU HAD AN ENDOSCOPIC PROCEDURE TODAY AT THE [facility]:   Refer to the procedure report that was given to you for any specific questions about what was found during the examination.  If the procedure report does not answer your questions, please call your gastroenterologist to clarify.  If you requested that your care partner not be given the details of your procedure findings, then the procedure report has been included in a sealed envelope for you to review at your convenience later.  YOU SHOULD EXPECT: Some feelings of bloating in the abdomen. Passage of more gas than usual.  Walking can help get rid of the air that was put into your GI tract during the procedure and reduce the bloating. If you had a lower endoscopy (such as a colonoscopy or flexible sigmoidoscopy) you may notice spotting of blood in your stool or on the toilet paper. If you underwent a bowel prep for your procedure, you may not have a normal bowel movement for a few days.  Please Note:  You might notice some irritation and congestion in your nose or some drainage.  This is from the oxygen used during your procedure.  There is no need for concern and it should clear up in a day or so.  SYMPTOMS TO REPORT IMMEDIATELY:  Following lower endoscopy (colonoscopy or flexible sigmoidoscopy):  Excessive amounts of blood in the stool  Significant tenderness or worsening of abdominal pains  Swelling of the abdomen that is new, acute  Fever of 100F or higher   For urgent or emergent issues, a gastroenterologist can be reached at any hour by calling [phone]. Do not use MyChart messaging for urgent concerns.    DIET:  We do recommend a small meal at first, but then you may proceed to your regular diet.  Drink plenty of fluids but you should avoid alcoholic beverages  for 24 hours.  ACTIVITY:  You should plan to take it easy for the rest of today and you should NOT DRIVE or use heavy machinery until tomorrow (because of the sedation medicines used during the test).    FOLLOW UP: Our staff will call the number listed on your records the next business day following your procedure.  We will call around 7:15- 8:00 am to check on you and address any questions or concerns that you may have regarding the information given to you following your procedure. If we do not reach you, we will leave a message.     If any biopsies were taken you will be contacted by phone or by letter within the next 1-3 weeks.  Please call us  at [phone] if you have not heard about the biopsies in 3 weeks.    SIGNATURES/CONFIDENTIALITY: You and/or your care partner have signed paperwork which will be entered into your electronic medical record.  These signatures attest to the fact that that the information above on your After Visit Summary has been reviewed and is understood.  Full responsibility of the confidentiality of this discharge information lies with you and/or your care-partner.

## 2023-11-26 ENCOUNTER — Telehealth: Payer: Self-pay

## 2023-11-26 NOTE — Telephone Encounter (Signed)
  Follow up Call-     11/25/2023    2:00 PM 05/27/2022    2:42 PM 05/27/2022    2:38 PM 04/09/2022    7:14 AM  Call back number  Post procedure Call Back phone  # [phone]  [phone]  Permission to leave phone message Yes  Yes Yes     Patient questions:  Do you have a fever, pain , or abdominal swelling? No. Pain Score  0 *  Have you tolerated food without any problems? Yes.    Have you been able to return to your normal activities? Yes.    Do you have any questions about your discharge instructions: Diet   No. Medications  No. Follow up visit  No.  Do you have questions or concerns about your Care? No.  Actions: * If pain score is 4 or above: No action needed, pain <4.

## 2023-11-30 LAB — SURGICAL PATHOLOGY

## 2023-12-23 ENCOUNTER — Other Ambulatory Visit: Payer: Self-pay | Admitting: Student

## 2023-12-23 DIAGNOSIS — I10 Essential (primary) hypertension: Secondary | ICD-10-CM

## 2024-01-02 ENCOUNTER — Other Ambulatory Visit: Payer: Self-pay | Admitting: Hematology and Oncology

## 2024-01-02 DIAGNOSIS — D72829 Elevated white blood cell count, unspecified: Secondary | ICD-10-CM

## 2024-01-19 ENCOUNTER — Other Ambulatory Visit: Payer: Self-pay | Admitting: Student

## 2024-01-19 DIAGNOSIS — I10 Essential (primary) hypertension: Secondary | ICD-10-CM

## 2024-01-19 NOTE — Telephone Encounter (Signed)
 Medication sent to pharmacy

## 2024-01-26 ENCOUNTER — Ambulatory Visit: Payer: Self-pay | Admitting: Gastroenterology

## 2024-02-15 ENCOUNTER — Other Ambulatory Visit: Payer: Self-pay | Admitting: Student

## 2024-02-15 DIAGNOSIS — I10 Essential (primary) hypertension: Secondary | ICD-10-CM

## 2024-02-15 NOTE — Telephone Encounter (Signed)
 Medication sent to pharmacy

## 2024-02-21 ENCOUNTER — Other Ambulatory Visit: Payer: Self-pay | Admitting: Internal Medicine

## 2024-03-19 ENCOUNTER — Other Ambulatory Visit: Payer: Self-pay | Admitting: Internal Medicine

## 2024-03-22 ENCOUNTER — Telehealth: Payer: Self-pay | Admitting: Internal Medicine

## 2024-03-22 MED ORDER — REPATHA SURECLICK 140 MG/ML ~~LOC~~ SOAJ: 140.0000 mg | SUBCUTANEOUS | 0 refills | Status: DC

## 2024-03-22 NOTE — Telephone Encounter (Signed)
*  STAT* If patient is at the pharmacy, call can be transferred to refill team.   1. Which medications need to be refilled? (please list name of each medication and dose if known) Evolocumab  (REPATHA  SURECLICK) 140 MG/ML SOAJ    2. Would you like to learn more about the convenience, safety, & potential cost savings by using the [facility]?    3. Are you open to using the [facility] (Type [facility]. ).   4. Which pharmacy/location (including street and city if local pharmacy) is medication to be sent to?  [facility] - [place], [place] - [address]     5. Do they need a 30 day or 90 day supply? 90 day

## 2024-04-02 ENCOUNTER — Other Ambulatory Visit: Payer: Self-pay | Admitting: Hematology and Oncology

## 2024-04-02 DIAGNOSIS — D72829 Elevated white blood cell count, unspecified: Secondary | ICD-10-CM

## 2024-04-13 NOTE — Telephone Encounter (Signed)
*  STAT* If patient is at the pharmacy, call can be transferred to refill team.   1. Which medications need to be refilled? (please list name of each medication and dose if known)   Evolocumab  (REPATHA  SURECLICK) 140 MG/ML SOAJ   2. Would you like to learn more about the convenience, safety, & potential cost savings by using the [facility]?   3. Are you open to using the [facility] (Type [facility]. ).  4. Which pharmacy/location (including street and city if local pharmacy) is medication to be sent to?  [facility] - [place], [place] - [address]   5. Do they need a 30 day or 90 day supply?    Patient stated his pharmacy had not received this refill and wants the prescription re-sent.  Patient stated he is completely out of this medication.  Patient has appointment scheduled on 12/10 with Dr. Santo.

## 2024-04-14 MED ORDER — REPATHA SURECLICK 140 MG/ML ~~LOC~~ SOAJ: 140.0000 mg | SUBCUTANEOUS | 3 refills | Status: AC

## 2024-04-14 NOTE — Addendum Note (Signed)
 Addended by: Deundra Bard K on: 04/14/2024 08:05 AM   Modules accepted: Orders

## 2024-04-27 ENCOUNTER — Ambulatory Visit (INDEPENDENT_AMBULATORY_CARE_PROVIDER_SITE_OTHER): Payer: Self-pay | Admitting: Student

## 2024-04-27 ENCOUNTER — Encounter: Payer: Self-pay | Admitting: Student

## 2024-04-27 VITALS — BP 134/83 | HR 90 | Temp 97.7°F | Ht 66.0 in | Wt 156.8 lb

## 2024-04-27 DIAGNOSIS — Z0001 Encounter for general adult medical examination with abnormal findings: Secondary | ICD-10-CM

## 2024-04-27 DIAGNOSIS — E782 Mixed hyperlipidemia: Secondary | ICD-10-CM

## 2024-04-27 DIAGNOSIS — G629 Polyneuropathy, unspecified: Secondary | ICD-10-CM

## 2024-04-27 DIAGNOSIS — Z0000 Encounter for general adult medical examination without abnormal findings: Secondary | ICD-10-CM

## 2024-04-27 DIAGNOSIS — I739 Peripheral vascular disease, unspecified: Secondary | ICD-10-CM

## 2024-04-27 DIAGNOSIS — D473 Essential (hemorrhagic) thrombocythemia: Secondary | ICD-10-CM

## 2024-04-27 DIAGNOSIS — Z23 Encounter for immunization: Secondary | ICD-10-CM

## 2024-04-27 MED ORDER — GABAPENTIN 300 MG PO CAPS: 600.0000 mg | ORAL_CAPSULE | Freq: Three times a day (TID) | ORAL | 4 refills | Status: AC

## 2024-04-27 NOTE — Assessment & Plan Note (Signed)
Flu shot done today.

## 2024-04-27 NOTE — Progress Notes (Addendum)
 Preventative Visit   Patient: Brandon Lara, Male    DOB: [birth year], 63 y.o.   MRN: [id number]  Subjective  No chief complaint on file.   Brandon Lara is a 63 y.o. male who presents today for his preventative visit. He reports consuming a general diet. The patient has a physically strenuous job, but has no regular exercise apart from work.  He generally feels well. He reports sleeping well. He does have additional problems to discuss today.  He received his flu vaccine today and also followed up his preventative care paperwork for his job.   Vision:Follows up with [facility] .   Allergies  Allergen Reactions   Zetia  [Ezetimibe ] Other (See Comments)    Pain, aching joints/muscles   Crestor  [Rosuvastatin ] Other (See Comments)    Pain, aching joints/muscles   Lipitor [Atorvastatin ] Other (See Comments)    Pain, aching joints/muscles   Penicillins Rash      Medications: Outpatient Medications Prior to Visit  Medication Sig   aspirin  EC 81 MG tablet Take 81 mg by mouth daily. Swallow whole.   b complex vitamins capsule Take 1 capsule by mouth daily.   Evolocumab  (REPATHA  SURECLICK) 140 MG/ML SOAJ Inject 140 mg into the skin every 14 (fourteen) days.   gabapentin  (NEURONTIN ) 300 MG capsule Take 2 capsules (600 mg total) by mouth 3 (three) times daily.   hydrochlorothiazide  (MICROZIDE ) 12.5 MG capsule Take 1 capsule by mouth once daily   hydroxyurea  (HYDREA ) 500 MG capsule Take 2 capsules by mouth once daily   Multiple Vitamin (MULTIVITAMIN WITH MINERALS) TABS tablet Take 1 tablet by mouth daily. (Patient not taking: Reported on 11/11/2023)   polyethylene glycol (MIRALAX ) 17 g packet Take 17 g by mouth daily.   No facility-administered medications prior to visit.    Allergies  Allergen Reactions   Zetia  [Ezetimibe ] Other (See Comments)    Pain, aching joints/muscles   Crestor  [Rosuvastatin ] Other (See Comments)    Pain, aching joints/muscles   Lipitor  [Atorvastatin ] Other (See Comments)    Pain, aching joints/muscles   Penicillins Rash    Patient Care Team: Brandon Homans, MD as PCP - General Brandon Lara LABOR, MD as PCP - Cardiology (Cardiology)      Objective  There were no vitals taken for this visit.    Physical Exam Constitutional:      Appearance: Normal appearance.  HENT:     Head: Normocephalic and atraumatic.  Cardiovascular:     Rate and Rhythm: Normal rate and regular rhythm.     Pulses: Normal pulses.     Heart sounds: Normal heart sounds.  Skin:    General: Skin is warm and dry.  Neurological:     Mental Status: He is alert.     Most recent depression screenings:    08/10/2022    9:01 AM 07/21/2021    9:47 AM  PHQ 2/9 Scores  PHQ - 2 Score 0 0  PHQ- 9 Score 0       Data saved with a previous flowsheet row definition    Functional Status Activities of Daily Living (to include ambulation/medication): Independent Ambulation: Independent Medication Administration: Independent Home Management: Independent    Vision/Hearing Screen: No results found.     No results found for any visits on 04/27/24.    Assessment & Plan   Annual wellness visit done today including the all of the following: Reviewed patient's Family and Medical History Reviewed and updated list of patient's medical providers  Assessment of cognitive impairment was done Assessed patient's functional ability Established a written schedule for health screening services Health Risk Assessent Completed and Reviewed  Exercise Activities and Dietary recommendations  Goals   None     Immunization History  Administered Date(s) Administered   Influenza, Seasonal, Injecte, Preservative Fre 03/29/2023   Influenza,inj,Quad PF,6+ Mos 02/24/2022   Influenza-Unspecified 06/05/2021    Health Maintenance  Topic Date Due   COVID-19 Vaccine (1) Never done   DTaP/Tdap/Td (1 - Tdap) Never done   Pneumococcal Vaccine: 50+ Years  (1 of 2 - PCV) Never done   Zoster Vaccines- Shingrix (1 of 2) Never done   Influenza Vaccine  01/07/2024   Colonoscopy  11/24/2024   Hepatitis C Screening  Completed   HIV Screening  Completed   Hepatitis B Vaccines 19-59 Average Risk  Aged Out   HPV VACCINES  Aged Out   Meningococcal B Vaccine  Aged Out     Discussed health benefits of physical activity, and encouraged him to engage in regular exercise appropriate for his age and condition.    Problem List Items Addressed This Visit   None  Care Gaps - Lung Cancer Screening  - Referral sent to : [facility] [address] [place], [place] [address] [phone]   Brandon Grow, MD

## 2024-04-27 NOTE — Patient Instructions (Addendum)
 Thank you, [name] for allowing us  to provide your care today. Today we discussed your prior preventative health.  We will not be making any medication changes today.  Follow-up with your specialists as discussed  I refilled the gabapentin  as discussed.   [facility] [address] [place], [place] [address] Opens at 9am +1 [phone]  I have ordered the following labs for you:  Lab Orders  No laboratory test(s) ordered today     Tests ordered today:    Referrals ordered today:   Referral Orders  No referral(s) requested today     I have ordered the following medication/changed the following medications:   Stop the following medications: Medications Discontinued During This Encounter  Medication Reason   gabapentin  (NEURONTIN ) 300 MG capsule Reorder     Start the following medications: Meds ordered this encounter  Medications   gabapentin  (NEURONTIN ) 300 MG capsule    Sig: Take 2 capsules (600 mg total) by mouth 3 (three) times daily.    Dispense:  180 capsule    Refill:  4     Follow up: 3-4 months    Should you have any questions or concerns please call the internal medicine clinic at [phone].   Drue Lisa Grow MD 04/27/2024, 3:51 PM   [facility]

## 2024-04-28 ENCOUNTER — Encounter: Payer: Self-pay | Admitting: Vascular Surgery

## 2024-04-28 ENCOUNTER — Encounter: Payer: Self-pay | Admitting: Internal Medicine

## 2024-05-01 ENCOUNTER — Encounter: Payer: Self-pay | Admitting: Internal Medicine

## 2024-05-02 ENCOUNTER — Ambulatory Visit
Admission: RE | Admit: 2024-05-02 | Discharge: 2024-05-02 | Disposition: A | Discharge status: Home / Self Care | Source: Ambulatory Visit | Attending: Internal Medicine | Admitting: Internal Medicine

## 2024-05-02 DIAGNOSIS — Z0000 Encounter for general adult medical examination without abnormal findings: Secondary | ICD-10-CM

## 2024-05-11 ENCOUNTER — Ambulatory Visit: Payer: Self-pay | Admitting: Student

## 2024-05-17 ENCOUNTER — Ambulatory Visit: Attending: Internal Medicine | Admitting: Internal Medicine

## 2024-05-17 VITALS — BP 126/79 | HR 67 | Ht 66.0 in | Wt 157.4 lb

## 2024-05-17 DIAGNOSIS — I739 Peripheral vascular disease, unspecified: Secondary | ICD-10-CM

## 2024-05-17 DIAGNOSIS — I700 Atherosclerosis of aorta: Secondary | ICD-10-CM

## 2024-05-17 DIAGNOSIS — I10 Essential (primary) hypertension: Secondary | ICD-10-CM

## 2024-05-17 NOTE — Progress Notes (Signed)
 Cardiology Office Note:  .    Date:  05/17/2024  ID:  Brandon Lara, DOB [birth year], MRN [id number] PCP: Renne Homans, MD  [facility] Providers Cardiologist:  Stanly DELENA Leavens, MD     CC: CAC (new) and secondary prevention  History of Present Illness: .    Brandon Lara is a 63 y.o. male with hypertension, hyperlipidemia, and peripheral arterial disease who presents for secondary prevention evaluation.  He reports no symptoms and has not noticed any premature ventricular contractions (PVCs). He is on aspirin  and Repatha , maintaining his LDL cholesterol under 50 mg/dL.  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 He missed a dose in October due to a prescription mix-up but resumed in November and has had two doses since then, with no complaints.  He underwent surgery, after which he experienced difficulty urinating, leading to a 10-day hospital stay. Post-surgery, he was placed on potassium, which caused constipation, but his condition improved afterward. He has gained approximately 25 pounds, which he attributes to quitting smoking and increased activity levels.  He has a history of tobacco use and chronic obstructive pulmonary disease (COPD). He reports quitting smoking and drinking, although he occasionally drinks a few beers on Saturdays. He feels much better since quitting smoking and drinking, and since his surgery, he has been more active, engaging in yard work and other activities around the house.  He is followed for aortic atherosclerosis and peripheral arteriosclerosis. He also has moderate carotid artery stenosis. No chest pain or pressure is reported.  Discussed the use of AI scribe software for clinical note transcription with the patient, who gave verbal consent to proceed.   Relevant histories: .  Social  2022: established with me 2023 quit smoking 2024: Saw  Swinyer, new isolated PVC ROS: As per HPI.    Physical Exam:    VS:  BP 126/79 (BP Location: Right Arm, Patient Position: Sitting, Cuff Size: Normal)   Pulse 67   Ht 5' 6 (1.676 m)   Wt 157 lb 6.4 oz (71.4 kg)   SpO2 97%   BMI 25.41 kg/m    Wt Readings from Last 3 Encounters:  05/17/24 157 lb 6.4 oz (71.4 kg)  04/27/24 156 lb 12.8 oz (71.1 kg)  11/25/23 162 lb (73.5 kg)    Gen: no distress Cardiac: No Rubs or Gallops, no Murmur, RRR +2radial pulses Respiratory: Clear to auscultation bilaterally, normal effort, normal  respiratory rate GI: Soft, nontender, non-distended  MS: No  edema;  moves all extremities Integument: Skin feels warm Neuro:  At time of evaluation, alert and oriented to person/place/time/situation  Psych: Normal affect, patient feels ok     ASSESSMENT AND PLAN: .    Secondary prevention of atherosclerotic cardiovascular disease Asymptomatic with no evidence of congestive heart failure. Blood pressure is well-controlled. Recent CT scan shows plaque buildup on the left anterior descending artery. No current chest pain or pressure. 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 LDL levels are under 50, indicating effective cholesterol control. No complaints from Repatha  treatment. 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 No evidence of  hypertension during the visit.  PVCs - resolved, asymptomatic  Tobacco use, status post cessation Successfully quit smoking with no reported difficulty. Smoking cessation is a significant positive lifestyle change contributing to  cardiovascular health. - Continue to abstain from smoking  One year with me or my team  Longitudinal care: The evaluation and management services provided today reflect the complexity inherent in caring for this patient, including the ongoing longitudinal relationship and management of multiple chronic conditions and/or the need for care coordination. The visit required a comprehensive assessment and management plan tailored to the patient's unique needs Time was spent addressing not only the acute concerns but also the broader context of the patient's health, including preventive care, chronic disease management, and care coordination as appropriate.  Complex longitudinal is necessary for conditions including: secondary prevention with smoking cessation and PCSK9i therapy   Stanly Leavens, MD FASE [facility] Cardiologist [facility]  [address] [place], [address] [place], [place] [address] [phone]  8:47 AM

## 2024-05-17 NOTE — Progress Notes (Signed)
 Internal Medicine Clinic Attending  Case discussed with the resident at the time of the visit.  We reviewed the resident's history and exam and pertinent patient test results.  I agree with the assessment, diagnosis, and plan of care documented in the resident's note.

## 2024-05-17 NOTE — Patient Instructions (Signed)
 Medication Instructions:  Your physician recommends that you continue on your current medications as directed. Please refer to the Current Medication list given to you today.  *If you need a refill on your cardiac medications before your next appointment, please call your pharmacy*  Lab Work: NONE  If you have labs (blood work) drawn today and your tests are completely normal, you will receive your results only by: MyChart Message (if you have MyChart) OR A paper copy in the mail If you have any lab test that is abnormal or we need to change your treatment, we will call you to review the results.  Testing/Procedures: NONE  Follow-Up: At [facility], you and your health needs are our priority.  As part of our continuing mission to provide you with exceptional heart care, our providers are all part of one team.  This team includes your primary Cardiologist (physician) and Advanced Practice Providers or APPs (Physician Assistants and Nurse Practitioners) who all work together to provide you with the care you need, when you need it.  Your next appointment:   1 year(s)  Provider:   Stanly DELENA Leavens, MD or One of our Advanced Practice Providers (APPs): Morse Clause, PA-C  Lamarr Satterfield, NP Miriam Shams, NP  Olivia Pavy, PA-C Josefa Beauvais, NP  Leontine Salen, PA-C Orren Fabry, PA-C  Hao Meng, PA-C Ernest Dick, NP  Damien Braver, NP Jon Hails, PA-C  Taylor Parcells, PA-C    Dayna Dunn, PA-C  Scott Weaver, PA-C Lum Louis, NP Katlyn West, NP Callie Goodrich, PA-C  Xika Zhao, NP Sheng Haley, PA-C    Kathleen Johnson, PA-C

## 2024-06-08 ENCOUNTER — Other Ambulatory Visit: Payer: Self-pay | Admitting: Student

## 2024-06-08 DIAGNOSIS — I10 Essential (primary) hypertension: Secondary | ICD-10-CM

## 2024-06-09 NOTE — Telephone Encounter (Signed)
 Medication sent to pharmacy

## 2024-07-03 ENCOUNTER — Other Ambulatory Visit: Payer: Self-pay | Admitting: Hematology and Oncology

## 2024-07-03 DIAGNOSIS — D72829 Elevated white blood cell count, unspecified: Secondary | ICD-10-CM

## 2024-07-04 ENCOUNTER — Other Ambulatory Visit: Payer: Self-pay | Admitting: Vascular Surgery

## 2024-07-04 DIAGNOSIS — I6523 Occlusion and stenosis of bilateral carotid arteries: Secondary | ICD-10-CM

## 2024-07-04 DIAGNOSIS — I739 Peripheral vascular disease, unspecified: Secondary | ICD-10-CM

## 2024-08-22 ENCOUNTER — Ambulatory Visit (HOSPITAL_COMMUNITY)

## 2024-08-22 ENCOUNTER — Ambulatory Visit
